# Patient Record
Sex: Female | Born: 1965 | Race: White | Hispanic: No | Smoking: Former smoker
Health system: Southern US, Community
[De-identification: ages and names within clinical notes are randomized; demographics above are authoritative.]

## PROBLEM LIST (undated history)

## (undated) DIAGNOSIS — R569 Unspecified convulsions: Secondary | ICD-10-CM

## (undated) DIAGNOSIS — C801 Malignant (primary) neoplasm, unspecified: Secondary | ICD-10-CM

## (undated) DIAGNOSIS — F419 Anxiety disorder, unspecified: Secondary | ICD-10-CM

## (undated) DIAGNOSIS — E7800 Pure hypercholesterolemia, unspecified: Secondary | ICD-10-CM

## (undated) DIAGNOSIS — R7303 Prediabetes: Secondary | ICD-10-CM

## (undated) DIAGNOSIS — H539 Unspecified visual disturbance: Secondary | ICD-10-CM

## (undated) DIAGNOSIS — R55 Syncope and collapse: Secondary | ICD-10-CM

## (undated) DIAGNOSIS — E663 Overweight: Secondary | ICD-10-CM

## (undated) HISTORY — DX: Unspecified visual disturbance: H53.9

## (undated) HISTORY — DX: Pure hypercholesterolemia, unspecified: E78.00

## (undated) HISTORY — DX: Malignant (primary) neoplasm, unspecified: C80.1

## (undated) HISTORY — DX: Overweight: E66.3

## (undated) HISTORY — DX: Anxiety disorder, unspecified: F41.9

## (undated) HISTORY — DX: Syncope and collapse: R55

## (undated) HISTORY — PX: BACK SURGERY: SHX140

## (undated) HISTORY — PX: FINGER SURGERY: SHX640

---

## 1999-05-07 ENCOUNTER — Other Ambulatory Visit: Admission: RE | Admit: 1999-05-07 | Discharge: 1999-05-07 | Payer: Self-pay | Admitting: Obstetrics and Gynecology

## 1999-06-27 ENCOUNTER — Ambulatory Visit (HOSPITAL_COMMUNITY): Admission: RE | Admit: 1999-06-27 | Discharge: 1999-06-27 | Payer: Self-pay | Admitting: Psychology

## 1999-06-27 ENCOUNTER — Encounter: Payer: Self-pay | Admitting: Obstetrics and Gynecology

## 1999-07-29 ENCOUNTER — Encounter: Payer: Self-pay | Admitting: Obstetrics and Gynecology

## 1999-07-29 ENCOUNTER — Ambulatory Visit (HOSPITAL_COMMUNITY): Admission: RE | Admit: 1999-07-29 | Discharge: 1999-07-29 | Payer: Self-pay | Admitting: Obstetrics and Gynecology

## 1999-11-27 ENCOUNTER — Inpatient Hospital Stay (HOSPITAL_COMMUNITY): Admission: AD | Admit: 1999-11-27 | Discharge: 1999-11-29 | Payer: Self-pay | Admitting: Obstetrics and Gynecology

## 1999-12-02 ENCOUNTER — Encounter: Admission: RE | Admit: 1999-12-02 | Discharge: 2000-01-27 | Payer: Self-pay | Admitting: Obstetrics and Gynecology

## 2001-01-06 ENCOUNTER — Other Ambulatory Visit: Admission: RE | Admit: 2001-01-06 | Discharge: 2001-01-06 | Payer: Self-pay | Admitting: Obstetrics and Gynecology

## 2001-02-23 ENCOUNTER — Ambulatory Visit (HOSPITAL_COMMUNITY): Admission: RE | Admit: 2001-02-23 | Discharge: 2001-02-23 | Payer: Self-pay | Admitting: Obstetrics and Gynecology

## 2001-02-23 ENCOUNTER — Encounter: Payer: Self-pay | Admitting: Obstetrics and Gynecology

## 2001-05-04 ENCOUNTER — Ambulatory Visit (HOSPITAL_COMMUNITY): Admission: RE | Admit: 2001-05-04 | Discharge: 2001-05-04 | Payer: Self-pay | Admitting: Obstetrics and Gynecology

## 2001-05-04 ENCOUNTER — Encounter: Payer: Self-pay | Admitting: Obstetrics and Gynecology

## 2001-06-11 ENCOUNTER — Ambulatory Visit (HOSPITAL_COMMUNITY): Admission: RE | Admit: 2001-06-11 | Discharge: 2001-06-11 | Payer: Self-pay | Admitting: Obstetrics and Gynecology

## 2001-06-11 ENCOUNTER — Encounter: Payer: Self-pay | Admitting: Obstetrics and Gynecology

## 2001-07-21 ENCOUNTER — Inpatient Hospital Stay (HOSPITAL_COMMUNITY): Admission: AD | Admit: 2001-07-21 | Discharge: 2001-07-24 | Payer: Self-pay | Admitting: Obstetrics and Gynecology

## 2002-05-10 ENCOUNTER — Other Ambulatory Visit: Admission: RE | Admit: 2002-05-10 | Discharge: 2002-05-10 | Payer: Self-pay | Admitting: Obstetrics and Gynecology

## 2004-03-08 ENCOUNTER — Other Ambulatory Visit: Admission: RE | Admit: 2004-03-08 | Discharge: 2004-03-08 | Payer: Self-pay | Admitting: Obstetrics and Gynecology

## 2004-12-25 ENCOUNTER — Ambulatory Visit: Payer: Self-pay | Admitting: Pulmonary Disease

## 2005-06-19 ENCOUNTER — Other Ambulatory Visit: Admission: RE | Admit: 2005-06-19 | Discharge: 2005-06-19 | Payer: Self-pay | Admitting: Obstetrics and Gynecology

## 2006-01-01 ENCOUNTER — Ambulatory Visit: Payer: Self-pay | Admitting: Pulmonary Disease

## 2007-01-06 ENCOUNTER — Ambulatory Visit: Payer: Self-pay | Admitting: Pulmonary Disease

## 2008-03-30 DIAGNOSIS — F411 Generalized anxiety disorder: Secondary | ICD-10-CM | POA: Insufficient documentation

## 2008-03-31 ENCOUNTER — Encounter: Payer: Self-pay | Admitting: Adult Health

## 2008-03-31 ENCOUNTER — Ambulatory Visit: Payer: Self-pay | Admitting: Internal Medicine

## 2008-03-31 DIAGNOSIS — E663 Overweight: Secondary | ICD-10-CM | POA: Insufficient documentation

## 2008-04-04 ENCOUNTER — Ambulatory Visit: Payer: Self-pay | Admitting: Adult Health

## 2008-04-05 LAB — CONVERTED CEMR LAB
ALT: 45 units/L — ABNORMAL HIGH (ref 0–35)
AST: 32 units/L (ref 0–37)
Albumin: 4.5 g/dL (ref 3.5–5.2)
Alkaline Phosphatase: 85 units/L (ref 39–117)
BUN: 12 mg/dL (ref 6–23)
Basophils Absolute: 0 10*3/uL (ref 0.0–0.1)
Basophils Relative: 0.3 % (ref 0.0–1.0)
Bilirubin, Direct: 0.1 mg/dL (ref 0.0–0.3)
CO2: 25 meq/L (ref 19–32)
Calcium: 9.5 mg/dL (ref 8.4–10.5)
Chloride: 105 meq/L (ref 96–112)
Creatinine, Ser: 0.6 mg/dL (ref 0.4–1.2)
Eosinophils Absolute: 0.1 10*3/uL (ref 0.0–0.7)
Eosinophils Relative: 1.4 % (ref 0.0–5.0)
GFR calc Af Amer: 142 mL/min
GFR calc non Af Amer: 118 mL/min
Glucose, Bld: 90 mg/dL (ref 70–99)
HCT: 43.6 % (ref 36.0–46.0)
Hemoglobin: 14.7 g/dL (ref 12.0–15.0)
Lymphocytes Relative: 27.1 % (ref 12.0–46.0)
MCHC: 30.3 g/dL (ref 30.0–36.0)
MCV: 90 fL (ref 78.0–100.0)
Monocytes Absolute: 0.4 10*3/uL (ref 0.1–1.0)
Monocytes Relative: 7.6 % (ref 3.0–12.0)
Neutro Abs: 3.7 10*3/uL (ref 1.4–7.7)
Neutrophils Relative %: 63.6 % (ref 43.0–77.0)
Platelets: 237 10*3/uL (ref 150–400)
Potassium: 3.9 meq/L (ref 3.5–5.1)
RBC: 4.85 M/uL (ref 3.87–5.11)
RDW: 12.3 % (ref 11.5–14.6)
Sodium: 135 meq/L (ref 135–145)
TSH: 1.4 microintl units/mL (ref 0.35–5.50)
Total Bilirubin: 0.8 mg/dL (ref 0.3–1.2)
Total Protein: 8 g/dL (ref 6.0–8.3)
Vit D, 1,25-Dihydroxy: 24 — ABNORMAL LOW (ref 30–89)
WBC: 5.7 10*3/uL (ref 4.5–10.5)

## 2008-11-22 ENCOUNTER — Encounter (INDEPENDENT_AMBULATORY_CARE_PROVIDER_SITE_OTHER): Payer: Self-pay | Admitting: *Deleted

## 2009-03-16 ENCOUNTER — Ambulatory Visit: Payer: Self-pay | Admitting: Pulmonary Disease

## 2009-04-13 ENCOUNTER — Ambulatory Visit: Payer: Self-pay | Admitting: Pulmonary Disease

## 2009-04-14 LAB — CONVERTED CEMR LAB
ALT: 22 units/L (ref 0–35)
AST: 20 units/L (ref 0–37)
Albumin: 4.2 g/dL (ref 3.5–5.2)
Alkaline Phosphatase: 52 units/L (ref 39–117)
BUN: 10 mg/dL (ref 6–23)
Basophils Absolute: 0 10*3/uL (ref 0.0–0.1)
Basophils Relative: 0.1 % (ref 0.0–3.0)
Bilirubin Urine: NEGATIVE
Bilirubin, Direct: 0.2 mg/dL (ref 0.0–0.3)
CO2: 28 meq/L (ref 19–32)
Calcium: 9 mg/dL (ref 8.4–10.5)
Chloride: 108 meq/L (ref 96–112)
Cholesterol: 185 mg/dL (ref 0–200)
Creatinine, Ser: 0.6 mg/dL (ref 0.4–1.2)
Eosinophils Absolute: 0.1 10*3/uL (ref 0.0–0.7)
Eosinophils Relative: 1.3 % (ref 0.0–5.0)
GFR calc non Af Amer: 116.76 mL/min (ref >60)
Glucose, Bld: 93 mg/dL (ref 70–99)
HCT: 40.9 % (ref 36.0–46.0)
HDL: 41.9 mg/dL (ref >39.00)
Hemoglobin, Urine: NEGATIVE
Hemoglobin: 14.3 g/dL (ref 12.0–15.0)
Ketones, ur: NEGATIVE mg/dL
LDL Cholesterol: 121 mg/dL — ABNORMAL HIGH (ref 0–99)
Leukocytes, UA: NEGATIVE
Lymphocytes Relative: 21.7 % (ref 12.0–46.0)
Lymphs Abs: 1.3 10*3/uL (ref 0.7–4.0)
MCHC: 34.8 g/dL (ref 30.0–36.0)
MCV: 89.1 fL (ref 78.0–100.0)
Monocytes Absolute: 0.5 10*3/uL (ref 0.1–1.0)
Monocytes Relative: 8.5 % (ref 3.0–12.0)
Neutro Abs: 4.2 10*3/uL (ref 1.4–7.7)
Neutrophils Relative %: 68.4 % (ref 43.0–77.0)
Nitrite: NEGATIVE
Platelets: 184 10*3/uL (ref 150.0–400.0)
Potassium: 3.3 meq/L — ABNORMAL LOW (ref 3.5–5.1)
RBC: 4.59 M/uL (ref 3.87–5.11)
RDW: 11.9 % (ref 11.5–14.6)
Sodium: 141 meq/L (ref 135–145)
Specific Gravity, Urine: <=1.005 (ref 1.000–1.030)
TSH: 1.07 microintl units/mL (ref 0.35–5.50)
Total Bilirubin: 0.9 mg/dL (ref 0.3–1.2)
Total CHOL/HDL Ratio: 4
Total Protein, Urine: NEGATIVE mg/dL
Total Protein: 7.4 g/dL (ref 6.0–8.3)
Triglycerides: 111 mg/dL (ref 0.0–149.0)
Urine Glucose: NEGATIVE mg/dL
Urobilinogen, UA: 0.2 (ref 0.0–1.0)
VLDL: 22.2 mg/dL (ref 0.0–40.0)
WBC: 6.1 10*3/uL (ref 4.5–10.5)
pH: 7 (ref 5.0–8.0)

## 2009-11-05 ENCOUNTER — Telehealth: Payer: Self-pay | Admitting: Pulmonary Disease

## 2010-01-04 ENCOUNTER — Encounter: Admission: RE | Admit: 2010-01-04 | Discharge: 2010-01-04 | Payer: Self-pay | Admitting: Obstetrics and Gynecology

## 2010-07-05 ENCOUNTER — Ambulatory Visit: Payer: Self-pay | Admitting: Pulmonary Disease

## 2010-07-05 DIAGNOSIS — E785 Hyperlipidemia, unspecified: Secondary | ICD-10-CM | POA: Insufficient documentation

## 2010-07-06 DIAGNOSIS — E559 Vitamin D deficiency, unspecified: Secondary | ICD-10-CM | POA: Insufficient documentation
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
## 2011-01-15 ENCOUNTER — Telehealth: Payer: Self-pay | Admitting: Pulmonary Disease

## 2011-01-21 NOTE — Progress Notes (Signed)
Summary: Alprazolam refill request  Phone Note Refill Request Message from:  Fax from Pharmacy on January 15, 2011 10:36 AM  Refills Requested: Medication #1:  ALPRAZOLAM 0.5 MG  TABS Take 1/2-1 tablet two times a day as needed.   Dosage confirmed as above?Dosage Confirmed   Brand Name Necessary? No   Supply Requested: 1 month   Last Refilled: 11/28/2010   Notes: [facility] patient [facility] [place]  ([place]) [phone]   Method Requested: Telephone to Pharmacy Next Appointment Scheduled: 03/07/2011 w/ SN Initial call taken by: Michel Bickers CMA,  January 15, 2011 10:37 AM  Follow-up for Phone Call        Please advise if okay to send refills.Michel Bickers CMA  January 15, 2011 10:38 AM    Prescriptions: ALPRAZOLAM 0.5 MG  TABS (ALPRAZOLAM) Take 1/2-1 tablet two times a day as needed  #100 x 5   Entered by:   Randell Loop CMA   Authorized by:   Michele Mcalpine MD   Signed by:   Randell Loop CMA on 01/15/2011   Method used:   Historical   RxID:   [id number]  rx has been faxed back to the pharmacy with 5 refills Randell Loop [facility]  January 15, 2011 12:50 PM
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
## 2011-03-28 NOTE — Op Note (Signed)
[facility]  Patient:    Misty Howell, Misty Howell Visit Number: [id number] MRN: [id number]          Service Type: OBS Location: 910B 9161 01 Attending Physician:  Oliver Pila Dictated by:   Alvino Chapel, M.D. Proc. Date: 07/21/01 Admit Date:  07/21/2001                             Operative Report  PREOPERATIVE DIAGNOSES:       1. Term pregnancy at 39 weeks.                               2. Failed induction.                               3. Suspected macrosomia.                               4. Transverse lie of fetus with elbow                                  presenting.  POSTOPERATIVE DIAGNOSES:      1. Term pregnancy at 39 weeks.                               2. Failed induction.                               3. Suspected macrosomia.                               4. Transverse lie of fetus with elbow                                  presenting.  PROCEDURE:                    Low transverse cesarean section with                               extension T of the uterus to a classical                               incision.  SURGEON:                      Alvino Chapel, M.D.  ANESTHESIA:                   Epidural.  FINDINGS:                     The infant was in a very compound presentation with the elbow presenting and the head and upper torso flexed upwards to the maternal right. As the torso could not be deflexed to pull the head to incision and the feet and bottom were not reachable to effect a  breech delivery, the uterus was T-ed and the head delivered in the flexed position. It was a female infant, 9 pounds 12 ounces. Apgars were 3 and 8 and he responded well to blow by oxygen with pediatrics present. Normal tubes and ovaries were noted. There were two small fibroids on the posterior surface of the uterus.  ESTIMATED BLOOD LOSS:         800 cc.  INTRAVENOUS FLUIDS:           2000 cc.  URINE OUTPUT:                  Approximately 100 cc clear urine.  DESCRIPTION OF PROCEDURE:     The patient was taken to the operating room after vaginal exam revealed a presenting arm and ultrasound confirmed the baby in a transverse lie with an elbow presenting. At that time, she was 3-4 cm dilated. She was prepped and draped in the normal sterile fashion and epidural found to be adequate by Allis clamp test. A Pfannenstiel skin incision was then made through the skin and carried through the underlying layer of fascia by sharp dissection and Bovie cautery. The fascia was then nicked in the midline and the incision was extended laterally with Mayo scissors. The inferior aspect of the incision was grasped with Kocher clamps, elevated, and dissected off the underlying rectus muscles. In a similar fashion, the superior aspect was elevated and dissected off the underlying rectus muscles. The rectus muscles were separated in the midline and the peritoneum was opened. The peritoneal incision was then extended both superiorly and inferiorly with careful attention to avoid both bowel and bladder. The bladder blade was then inserted. The vesicouterine peritoneum was identified and the bladder flap created with Metzenbaum scissors. The uterus was then incised in a transverse fashion and an arm introduced into the uterus to assess fetal position. As described, the fetus was very difficult to manipulate with the upper torso flexed upward to the maternal right. This could not be deflexed to effect delivery of the head after three minutes of trying; therefore, the uterine incision was extended in the midline vertically and at that point, the head was able to be delivered from its position. The remainder of the infant delivered, cord was clamped and cut, and the infant handed to the awaiting pediatricians. Cord blood was obtained and a cord gas was attempted. The placenta was then delivered without difficulty. The uterus was cleared  of all clots and debris with a moist lap sponge. The uterus was then exteriorized and the incision edges carefully demarcated with ring forceps. The classical vertical portion of the uterine incision was then closed in a multi-layer approach of 0 chromic in a running locked fashion in two separate layers. A third layer of 2-0 Vicryl was placed in the peritoneal surface to bury the chromic suture. The transverse portion of the incision was then closed in the usual fashion in a running locked fashion of 0 chromic. There was a small area of bleeding noted in the right angle and this was controlled with two figure-of-eight sutures. The incision was then found to be hemostatic and the uterus was returned to the abdomen. It was again inspected and irrigation of the abdomen performed with the uterus in its anatomical position. All clots and debris were removed from the abdomen and the uterus was well contracted and the incision was hemostatic; therefore, the rectus muscles were reapproximated with an interrupted stitch of 1-0 chromic and the fascia  was closed with O Vicryl in a running fashion. The subcutaneous tissue was reapproximated with a running suture of 2-0 Vicryl and the skin was closed with staples. Sponge lap and needle counts were correct x 2 and the patient was taken to the recovery room in stable condition. Dictated by:   Alvino Chapel, M.D. Attending Physician:  Oliver Pila DD:  07/21/01 TD:  07/22/01 Job: [id number]

## 2011-03-28 NOTE — Discharge Summary (Signed)
[facility]  PatientKAMA Howell                       MRN: [id number] Adm. Date:  [id number] Disc. Date: [id number] Attending:  Michaele Offer                           Discharge Summary  DISCHARGE DIAGNOSES:          1. Term pregnancy at 39+ weeks, delivered.                               2. Thick meconium in labor.                               3. Group B Streptococcus-positive status.                               4. Status post low forceps vaginal delivery.  DISCHARGE MEDICATIONS:        1. Motrin 600 mg p.o. every six hours.                               2. Colace 100 mg p.o. b.i.d.                               3. Prenatal vitamins 1 p.o. daily.  FOLLOW-UP:                    The patient is to follow up in our office in approximately six weeks for her routine postpartum exam.  HOSPITAL COURSE:              The patient is a 45 year old G2, P0-0-1-0, who is  admitted at 39-6/7 weeks with regular contractions every two to three minutes. She had no leakage of fluid, no vaginal bleeding, and good fetal movement.  Her pregnancy had been uncomplicated except for rubella nonimmune status as well as  positive group B strep culture.  Prenatal laboratories were solid at A positive, antibody negative, VDRL negative, rubella nonimmune.  Hepatitis B surface antigen negative.  HIV negative.  GC negative, chlamydia negative.  Group B strep positive.  PAST OBSTETRICAL HISTORY:     Therapeutic abortion at approximately six weeks previously.  PAST GYNECOLOGICAL HISTORY:   History of cryotherapy in 1994.  PAST SURGICAL HISTORY:        Hand surgery at 45 years old.  PAST MEDICAL HISTORY:         History of broken back at age 33 in a sledding                               accident.  ALLERGIES:                    No known drug allergies.  MEDICATIONS:                  Prenatal vitamins only.  PHYSICAL EXAMINATION:         On admission  she was afebrile with stable vital signs.  Fetal  heart rate was reactive.  Cervical exam on admission was 80% effaced, 1 cm dilated, with a bulging bag.  The patient was very uncomfortable with her contractions, therefore, was admitted admitted for pain control and group B strep prophylaxis.  HOSPITAL COURSE:              The patient had assisted rupture of membranes after admission, with thick meconium noted.  An amniotic infusion was begun through an intrauterine pressure catheter.  She did receive penicillin prophylaxis for her  group B strep status.  She continued to progress well, and reached complete dilation, +1 station.  The patient then pushed for about 2-1/2 hours with progression of station to completely dilated, completely effaced, and +2, with  direct OA presentation.  Fetal heart rate was noted to be tachycardic, but was reassuring overall, with some variables down to the 80s for approximately 10 minutes prior to the decision to proceed with a low forceps vaginal delivery. he patient had a mild maternal temperature of 100.3.  The decision was made to proceed with low forceps vaginal delivery given maternal exhaustion and the tachycardic  fetal heart rate with deepening variables.  The Luikart forceps were easily applied to the OA position, and the infant was delivered in two easy pulls.  There was  nuchal cord x 1 which was reduced easily.  The infant was DeLee suctioned for thick meconium.  Apgars were 6 and 9, weight was 8 pounds 1 ounce, vigorous female infant. There was a third degree perineal laceration which was repaired with 2-0 and 3-0 Vicryl.  Cervix and rectum were intact.  Estimated blood loss was 450 cc. Pediatrics was present at delivery to evaluate the baby, and a cord pH obtained at the time of delivery was 7.26.  The patient was then admitted with routine postpartum care.  She did well, and on postpartum day #2 she was afebrile, with  stable  vital signs.  Her pain was controlled with Motrin.  Therefore, she was discharged to home, with follow-up as previously stated. DD:  12/14/99 TD:  12/15/99 Job: [id number]

## 2011-07-07 ENCOUNTER — Ambulatory Visit (INDEPENDENT_AMBULATORY_CARE_PROVIDER_SITE_OTHER): Payer: Self-pay | Admitting: Pulmonary Disease

## 2011-07-07 ENCOUNTER — Encounter: Payer: Self-pay | Admitting: Pulmonary Disease

## 2011-07-07 DIAGNOSIS — F411 Generalized anxiety disorder: Secondary | ICD-10-CM

## 2011-07-07 DIAGNOSIS — Z0000 Encounter for general adult medical examination without abnormal findings: Secondary | ICD-10-CM

## 2011-07-07 DIAGNOSIS — E663 Overweight: Secondary | ICD-10-CM

## 2011-07-07 DIAGNOSIS — E559 Vitamin D deficiency, unspecified: Secondary | ICD-10-CM

## 2011-07-07 DIAGNOSIS — E785 Hyperlipidemia, unspecified: Secondary | ICD-10-CM

## 2011-07-07 MED ORDER — ALPRAZOLAM 0.5 MG PO TABS: ORAL_TABLET | ORAL | Status: DC
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
## 2011-07-07 NOTE — Patient Instructions (Signed)
Today we updated your med list in EPIC...    We refilled your Alprazolam as requested...  Please return to our lab one morning soon for your follow up fasting blood work...    Then call the [facility] in a few days for your results...    Dial [id number] & when prompted enter your patient number followed by the # symbol...    Your patient number is:   [id number]  Call for any problems...  Let's plan a follow up visit in 1 year, sooner if needed for any reason.Marland KitchenMarland Kitchen

## 2012-02-23 ENCOUNTER — Telehealth: Payer: Self-pay | Admitting: Pulmonary Disease

## 2012-02-23 MED ORDER — ALPRAZOLAM 0.5 MG PO TABS: ORAL_TABLET | ORAL | Status: DC

## 2012-02-23 NOTE — Telephone Encounter (Signed)
Refill sent. Pt is aware. Tinzley Dalia, CMA  

## 2012-04-07 ENCOUNTER — Ambulatory Visit (INDEPENDENT_AMBULATORY_CARE_PROVIDER_SITE_OTHER): Payer: Self-pay | Admitting: Pulmonary Disease

## 2012-04-07 ENCOUNTER — Encounter: Payer: Self-pay | Admitting: Pulmonary Disease

## 2012-04-07 VITALS — BP 116/84 | HR 88 | Temp 97.0°F | Ht 64.0 in | Wt 180.2 lb

## 2012-04-07 DIAGNOSIS — F411 Generalized anxiety disorder: Secondary | ICD-10-CM

## 2012-04-07 DIAGNOSIS — E785 Hyperlipidemia, unspecified: Secondary | ICD-10-CM

## 2012-04-07 DIAGNOSIS — E663 Overweight: Secondary | ICD-10-CM

## 2012-04-07 MED ORDER — ALPRAZOLAM 0.5 MG PO TABS: ORAL_TABLET | ORAL | Status: DC
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
## 2012-04-07 NOTE — Patient Instructions (Signed)
Today we updated your med list in our EPIC system...    Continue your current medications the same...    We refilled your alprazolam as requested...  For your Calcium supplement> try the little choc square VIACTIV daily...  At your convenience> please return to our lab one morning for your FASTING blood work...    We will call you w/ the results...  Let's get on track w/ our diet & exercise program...    Call for any problems.Marland KitchenMarland Kitchen

## 2012-10-21 ENCOUNTER — Other Ambulatory Visit: Payer: Self-pay | Admitting: Pulmonary Disease

## 2012-10-26 ENCOUNTER — Telehealth: Payer: Self-pay | Admitting: Pulmonary Disease

## 2012-10-26 ENCOUNTER — Other Ambulatory Visit: Payer: Self-pay | Admitting: Pulmonary Disease

## 2012-10-26 NOTE — Telephone Encounter (Signed)
Alprazolam 0.5mg  <> take 1/2 to 1 talbet by mouth 2 times a day as needed  #60 x5 Last fill 09-21-2012 No Known Allergies Dr Kriste Basque is this ok to fill  Thank you

## 2012-10-26 NOTE — Telephone Encounter (Signed)
Refill sent in on 10-2012

## 2012-10-26 NOTE — Telephone Encounter (Signed)
Dup.closed out first one by mistake   Alprazolam 0.5 mg <> 1/2 to 1 tablet by mouth 2 times a day as needed  #60 X5 Last fill 09-21-12 No Known Allergies Dr Kriste Basque please advise  Thank you

## 2012-11-30 ENCOUNTER — Ambulatory Visit: Payer: Self-pay | Admitting: Pulmonary Disease

## 2013-01-05 ENCOUNTER — Other Ambulatory Visit: Payer: Self-pay | Admitting: Pulmonary Disease

## 2013-01-07 ENCOUNTER — Telehealth: Payer: Self-pay | Admitting: Pulmonary Disease

## 2013-01-07 MED ORDER — ALPRAZOLAM 0.5 MG PO TABS: ORAL_TABLET | ORAL | Status: DC

## 2013-01-07 NOTE — Telephone Encounter (Signed)
rx has been called in. Nothing further was needed

## 2013-02-03 ENCOUNTER — Ambulatory Visit (INDEPENDENT_AMBULATORY_CARE_PROVIDER_SITE_OTHER): Payer: No Typology Code available for payment source | Admitting: Pulmonary Disease

## 2013-02-03 ENCOUNTER — Encounter: Payer: Self-pay | Admitting: Pulmonary Disease

## 2013-02-03 VITALS — BP 120/70 | HR 77 | Temp 97.0°F | Ht 64.0 in | Wt 198.8 lb

## 2013-02-03 DIAGNOSIS — E785 Hyperlipidemia, unspecified: Secondary | ICD-10-CM

## 2013-02-03 DIAGNOSIS — E663 Overweight: Secondary | ICD-10-CM

## 2013-02-03 DIAGNOSIS — F411 Generalized anxiety disorder: Secondary | ICD-10-CM

## 2013-02-03 DIAGNOSIS — E559 Vitamin D deficiency, unspecified: Secondary | ICD-10-CM

## 2013-02-03 MED ORDER — ALPRAZOLAM 0.5 MG PO TABS: ORAL_TABLET | ORAL | Status: DC
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
## 2013-02-03 NOTE — Patient Instructions (Addendum)
Today we updated your med list in our EPIC system...    Continue your current medications the same...    We refilled your alprazolam per your request...  Please consider scheduling a CPX complete w/ CXR, EKG, & FASTING blood work at [facility]...  Call for any questions...  Let's plan a follow up visit in 45mo, sooner if needed for problems.Marland KitchenMarland Kitchen

## 2013-06-24 ENCOUNTER — Encounter: Payer: Self-pay | Admitting: Pulmonary Disease

## 2013-09-23 ENCOUNTER — Other Ambulatory Visit: Payer: Self-pay | Admitting: Pulmonary Disease

## 2013-09-26 ENCOUNTER — Telehealth: Payer: Self-pay | Admitting: Pulmonary Disease

## 2013-09-26 MED ORDER — ALPRAZOLAM 0.5 MG PO TABS: ORAL_TABLET | ORAL | Status: DC

## 2013-09-26 NOTE — Telephone Encounter (Signed)
I have called RX into the [facility] in [place]. Pt is aware nothing needed

## 2013-11-14 ENCOUNTER — Ambulatory Visit: Payer: Self-pay | Admitting: Pulmonary Disease

## 2013-11-24 ENCOUNTER — Emergency Department (HOSPITAL_COMMUNITY): Payer: No Typology Code available for payment source

## 2013-11-24 ENCOUNTER — Observation Stay (HOSPITAL_COMMUNITY)
Admission: EM | Admit: 2013-11-24 | Discharge: 2013-11-24 | Disposition: A | Discharge status: Home / Self Care | Payer: No Typology Code available for payment source | Attending: Internal Medicine | Admitting: Internal Medicine

## 2013-11-24 ENCOUNTER — Encounter (HOSPITAL_COMMUNITY): Payer: Self-pay | Admitting: Emergency Medicine

## 2013-11-24 DIAGNOSIS — E663 Overweight: Secondary | ICD-10-CM | POA: Insufficient documentation

## 2013-11-24 DIAGNOSIS — F411 Generalized anxiety disorder: Secondary | ICD-10-CM | POA: Insufficient documentation

## 2013-11-24 DIAGNOSIS — R569 Unspecified convulsions: Secondary | ICD-10-CM

## 2013-11-24 DIAGNOSIS — Z87891 Personal history of nicotine dependence: Secondary | ICD-10-CM | POA: Insufficient documentation

## 2013-11-24 DIAGNOSIS — R55 Syncope and collapse: Secondary | ICD-10-CM | POA: Diagnosis present

## 2013-11-24 DIAGNOSIS — E7800 Pure hypercholesterolemia, unspecified: Secondary | ICD-10-CM | POA: Insufficient documentation

## 2013-11-24 DIAGNOSIS — R404 Transient alteration of awareness: Principal | ICD-10-CM | POA: Insufficient documentation

## 2013-11-24 DIAGNOSIS — E559 Vitamin D deficiency, unspecified: Secondary | ICD-10-CM | POA: Insufficient documentation

## 2013-11-24 LAB — URINALYSIS, ROUTINE W REFLEX MICROSCOPIC
Glucose, UA: NEGATIVE mg/dL
Hgb urine dipstick: NEGATIVE
Ketones, ur: 40 mg/dL — AB
Leukocytes, UA: NEGATIVE
NITRITE: NEGATIVE
PH: 5 (ref 5.0–8.0)
Protein, ur: 100 mg/dL — AB
SPECIFIC GRAVITY, URINE: 1.028 (ref 1.005–1.030)
Urobilinogen, UA: 1 mg/dL (ref 0.0–1.0)

## 2013-11-24 LAB — CBC
HEMATOCRIT: 44.5 % (ref 36.0–46.0)
HEMOGLOBIN: 15.7 g/dL — AB (ref 12.0–15.0)
MCH: 31.4 pg (ref 26.0–34.0)
MCHC: 35.3 g/dL (ref 30.0–36.0)
MCV: 89 fL (ref 78.0–100.0)
Platelets: 250 10*3/uL (ref 150–400)
RBC: 5 MIL/uL (ref 3.87–5.11)
RDW: 12.6 % (ref 11.5–15.5)
WBC: 9.2 10*3/uL (ref 4.0–10.5)

## 2013-11-24 LAB — POCT I-STAT, CHEM 8
BUN: 14 mg/dL (ref 6–23)
CHLORIDE: 100 meq/L (ref 96–112)
Calcium, Ion: 1.14 mmol/L (ref 1.12–1.23)
Creatinine, Ser: 0.8 mg/dL (ref 0.50–1.10)
Glucose, Bld: 210 mg/dL — ABNORMAL HIGH (ref 70–99)
HEMATOCRIT: 50 % — AB (ref 36.0–46.0)
Hemoglobin: 17 g/dL — ABNORMAL HIGH (ref 12.0–15.0)
Potassium: 3.9 mEq/L (ref 3.7–5.3)
SODIUM: 136 meq/L — AB (ref 137–147)
TCO2: 19 mmol/L (ref 0–100)

## 2013-11-24 LAB — COMPREHENSIVE METABOLIC PANEL
ALK PHOS: 102 U/L (ref 39–117)
ALT: 18 U/L (ref 0–35)
AST: 16 U/L (ref 0–37)
Albumin: 4.7 g/dL (ref 3.5–5.2)
BILIRUBIN TOTAL: 0.6 mg/dL (ref 0.3–1.2)
BUN: 14 mg/dL (ref 6–23)
CHLORIDE: 91 meq/L — AB (ref 96–112)
CO2: 17 meq/L — AB (ref 19–32)
Calcium: 9.7 mg/dL (ref 8.4–10.5)
Creatinine, Ser: 0.67 mg/dL (ref 0.50–1.10)
GFR calc Af Amer: >90 mL/min (ref >90)
GLUCOSE: 209 mg/dL — AB (ref 70–99)
POTASSIUM: 4 meq/L (ref 3.7–5.3)
Sodium: 135 mEq/L — ABNORMAL LOW (ref 137–147)
Total Protein: 8.9 g/dL — ABNORMAL HIGH (ref 6.0–8.3)

## 2013-11-24 LAB — RAPID URINE DRUG SCREEN, HOSP PERFORMED
Amphetamines: NOT DETECTED
BARBITURATES: NOT DETECTED
Benzodiazepines: POSITIVE — AB
Cocaine: NOT DETECTED
Opiates: NOT DETECTED
TETRAHYDROCANNABINOL: NOT DETECTED

## 2013-11-24 LAB — DIFFERENTIAL
Basophils Absolute: 0 10*3/uL (ref 0.0–0.1)
Basophils Relative: 0 % (ref 0–1)
EOS ABS: 0 10*3/uL (ref 0.0–0.7)
Eosinophils Relative: 0 % (ref 0–5)
Lymphocytes Relative: 23 % (ref 12–46)
Lymphs Abs: 2.1 10*3/uL (ref 0.7–4.0)
MONO ABS: 0.8 10*3/uL (ref 0.1–1.0)
MONOS PCT: 8 % (ref 3–12)
Neutro Abs: 6.3 10*3/uL (ref 1.7–7.7)
Neutrophils Relative %: 69 % (ref 43–77)

## 2013-11-24 LAB — POCT I-STAT TROPONIN I: TROPONIN I, POC: 0 ng/mL (ref 0.00–0.08)

## 2013-11-24 LAB — PROTIME-INR
INR: 1.05 (ref 0.00–1.49)
Prothrombin Time: 13.5 seconds (ref 11.6–15.2)

## 2013-11-24 LAB — URINE MICROSCOPIC-ADD ON

## 2013-11-24 LAB — TROPONIN I: Troponin I: <0.3 ng/mL (ref <0.30)

## 2013-11-24 LAB — ETHANOL

## 2013-11-24 LAB — APTT: aPTT: 24 seconds (ref 24–37)

## 2013-11-24 LAB — GLUCOSE, CAPILLARY: Glucose-Capillary: 173 mg/dL — ABNORMAL HIGH (ref 70–99)

## 2013-11-24 MED ORDER — GADOBENATE DIMEGLUMINE 529 MG/ML IV SOLN
15.0000 mL | Freq: Once | INTRAVENOUS | Status: AC
  Administered 2013-11-24: 14 mL via INTRAVENOUS

## 2013-11-24 MED ORDER — GADOBENATE DIMEGLUMINE 529 MG/ML IV SOLN: 15.0000 mL | Freq: Once | INTRAVENOUS | Status: DC | PRN

## 2013-11-24 NOTE — Procedures (Signed)
ELECTROENCEPHALOGRAM REPORT   Patient: Misty Howell       Room #: C27 EEG No. ID: [id number] Age: 48 y.o.        Sex: female Referring Physician: Adele Barthel Report Date:  11/24/2013        Interpreting Physician: Alexis Goodell D  History: ETHNE JEON is an 48 y.o. female with new onset seizure  Medications:  ALPRAZolam (XANAX) 0.5 MG tablet, Take 0.25-0.5 mg by mouth 2 (two) times daily as needed for anxiety., Disp: , Rfl: ;   calcium carbonate (OS-CAL) 600 MG TABS, Take 600 mg by mouth daily.  , Disp: , Rfl: ;   cholecalciferol (VITAMIN D) 1000 UNITS tablet, Take 1,000 Units by mouth daily.  , Disp: , Rfl: ;   Multiple Vitamins-Minerals (MULTIVITAMIN & MINERAL PO), Take 1 tablet by mouth daily.  , Disp: , Rfl:   Conditions of Recording:  This is a 16 channel EEG carried out with the patient in the awake and anxious state.  Description:  The waking background activity consists of a low voltage, symmetrical, fairly well organized, 10 Hz alpha activity, seen from the parieto-occipital and posterior temporal regions.  Low voltage fast activity, poorly organized, is seen anteriorly and is at times superimposed on more posterior regions.  A mixture of theta and alpha rhythms are seen from the central and temporal regions. The patient does not drowse or sleep. Hyperventilation produced a mild to moderate buildup but failed to elicit any abnormalities.  Intermittent photic stimulation was performed but failed to illicit any change in the tracing.    IMPRESSION: Normal awake electroencephalogram with activation procedures. There are no focal lateralizing or epileptiform features.  Comment:  An EEG with the patient sleep deprived to elicit drowse and light sleep may be desirable to further elicit a possible seizure disorder.     Alexis Goodell, MD [facility] [phone] 11/24/2013, 4:28 PM

## 2013-11-24 NOTE — ED Provider Notes (Signed)
Neurologist, Dr. Doy Mince request for pt to have further work up to r/o seizure.  She felt pt may need brain MRI, EEG along with seizure work up.  Will relay message to Dr. Curly Rim.    Domenic Moras, PA-C 11/24/13 1546

## 2013-11-24 NOTE — Consult Note (Signed)
Requesting physician: Elmer Sow, EDP  Primary Care Physician: No primary provider on file.  Reason for consultation: Syncope, ? seizure   History of Present Illness: 48 y/o woman with past medical history significant only for anxiety disorder for which he has been prescribed Xanax for the past 2-3 years of which she takes 0.5-1 mg every day. She presents to the hospital today as a code stroke. It appears she was at work when one of her coworkers heard her fall to the floor in the cubicle next to him. He found her shaking and went to get another coworker, by the time they returned about 3 minutes later she was found to be still and not responding to stimuli. It took her about an hour to 2 hours to return to her normal. She gives the history is that she has not been taking her Xanax over the past 3 days but had been taking routinely 0.5-1 mg every day for the past 2-3 years. She has been seen in consultation by neurology who recommended an EEG and an MRI that have already been completed. EDP  requested a hospitalist consultation for potential admission.  Allergies:  No Known Allergies    Past Medical History  Diagnosis Date  . Hypercholesterolemia   . Overweight   . Vitamin D deficiency   . Anxiety     Past Surgical History  Procedure Laterality Date  . Back surgery      Scheduled Meds:  Continuous Infusions:  PRN Meds:.gadobenate dimeglumine  Social History:  reports that she quit smoking about 25 years ago. She does not have any smokeless tobacco history on file. She reports that she drinks alcohol. She reports that she does not use illicit drugs.  Family History  Problem Relation Age of Onset  . Hypertension Mother   . Migraines Mother   . Hypertension Father     Review of Systems:  Constitutional: Denies fever, chills, diaphoresis, appetite change and fatigue.  HEENT: Denies photophobia, eye pain, redness, hearing loss, ear pain, congestion, sore throat, rhinorrhea,  sneezing, mouth sores, trouble swallowing, neck pain, neck stiffness and tinnitus.   Respiratory: Denies SOB, DOE, cough, chest tightness,  and wheezing.   Cardiovascular: Denies chest pain, palpitations and leg swelling.  Gastrointestinal: Denies nausea, vomiting, abdominal pain, diarrhea, constipation, blood in stool and abdominal distention.  Genitourinary: Denies dysuria, urgency, frequency, hematuria, flank pain and difficulty urinating.  Endocrine: Denies: hot or cold intolerance, sweats, changes in hair or nails, polyuria, polydipsia. Musculoskeletal: Denies myalgias, back pain, joint swelling, arthralgias and gait problem.  Skin: Denies pallor, rash and wound.  Neurological: Denies dizziness, weakness, light-headedness, numbness and headaches.  Hematological: Denies adenopathy. Easy bruising, personal or family bleeding history  Psychiatric/Behavioral: Denies suicidal ideation, mood changes, confusion, nervousness, sleep disturbance and agitation   Physical Exam: Blood pressure 135/95, pulse 88, temperature 97.5 F (36.4 C), resp. rate 20, last menstrual period 11/07/2013, SpO2 99.00%. General: Alert, awake, oriented x3, in no distress. HEENT: Normocephalic, atraumatic, pupils equal round and reactive to light, extraocular movements intact. Neck: Supple, no JVD, no lymphadenopathy, no bruits, no goiter. Cardiovascular: Regular rate and rhythm, I do not auscultate any murmurs, rubs or gallops. Lungs: Clear to auscultation bilaterally. Abdomen: Soft, nontender, nondistended, positive bowel sounds, no masses or organomegaly noted. Extremities: No clubbing, cyanosis or edema, positive pedal pulses. Neurologic: Currently intact and nonfocal.  Labs on Admission:  Results for orders placed during the hospital encounter of 11/24/13 (from the past 48 hour(s))  ETHANOL  Status: None   Collection Time    11/24/13 10:40 AM      Result Value Range   Alcohol, Ethyl (B) <11  0 - 11 mg/dL    Comment:            LOWEST DETECTABLE LIMIT FOR     SERUM ALCOHOL IS 11 mg/dL     FOR MEDICAL PURPOSES ONLY  PROTIME-INR     Status: None   Collection Time    11/24/13 10:40 AM      Result Value Range   Prothrombin Time 13.5  11.6 - 15.2 seconds   INR 1.05  0.00 - 1.49  APTT     Status: None   Collection Time    11/24/13 10:40 AM      Result Value Range   aPTT 24  24 - 37 seconds  CBC     Status: Abnormal   Collection Time    11/24/13 10:40 AM      Result Value Range   WBC 9.2  4.0 - 10.5 K/uL   RBC 5.00  3.87 - 5.11 MIL/uL   Hemoglobin 15.7 (*) 12.0 - 15.0 g/dL   HCT 44.5  36.0 - 46.0 %   MCV 89.0  78.0 - 100.0 fL   MCH 31.4  26.0 - 34.0 pg   MCHC 35.3  30.0 - 36.0 g/dL   RDW 12.6  11.5 - 15.5 %   Platelets 250  150 - 400 K/uL  DIFFERENTIAL     Status: None   Collection Time    11/24/13 10:40 AM      Result Value Range   Neutrophils Relative % 69  43 - 77 %   Neutro Abs 6.3  1.7 - 7.7 K/uL   Lymphocytes Relative 23  12 - 46 %   Lymphs Abs 2.1  0.7 - 4.0 K/uL   Monocytes Relative 8  3 - 12 %   Monocytes Absolute 0.8  0.1 - 1.0 K/uL   Eosinophils Relative 0  0 - 5 %   Eosinophils Absolute 0.0  0.0 - 0.7 K/uL   Basophils Relative 0  0 - 1 %   Basophils Absolute 0.0  0.0 - 0.1 K/uL  COMPREHENSIVE METABOLIC PANEL     Status: Abnormal   Collection Time    11/24/13 10:40 AM      Result Value Range   Sodium 135 (*) 137 - 147 mEq/L   Potassium 4.0  3.7 - 5.3 mEq/L   Chloride 91 (*) 96 - 112 mEq/L   CO2 17 (*) 19 - 32 mEq/L   Glucose, Bld 209 (*) 70 - 99 mg/dL   BUN 14  6 - 23 mg/dL   Creatinine, Ser 0.67  0.50 - 1.10 mg/dL   Calcium 9.7  8.4 - 10.5 mg/dL   Total Protein 8.9 (*) 6.0 - 8.3 g/dL   Albumin 4.7  3.5 - 5.2 g/dL   AST 16  0 - 37 U/L   ALT 18  0 - 35 U/L   Alkaline Phosphatase 102  39 - 117 U/L   Total Bilirubin 0.6  0.3 - 1.2 mg/dL   GFR calc non Af Amer >90  >90 mL/min   GFR calc Af Amer >90  >90 mL/min   Comment: (NOTE)     The eGFR has been  calculated using the CKD EPI equation.     This calculation has not been validated in all clinical situations.     eGFR's persistently <90  mL/min signify possible Chronic Kidney     Disease.  TROPONIN I     Status: None   Collection Time    11/24/13 10:40 AM      Result Value Range   Troponin I <0.30  <0.30 ng/mL   Comment:            Due to the release kinetics of cTnI,     a negative result within the first hours     of the onset of symptoms does not rule out     myocardial infarction with certainty.     If myocardial infarction is still suspected,     repeat the test at appropriate intervals.  POCT I-STAT TROPONIN I     Status: None   Collection Time    11/24/13 10:44 AM      Result Value Range   Troponin i, poc 0.00  0.00 - 0.08 ng/mL   Comment 3            Comment: Due to the release kinetics of cTnI,     a negative result within the first hours     of the onset of symptoms does not rule out     myocardial infarction with certainty.     If myocardial infarction is still suspected,     repeat the test at appropriate intervals.  POCT I-STAT, CHEM 8     Status: Abnormal   Collection Time    11/24/13 10:46 AM      Result Value Range   Sodium 136 (*) 137 - 147 mEq/L   Potassium 3.9  3.7 - 5.3 mEq/L   Chloride 100  96 - 112 mEq/L   BUN 14  6 - 23 mg/dL   Creatinine, Ser 0.80  0.50 - 1.10 mg/dL   Glucose, Bld 210 (*) 70 - 99 mg/dL   Calcium, Ion 1.14  1.12 - 1.23 mmol/L   TCO2 19  0 - 100 mmol/L   Hemoglobin 17.0 (*) 12.0 - 15.0 g/dL   HCT 50.0 (*) 36.0 - 46.0 %  URINE RAPID DRUG SCREEN (HOSP PERFORMED)     Status: Abnormal   Collection Time    11/24/13 11:02 AM      Result Value Range   Opiates NONE DETECTED  NONE DETECTED   Cocaine NONE DETECTED  NONE DETECTED   Benzodiazepines POSITIVE (*) NONE DETECTED   Amphetamines NONE DETECTED  NONE DETECTED   Tetrahydrocannabinol NONE DETECTED  NONE DETECTED   Barbiturates NONE DETECTED  NONE DETECTED   Comment:             DRUG SCREEN FOR MEDICAL PURPOSES     ONLY.  IF CONFIRMATION IS NEEDED     FOR ANY PURPOSE, NOTIFY LAB     WITHIN 5 DAYS.                LOWEST DETECTABLE LIMITS     FOR URINE DRUG SCREEN     Drug Class       Cutoff (ng/mL)     Amphetamine      1000     Barbiturate      200     Benzodiazepine   235     Tricyclics       361     Opiates          300     Cocaine          300     THC  50  GLUCOSE, CAPILLARY     Status: Abnormal   Collection Time    11/24/13 11:17 AM      Result Value Range   Glucose-Capillary 173 (*) 70 - 99 mg/dL  URINALYSIS, ROUTINE W REFLEX MICROSCOPIC     Status: Abnormal   Collection Time    11/24/13 11:45 AM      Result Value Range   Color, Urine AMBER (*) YELLOW   Comment: BIOCHEMICALS MAY BE AFFECTED BY COLOR   APPearance CLOUDY (*) CLEAR   Specific Gravity, Urine 1.028  1.005 - 1.030   pH 5.0  5.0 - 8.0   Glucose, UA NEGATIVE  NEGATIVE mg/dL   Hgb urine dipstick NEGATIVE  NEGATIVE   Bilirubin Urine SMALL (*) NEGATIVE   Ketones, ur 40 (*) NEGATIVE mg/dL   Protein, ur 100 (*) NEGATIVE mg/dL   Urobilinogen, UA 1.0  0.0 - 1.0 mg/dL   Nitrite NEGATIVE  NEGATIVE   Leukocytes, UA NEGATIVE  NEGATIVE  URINE MICROSCOPIC-ADD ON     Status: Abnormal   Collection Time    11/24/13 11:45 AM      Result Value Range   Squamous Epithelial / LPF RARE  RARE   WBC, UA 0-2  <3 WBC/hpf   RBC / HPF 0-2  <3 RBC/hpf   Bacteria, UA FEW (*) RARE   Casts GRANULAR CAST (*) NEGATIVE   Urine-Other AMORPHOUS URATES/PHOSPHATES     Comment: MUCOUS PRESENT    Radiological Exams on Admission: Ct Head Wo Contrast  11/24/2013   CLINICAL DATA:  Code stroke.  Word Salad.  EXAM: CT HEAD WITHOUT CONTRAST  TECHNIQUE: Contiguous axial images were obtained from the base of the skull through the vertex without intravenous contrast.  COMPARISON:  None.  FINDINGS: No acute cortical infarct, hemorrhage, or mass lesion is present. The ventricles are of normal size. No significant  extra-axial fluid collection is evident. The paranasal sinuses and mastoid air cells are clear. The osseous skull is intact.  IMPRESSION: Negative CT of the head.  These results were called by telephone at the time of interpretation on 11/24/2013 at 10:38 AM to Dr. Doy Mince, who verbally acknowledged these results.   Electronically Signed   By: Lawrence Santiago M.D.   On: 11/24/2013 10:39   Mr Angiogram Neck W Wo Contrast  11/24/2013   CLINICAL DATA:  59-YEAR-OLD female with seizure and fall at work. Altered mental status. Headache and photophobia.  EXAM: MRI HEAD WITHOUT AND WITH CONTRAST  MRA NECK WITHOUT AND WITH CONTRAST  TECHNIQUE: Multiplanar, multiecho pulse sequences of the brain and surrounding structures were obtained without and with intravenous contrast. Angiographic images of the neck were obtained using MRA technique without and with contrast.  CONTRAST:  38m MULTIHANCE GADOBENATE DIMEGLUMINE 529 MG/ML IV SOLN  COMPARISON:  Head CT without contrast 11/24/2013.  FINDINGS: MRI HEAD FINDINGS  Cerebral volume is normal. No restricted diffusion to suggest acute infarction. No midline shift, mass effect, evidence of mass lesion, ventriculomegaly, extra-axial collection or acute intracranial hemorrhage. Cervicomedullary junction and pituitary are within normal limits. Negative visualized cervical spine. Major intracranial vascular flow voids are preserved.  GPearline Cablesand white matter signal is within normal limits throughout the brain. Motion artifact on thin slice coronal images, but mesial temporal lobe structures appear symmetric and within normal limits. No abnormal enhancement identified.  Visualized orbit soft tissues are within normal limits. Visualized paranasal sinuses are clear. Trace left mastoid fluid. Negative visualized nasopharynx. Visualized scalp soft tissues are within normal  limits.  MRA NECK FINDINGS  Left thyroid T2 hyperintense nodule measuring up to 13 mm. No suspicious features identified.  In patients older than 35 years this does not require further evaluation. This follows ACR consensus guidelines: Managing Incidental Thyroid Nodules Detected on Imaging: White Paper of the ACR Incidental Thyroid Findings Committee. J AM Coll Radiol, Published On-line: September 10, 2013.  Precontrast time-of-flight images demonstrate antegrade flow in both cervical carotid and vertebral arteries.  Post contrast time-of-flight images. Three vessel arch configuration with normal great vessel origins.  Normal right CCA. Normal right carotid bifurcation. Normal cervical right ICA. Visible right anterior intracranial circulation appears grossly normal; fetal type right PCA origin suspected.  Normal left CCA except for mild proximal tortuosity. Normal left carotid bifurcation. Normal cervical left ICA. Visible left anterior intracranial circulation appears grossly normal.  Both vertebral artery origins are within normal limits. The cervical vertebral arteries are codominant and within normal limits. Visible posterior intracranial circulation remarkable for fenestration of the right V4 segment (normal anatomic variation) and otherwise within normal limits.  IMPRESSION: 1.  Normal MRI appearance of the brain. 2. Negative neck MRA.   Electronically Signed   By: Lars Pinks M.D.   On: 11/24/2013 16:28   Mr Jeri Cos QI Contrast  11/24/2013   CLINICAL DATA:  54-YEAR-OLD female with seizure and fall at work. Altered mental status. Headache and photophobia.  EXAM: MRI HEAD WITHOUT AND WITH CONTRAST  MRA NECK WITHOUT AND WITH CONTRAST  TECHNIQUE: Multiplanar, multiecho pulse sequences of the brain and surrounding structures were obtained without and with intravenous contrast. Angiographic images of the neck were obtained using MRA technique without and with contrast.  CONTRAST:  60mL MULTIHANCE GADOBENATE DIMEGLUMINE 529 MG/ML IV SOLN  COMPARISON:  Head CT without contrast 11/24/2013.  FINDINGS: MRI HEAD FINDINGS  Cerebral volume  is normal. No restricted diffusion to suggest acute infarction. No midline shift, mass effect, evidence of mass lesion, ventriculomegaly, extra-axial collection or acute intracranial hemorrhage. Cervicomedullary junction and pituitary are within normal limits. Negative visualized cervical spine. Major intracranial vascular flow voids are preserved.  Pearline Cables and white matter signal is within normal limits throughout the brain. Motion artifact on thin slice coronal images, but mesial temporal lobe structures appear symmetric and within normal limits. No abnormal enhancement identified.  Visualized orbit soft tissues are within normal limits. Visualized paranasal sinuses are clear. Trace left mastoid fluid. Negative visualized nasopharynx. Visualized scalp soft tissues are within normal limits.  MRA NECK FINDINGS  Left thyroid T2 hyperintense nodule measuring up to 13 mm. No suspicious features identified. In patients older than 35 years this does not require further evaluation. This follows ACR consensus guidelines: Managing Incidental Thyroid Nodules Detected on Imaging: White Paper of the ACR Incidental Thyroid Findings Committee. J AM Coll Radiol, Published On-line: September 10, 2013.  Precontrast time-of-flight images demonstrate antegrade flow in both cervical carotid and vertebral arteries.  Post contrast time-of-flight images. Three vessel arch configuration with normal great vessel origins.  Normal right CCA. Normal right carotid bifurcation. Normal cervical right ICA. Visible right anterior intracranial circulation appears grossly normal; fetal type right PCA origin suspected.  Normal left CCA except for mild proximal tortuosity. Normal left carotid bifurcation. Normal cervical left ICA. Visible left anterior intracranial circulation appears grossly normal.  Both vertebral artery origins are within normal limits. The cervical vertebral arteries are codominant and within normal limits. Visible posterior  intracranial circulation remarkable for fenestration of the right V4 segment (normal anatomic variation) and otherwise  within normal limits.  IMPRESSION: 1.  Normal MRI appearance of the brain. 2. Negative neck MRA.   Electronically Signed   By: Lars Pinks M.D.   On: 11/24/2013 16:28    Assessment/Plan Active Problems:   Seizure   Collapse   Syncopal event -Per coworker report, this seems to be a generalized tonic-clonic seizure with an appropriate postictal period. -She has already been assessed by Dr. Doy Mince with neurology, who recommended an MRI and EEG. -MRI and EEG have both been done and interpreted as within normal limits. -Discussed with Dr. Doy Mince who agrees that patient does not need to be admitted at this time as her workup is complete. -Antiseizure medications are certainly not indicated at this point either. 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 Patient has been instructed to not abruptly cease use of her Xanax. -She will need to be seen next week by her primary care physician to clear her for driving.  Anxiety disorder -Continue Xanax as prescribed by primary care physician. -Continue outpatient followup as scheduled.   Time Spent on Consultation: 75 minutes  [facility]  [phone] 11/24/2013, 4:44 PM

## 2013-11-24 NOTE — ED Notes (Signed)
Per EMS - pt found unresponsive on the floor by ems, LSN 0830. Pupils dilated. Very confused, word salad. Possible seizure hx when she was a child. No facial droop, no weakness. Pt c/o HA. BP 140/100, 138/98, HR 120 CBG 292. During transport pt would have moments of clarity but then go back to having word salad or talking about a cash registry.

## 2013-11-24 NOTE — Code Documentation (Signed)
48yo female arriving to [facility] via [place].  EMS reports that she was seen at her baseline at 0830 by her coworkers and then later found on the floor confused.  Patient taken to CT on arrival.  NIHSS 0 on arrival.  See code stroke log for times and documentation. Coworker reports that she was in her cubicle and heard a noise, went to the patient and found her jerking which lasted 2-3 minutes.  Coworker produced the patient's Xanax prescription bottle which was empty.  No acute stroke treatment at this time per Dr. Doy Mince.  Code stroke canceled at 1046.  Bedside handoff with ED RN  Lanelle Bal.

## 2013-11-24 NOTE — ED Provider Notes (Signed)
Agree with documentation.    Elmer Sow, MD 11/24/13 [phone]

## 2013-11-24 NOTE — ED Notes (Signed)
Main lab called, urine split out, need to recollect.

## 2013-11-24 NOTE — ED Notes (Signed)
Pt denies frequent headaches.

## 2013-11-24 NOTE — ED Notes (Signed)
Pt transported to EEG 

## 2013-11-24 NOTE — ED Provider Notes (Addendum)
CSN: [id number]     Arrival date & time 11/24/13  1022 History   First MD Initiated Contact with Patient 11/24/13 1028     Chief Complaint  Patient presents with  . Code Stroke   (Consider location/radiation/quality/duration/timing/severity/associated sxs/prior Treatment) HPI Comments: 48 yo wf with pmh of increased chol, anxiety, Vit D deficiency presents with collapse.  Pt was found down at work by [employer].  Pt works in [employer].    Pt does not recall any of the incident.  EMS was called and the pt was t/s to [facility] ED as a code stroke.  She was noted to have confusion and word salad en route.    10:57 AM Pt denies symptoms currently.    Pt was evaluated by Neuro on arrival.  Please see Dr. Doy Mince documentation.    Patient is a 48 y.o. female presenting with Acute Neurological Problem. The history is provided by the patient and the EMS personnel.  Cerebrovascular Accident This is a new problem. The current episode started 1 to 2 hours ago. The problem occurs constantly. The problem has been gradually improving. Associated symptoms include headaches. Pertinent negatives include no chest pain, no abdominal pain and no shortness of breath. Associated symptoms comments: Mild HA. Nothing aggravates the symptoms. Nothing relieves the symptoms. She has tried nothing (BG 292) for the symptoms.    Past Medical History  Diagnosis Date  . Hypercholesterolemia   . Overweight   . Vitamin D deficiency   . Anxiety    Past Surgical History  Procedure Laterality Date  . Back surgery     Family History  Problem Relation Age of Onset  . Hypertension Mother   . Migraines Mother   . Hypertension Father    History  Substance Use Topics  . Smoking status: Former Smoker -- 3 years    Quit date: 11/10/1988  . Smokeless tobacco: Not on file  . Alcohol Use: Yes     Comment: social   OB History   Grav Para Term Preterm Abortions TAB SAB Ect Mult Living                 Review of Systems   Constitutional: Negative.   Eyes: Negative.   Respiratory: Negative.  Negative for shortness of breath.   Cardiovascular: Negative for chest pain.  Gastrointestinal: Negative for abdominal pain.  Endocrine: Negative.   Genitourinary: Negative.   Neurological: Positive for headaches. Negative for tremors, seizures, syncope, speech difficulty, light-headedness and numbness.       Confusion.    Allergies  Review of patient's allergies indicates no known allergies.  Home Medications   Current Outpatient Rx  Name  Route  Sig  Dispense  Refill  . ALPRAZolam (XANAX) 0.5 MG tablet   Oral   Take 0.25-0.5 mg by mouth 2 (two) times daily as needed for anxiety.         . calcium carbonate (OS-CAL) 600 MG TABS   Oral   Take 600 mg by mouth daily.           . cholecalciferol (VITAMIN D) 1000 UNITS tablet   Oral   Take 1,000 Units by mouth daily.           . Multiple Vitamins-Minerals (MULTIVITAMIN & MINERAL PO)   Oral   Take 1 tablet by mouth daily.            BP 134/92  Pulse 96  Resp 22  SpO2 100%  LMP 11/07/2013 Physical Exam  Vitals reviewed. Constitutional: She is oriented to person, place, and time. She appears well-developed and well-nourished.  HENT:  Head: Normocephalic and atraumatic.  Eyes: Conjunctivae are normal. Right eye exhibits no discharge. Left eye exhibits no discharge.  Neck: Neck supple.  Cardiovascular: Normal rate and regular rhythm.   Pulmonary/Chest: Effort normal and breath sounds normal. No respiratory distress. She has no wheezes. She has no rales.  Abdominal: Soft. Bowel sounds are normal. She exhibits no distension and no mass. There is no tenderness. There is no rebound and no guarding.  Musculoskeletal: Normal range of motion. She exhibits no edema and no tenderness.  MAE, nttp  Neurological: She is alert and oriented to person, place, and time. She has normal reflexes. She displays normal reflexes. She exhibits normal muscle tone.   See neurology note for full neuro exam  Skin: Skin is warm and dry.    ED Course  Procedures (including critical care time) Labs Review Labs Reviewed  CBC - Abnormal; Notable for the following:    Hemoglobin 15.7 (*)    All other components within normal limits  COMPREHENSIVE METABOLIC PANEL - Abnormal; Notable for the following:    Sodium 135 (*)    Chloride 91 (*)    CO2 17 (*)    Glucose, Bld 209 (*)    Total Protein 8.9 (*)    All other components within normal limits  URINE RAPID DRUG SCREEN (HOSP PERFORMED) - Abnormal; Notable for the following:    Benzodiazepines POSITIVE (*)    All other components within normal limits  GLUCOSE, CAPILLARY - Abnormal; Notable for the following:    Glucose-Capillary 173 (*)    All other components within normal limits  URINALYSIS, ROUTINE W REFLEX MICROSCOPIC - Abnormal; Notable for the following:    Color, Urine AMBER (*)    APPearance CLOUDY (*)    Bilirubin Urine SMALL (*)    Ketones, ur 40 (*)    Protein, ur 100 (*)    All other components within normal limits  URINE MICROSCOPIC-ADD ON - Abnormal; Notable for the following:    Bacteria, UA FEW (*)    Casts GRANULAR CAST (*)    All other components within normal limits  POCT I-STAT, CHEM 8 - Abnormal; Notable for the following:    Sodium 136 (*)    Glucose, Bld 210 (*)    Hemoglobin 17.0 (*)    HCT 50.0 (*)    All other components within normal limits  ETHANOL  PROTIME-INR  APTT  DIFFERENTIAL  TROPONIN I  POCT I-STAT TROPONIN I   UHCG - not ordered; pt denies pregnancy  Imaging Review Ct Head Wo Contrast  11/24/2013   CLINICAL DATA:  Code stroke.  Word Salad.  EXAM: CT HEAD WITHOUT CONTRAST  TECHNIQUE: Contiguous axial images were obtained from the base of the skull through the vertex without intravenous contrast.  COMPARISON:  None.  FINDINGS: No acute cortical infarct, hemorrhage, or mass lesion is present. The ventricles are of normal size. No significant extra-axial  fluid collection is evident. The paranasal sinuses and mastoid air cells are clear. The osseous skull is intact.  IMPRESSION: Negative CT of the head.  These results were called by telephone at the time of interpretation on 11/24/2013 at 10:38 AM to Dr. Doy Mince, who verbally acknowledged these results.   Electronically Signed   By: Lawrence Santiago M.D.   On: 11/24/2013 10:39    EKG Interpretation    Date/Time:  Thursday November 24 2013  11:07:12 EST Ventricular Rate:  98 PR Interval:  150 QRS Duration: 88 QT Interval:  338 QTC Calculation: 431 R Axis:   96 Text Interpretation:  Sinus rhythm Borderline right axis deviation Low voltage, precordial leads Borderline T abnormalities, diffuse leads Confirmed by [facility]  MD, Icey Tello ([id number]) on 11/24/2013 12:30:58 PM Also confirmed by [facility]  MD, Joscelyn Hardrick [phone])  on 11/24/2013 1:01:38 PM           Results for orders placed during the hospital encounter of 11/24/13  ETHANOL      Result Value Range   Alcohol, Ethyl (B) <11  0 - 11 mg/dL  PROTIME-INR      Result Value Range   Prothrombin Time 13.5  11.6 - 15.2 seconds   INR 1.05  0.00 - 1.49  APTT      Result Value Range   aPTT 24  24 - 37 seconds  CBC      Result Value Range   WBC 9.2  4.0 - 10.5 K/uL   RBC 5.00  3.87 - 5.11 MIL/uL   Hemoglobin 15.7 (*) 12.0 - 15.0 g/dL   HCT 44.5  36.0 - 46.0 %   MCV 89.0  78.0 - 100.0 fL   MCH 31.4  26.0 - 34.0 pg   MCHC 35.3  30.0 - 36.0 g/dL   RDW 12.6  11.5 - 15.5 %   Platelets 250  150 - 400 K/uL  DIFFERENTIAL      Result Value Range   Neutrophils Relative % 69  43 - 77 %   Neutro Abs 6.3  1.7 - 7.7 K/uL   Lymphocytes Relative 23  12 - 46 %   Lymphs Abs 2.1  0.7 - 4.0 K/uL   Monocytes Relative 8  3 - 12 %   Monocytes Absolute 0.8  0.1 - 1.0 K/uL   Eosinophils Relative 0  0 - 5 %   Eosinophils Absolute 0.0  0.0 - 0.7 K/uL   Basophils Relative 0  0 - 1 %   Basophils Absolute 0.0  0.0 - 0.1 K/uL  COMPREHENSIVE METABOLIC PANEL      Result Value  Range   Sodium 135 (*) 137 - 147 mEq/L   Potassium 4.0  3.7 - 5.3 mEq/L   Chloride 91 (*) 96 - 112 mEq/L   CO2 17 (*) 19 - 32 mEq/L   Glucose, Bld 209 (*) 70 - 99 mg/dL   BUN 14  6 - 23 mg/dL   Creatinine, Ser 0.67  0.50 - 1.10 mg/dL   Calcium 9.7  8.4 - 10.5 mg/dL   Total Protein 8.9 (*) 6.0 - 8.3 g/dL   Albumin 4.7  3.5 - 5.2 g/dL   AST 16  0 - 37 U/L   ALT 18  0 - 35 U/L   Alkaline Phosphatase 102  39 - 117 U/L   Total Bilirubin 0.6  0.3 - 1.2 mg/dL   GFR calc non Af Amer >90  >90 mL/min   GFR calc Af Amer >90  >90 mL/min  TROPONIN I      Result Value Range   Troponin I <0.30  <0.30 ng/mL  URINE RAPID DRUG SCREEN (HOSP PERFORMED)      Result Value Range   Opiates NONE DETECTED  NONE DETECTED   Cocaine NONE DETECTED  NONE DETECTED   Benzodiazepines POSITIVE (*) NONE DETECTED   Amphetamines NONE DETECTED  NONE DETECTED   Tetrahydrocannabinol NONE DETECTED  NONE DETECTED   Barbiturates NONE DETECTED  NONE DETECTED  GLUCOSE, CAPILLARY      Result Value Range   Glucose-Capillary 173 (*) 70 - 99 mg/dL  URINALYSIS, ROUTINE W REFLEX MICROSCOPIC      Result Value Range   Color, Urine AMBER (*) YELLOW   APPearance CLOUDY (*) CLEAR   Specific Gravity, Urine 1.028  1.005 - 1.030   pH 5.0  5.0 - 8.0   Glucose, UA NEGATIVE  NEGATIVE mg/dL   Hgb urine dipstick NEGATIVE  NEGATIVE   Bilirubin Urine SMALL (*) NEGATIVE   Ketones, ur 40 (*) NEGATIVE mg/dL   Protein, ur 100 (*) NEGATIVE mg/dL   Urobilinogen, UA 1.0  0.0 - 1.0 mg/dL   Nitrite NEGATIVE  NEGATIVE   Leukocytes, UA NEGATIVE  NEGATIVE  URINE MICROSCOPIC-ADD ON      Result Value Range   Squamous Epithelial / LPF RARE  RARE   WBC, UA 0-2  <3 WBC/hpf   RBC / HPF 0-2  <3 RBC/hpf   Bacteria, UA FEW (*) RARE   Casts GRANULAR CAST (*) NEGATIVE   Urine-Other AMORPHOUS URATES/PHOSPHATES    POCT I-STAT, CHEM 8      Result Value Range   Sodium 136 (*) 137 - 147 mEq/L   Potassium 3.9  3.7 - 5.3 mEq/L   Chloride 100  96 - 112  mEq/L   BUN 14  6 - 23 mg/dL   Creatinine, Ser 0.80  0.50 - 1.10 mg/dL   Glucose, Bld 210 (*) 70 - 99 mg/dL   Calcium, Ion 1.14  1.12 - 1.23 mmol/L   TCO2 19  0 - 100 mmol/L   Hemoglobin 17.0 (*) 12.0 - 15.0 g/dL   HCT 50.0 (*) 36.0 - 46.0 %  POCT I-STAT TROPONIN I      Result Value Range   Troponin i, poc 0.00  0.00 - 0.08 ng/mL   Comment 3              MDM   1. Seizure   2.  LOC 3.  Collapse   48 year old white female presents emergency department after collapsing at work. Initially code stroke was called however neurology evaluated and no acute neurologic deficit was noted, but a stroke workup was initiated. Patient will need further evaluation for potential syncope/seizures/stroke.  On my exam patient was symptom free other than a mild headache. Her workup thus far has been unremarkable with the exception of mildly elevated blood glucose.  CT head was negative.  Case discussed with Dr. Doy Mince neurology. Plan for admission to hospitalist. Spoke with Dr. Jerilee Hoh who will admit patient.    4:48 PM Patient had a prolonged stay in the emergency department awaiting EEG, MRI, and continued observation.  MRI, MRA, and EEG unremarkable. Dr. Jerilee Hoh evaluated the patient and discussed the case at length with Dr. Doy Mince neurology. Joint decision with neurology, internal medicine, and myself to discharge patient home.  No evidence of emergent etiology at this time. Prolonged ER stay revealed no episodes. Patient is comfortable being discharge found. Driving restrictions were given and she should not drive until she is cleared by her primary care provider Dr. Lenna Gilford.  Strict ER return precautions were given and the patient agrees with the plan.  Pt is aware that [facility] was not ordered.  She adamantly denies any possibility of pregnancy.  LMP 3 wks ago.    At time of discharge patient has completely cleared normal sensorium. She is appropriate. She has no neurologic deficit. Normal speech  normal stance normal  gait.  Doubt stroke, TIA, grand mal seizure, ACS, cardiac syncope, intoxication, or other emergent issue.    Elmer Sow, MD 11/24/13 1657  Elmer Sow, MD 11/24/13 [phone]

## 2013-11-24 NOTE — Discharge Instructions (Signed)
Syncope Syncope means a person passes out (faints). The person usually wakes up in less than 5 minutes. It is important to seek medical care for syncope. HOME CARE  Have someone stay with you until you feel normal.  Do not drive, use machines, or play sports until your doctor says it is okay.  Keep all doctor visits as told.  Lie down when you feel like you might pass out. Take deep breaths. Wait until you feel normal before standing up.  Drink enough fluids to keep your pee (urine) clear or pale yellow.  If you take blood pressure or heart medicine, get up slowly. Take several minutes to sit and then stand. GET HELP RIGHT AWAY IF:   You have a severe headache.  You have pain in the chest, belly (abdomen), or back.  You are bleeding from the mouth or butt (rectum).  You have black or tarry poop (stool).  You have an irregular or very fast heartbeat.  You have pain with breathing.  You keep passing out, or you have shaking (seizures) when you pass out.  You pass out when sitting or lying down.  You feel confused.  You have trouble walking.  You have severe weakness.  You have vision problems. If you fainted, call your local emergency services (911 in [place] Do not drive yourself to the hospital. MAKE SURE YOU:   Understand these instructions.  Will watch your condition.  Will get help right away if you are not doing well or get worse. Document Released: 04/14/2008 Document Revised: 04/27/2012 Document Reviewed: 12/26/2011 [facility] Patient Information 2014 [place], [place].  Seizure, Adult A seizure means there is unusual activity in the brain. A seizure can cause changes in attention or behavior. Seizures often cause shaking (convulsions). Seizures often last from 30 seconds to 2 minutes. HOME CARE   If you are given medicines, take them exactly as told by your doctor.  Keep all doctor visits as told.  Do not swim or drive until your doctor says it is  okay.  Teach others what to do if you have a seizure. They should:  Lay you on the ground.  Put a cushion under your head.  Loosen any tight clothing around your neck.  Turn you on your side.  Stay with you until you get better. GET HELP RIGHT AWAY IF:   The seizure lasts longer than 2 to 5 minutes.  The seizure is very bad.  The person does not wake up after the seizure.  The person's attention or behavior changes. Drive the person to the emergency room or call your local emergency services (911 in [place] MAKE SURE YOU:   Understand these instructions.  Will watch your condition.  Will get help right away if you are not doing well or get worse. Document Released: 04/14/2008 Document Revised: 01/19/2012 Document Reviewed: 10/15/2011 [facility] Patient Information 2014 [place].  DO NOT DRIVE, SWIM, OR WORK FROM HEIGHTS UNTIL CLEARED BY YOUR PRIMARY CARE PROVIDER.

## 2013-11-24 NOTE — Consult Note (Addendum)
Referring Physician: Masneri    Chief Complaint: AMS/Code stroke  HPI:                                                                                                                                         Misty Howell is an 48 y.o. female who was at work when a coworker heard her fall to the floor in the cubical next to him.  He found her shaking and went to get his boss.  By the time her boss arrived (2-3 minutes) she was found still, with snoring snoring, not responding to stimuli. About 1 minute later she quickly came to and started to speak incomprehensible speech. EMS was called.  In route patient was noted to be perseverating on "a registration" and then have periods of clearity. On arrival patient was able to state her name, age and location but when asked complex questions had a hard time giving full detailed answer. Her main complaint at this time is a HA which is throbbing and rated at a 6/10 and photophobia.  When asked further questions about if she recalls what happened this AM or last night she has difficulty recalling events and giving a good description of events.  Of note:  Patietn takes Xanax and has a Rx for .5-1 mg to be taken 1-2 times a day.  Last refill was 11/06/13 for 60 tabs.  There was a empty bottle in her purse. When directly asked about if she takes Xanax daily or how much she recently took she is not specific and gives no clear answer.  She states she separates her medications in two separate bottles.   Date last known well: Date: 11/24/2012 Time last known well: Time: 08:30 tPA Given: No: NIHSS 0  Past Medical History  Diagnosis Date  . Hypercholesterolemia   . Overweight   . Vitamin D deficiency   . Anxiety     Past Surgical History  Procedure Laterality Date  . Back surgery      Family History  Problem Relation Age of Onset  . Hypertension Mother   . Migraines Mother   . Hypertension Father    Social History:  reports that she quit smoking about  25 years ago. She does not have any smokeless tobacco history on file. She reports that she drinks alcohol. She reports that she does not use illicit drugs.  Allergies: No Known Allergies  Medications:  No current facility-administered medications for this encounter.   Current Outpatient Prescriptions  Medication Sig Dispense Refill  . ALPRAZolam (XANAX) 0.5 MG tablet TAKE 1/2-1 TABLET BY MOUTH 2 TIMES A DAY AS NEEDED  90 tablet  1  . ALPRAZolam (XANAX) 0.5 MG tablet TAKE 1/2-1 TALBET BY MOUTH 2 TIMES A DAY AS NEEDED  90 tablet  5  . calcium carbonate (OS-CAL) 600 MG TABS Take 600 mg by mouth daily.        . cholecalciferol (VITAMIN D) 1000 UNITS tablet Take 1,000 Units by mouth daily.        . Multiple Vitamins-Minerals (MULTIVITAMIN & MINERAL PO) Take 1 tablet by mouth daily.           ROS:                                                                                                                                       History obtained from the patient  General ROS: negative for - chills, fatigue, fever, night sweats, weight gain or weight loss Psychological ROS: negative for - behavioral disorder, hallucinations, memory difficulties, mood swings or suicidal ideation Ophthalmic ROS: negative for - blurry vision, double vision, eye pain or loss of vision ENT ROS: negative for - epistaxis, nasal discharge, oral lesions, sore throat, tinnitus or vertigo Allergy and Immunology ROS: negative for - hives or itchy/watery eyes Hematological and Lymphatic ROS: negative for - bleeding problems, bruising or swollen lymph nodes Endocrine ROS: negative for - galactorrhea, hair pattern changes, polydipsia/polyuria or temperature intolerance Respiratory ROS: negative for - cough, hemoptysis, shortness of breath or wheezing Cardiovascular ROS: negative for - chest pain, dyspnea  on exertion, edema or irregular heartbeat Gastrointestinal ROS: negative for - abdominal pain, diarrhea, hematemesis, nausea/vomiting or stool incontinence Genito-Urinary ROS: negative for - dysuria, hematuria, incontinence or urinary frequency/urgency Musculoskeletal ROS: negative for - joint swelling or muscular weakness Neurological ROS: as noted in HPI Dermatological ROS: negative for rash and skin lesion changes  Neurologic Examination:                                                                                                      Blood pressure 136/83, pulse 104, last menstrual period 11/07/2013, SpO2 99.00%.  Mental Status: Alert, oriented to place, age and year but when asked further questions about if she recalls what happened this AM or last night she has difficulty recalling events and giving a good description of events. She will follow commands without  any significant difficulty but at times has to be redirected and told twice.   Speech fluent without evidence of aphasia.   Cranial Nerves: II: Discs flat bilaterally; Visual fields grossly normal, pupils equal, round, reactive to light and accommodation III,IV, VI: ptosis not present, extra-ocular motions intact bilaterally V,VII: smile symmetric, facial light touch sensation normal bilaterally VIII: hearing normal bilaterally IX,X: gag reflex present XI: bilateral shoulder shrug XII: midline tongue extension without atrophy or fasciculations  Motor: Right : Upper extremity   5/5    Left:     Upper extremity   5/5  Lower extremity   5/5     Lower extremity   5/5 Tone and bulk:normal tone throughout; no atrophy noted Sensory: Pinprick and light touch intact throughout, bilaterally Deep Tendon Reflexes:  Right: Upper Extremity   Left: Upper extremity   biceps (C-5 to C-6) 2/4   biceps (C-5 to C-6) 2/4 tricep (C7) 2/4    triceps (C7) 2/4 Brachioradialis (C6) 2/4  Brachioradialis (C6) 2/4  Lower Extremity Lower Extremity   quadriceps (L-2 to L-4) 2/4   quadriceps (L-2 to L-4) 2/4 Achilles (S1) 2/4   Achilles (S1) 2/4  Plantars: Right: downgoing   Left: downgoing Cerebellar: normal finger-to-nose,  normal heel-to-shin test Gait: not tested CV: pulses palpable throughout    Lab Results: Basic Metabolic Panel:  Recent Labs Lab 11/24/13 1046  NA 136*  K 3.9  CL 100  GLUCOSE 210*  BUN 14  CREATININE 0.80    Liver Function Tests: No results found for this basename: AST, ALT, ALKPHOS, BILITOT, PROT, ALBUMIN,  in the last 168 hours No results found for this basename: LIPASE, AMYLASE,  in the last 168 hours No results found for this basename: AMMONIA,  in the last 168 hours  CBC:  Recent Labs Lab 11/24/13 1040 11/24/13 1046  WBC 9.2  --   NEUTROABS 6.3  --   HGB 15.7* 17.0*  HCT 44.5 50.0*  MCV 89.0  --   PLT 250  --     Cardiac Enzymes: No results found for this basename: CKTOTAL, CKMB, CKMBINDEX, TROPONINI,  in the last 168 hours  Lipid Panel: No results found for this basename: CHOL, TRIG, HDL, CHOLHDL, VLDL, LDLCALC,  in the last 168 hours  CBG:  Recent Labs Lab 11/24/13 [place]*    Microbiology: No results found for this or any previous visit.  Coagulation Studies:  Recent Labs  11/24/13 1040  LABPROT 13.5  INR 1.05    Imaging: Ct Head Wo Contrast  11/24/2013   CLINICAL DATA:  Code stroke.  Word Salad.  EXAM: CT HEAD WITHOUT CONTRAST  TECHNIQUE: Contiguous axial images were obtained from the base of the skull through the vertex without intravenous contrast.  COMPARISON:  None.  FINDINGS: No acute cortical infarct, hemorrhage, or mass lesion is present. The ventricles are of normal size. No significant extra-axial fluid collection is evident. The paranasal sinuses and mastoid air cells are clear. The osseous skull is intact.  IMPRESSION: Negative CT of the head.  These results were called by telephone at the time of interpretation on 11/24/2013 at 10:38 AM to  Dr. Doy Mince, who verbally acknowledged these results.   Electronically Signed   By: Lawrence Santiago M.D.   On: 11/24/2013 10:39    Etta Quill PA-C [facility] [phone]  11/24/2013, 11:23 AM   Patient seen and examined.  Clinical course and management discussed.  Necessary edits performed.  I agree with the  above.  Assessment and plan of care developed and discussed below.   Assessment: 48 y.o. female presenting after being found on the floor shaking.  Patient slowly returning to baseline.  Per description of co-workers actually seems to have likely had a seizure and not a stroke.  The patient takes Xanax and her current bottle that was filled at the end of the month is empty.  She is vague about how she takes her Xanax.  Although this may be a withdrawal seizure would like to rule out the possibility of other etiologies.    Stroke Risk Factors - hyperlipidemia   Plan: 1. Would not initiate stroke work up at this time.  Patient seems to have had a seizure. 2. EEG 3. MRI of the brain with and without contrast 4. Initiation of antiepileptic therapy not indicated at this time.  This is the first event and seizure may have been provoked.   5. Seizure precautions 6. Patient unable to drive, operate heavy machinery, perform activities at heights and participate in water activities until release by outpatient physician.  Case discussed with Dr. Lady Gary, MD [facility] [phone]  11/24/2013  12:46 PM

## 2013-11-24 NOTE — ED Notes (Signed)
Dr. Doy Mince canceled code stroke.

## 2013-11-24 NOTE — ED Notes (Signed)
Pt reports she doesn't remember anything from this morning, thinks she remembers going to work this morning but sts that she thinks that was yesterday. sts she does remember going to work yesterday.

## 2013-11-24 NOTE — Progress Notes (Signed)
EEG Completed; Results Pending  

## 2013-11-24 NOTE — ED Notes (Signed)
[id number]. Notified RN

## 2013-11-24 NOTE — ED Notes (Signed)
Co-workers here, report that they found an empty bottle of Xanax, filled on 11/02/13, 60 day supply. Pt reports she takes them everyday, then says well "I haven't taken them in 3-4 days" later on she reports "i may have taken one yesterday but can't remember. "

## 2013-12-28 ENCOUNTER — Ambulatory Visit: Payer: Self-pay | Admitting: Pulmonary Disease

## 2014-02-17 ENCOUNTER — Telehealth: Payer: Self-pay | Admitting: Pulmonary Disease

## 2014-02-17 NOTE — Telephone Encounter (Signed)
Spoke with pt. Aware sn has retired from [facility] [employer] 02/08/14. She has been giving # to [place] location

## 2014-04-07 ENCOUNTER — Ambulatory Visit (INDEPENDENT_AMBULATORY_CARE_PROVIDER_SITE_OTHER): Payer: No Typology Code available for payment source | Admitting: Family

## 2014-04-07 ENCOUNTER — Encounter: Payer: Self-pay | Admitting: Family

## 2014-04-07 VITALS — BP 102/80 | HR 82 | Temp 98.5°F | Wt 194.0 lb

## 2014-04-07 DIAGNOSIS — F411 Generalized anxiety disorder: Secondary | ICD-10-CM

## 2014-04-07 DIAGNOSIS — R635 Abnormal weight gain: Secondary | ICD-10-CM

## 2014-04-07 MED ORDER — VENLAFAXINE HCL ER 37.5 MG PO CP24: 37.5000 mg | ORAL_CAPSULE | Freq: Every day | ORAL | Status: DC

## 2014-04-07 MED ORDER — ALPRAZOLAM 0.5 MG PO TABS: 0.2500 mg | ORAL_TABLET | Freq: Two times a day (BID) | ORAL | Status: DC | PRN

## 2014-04-07 NOTE — Progress Notes (Signed)
Subjective:    Patient ID: Misty Howell, female    DOB: 11/03/1966, 48 y.o.   MRN: [id number]  HPI 48 year old caucasian female presents today to establish care. She denies any acute issues or pain at this time.  Her medication list includes xanax, which she reports taking to relieve work-associated anxiety. She reports struggling with weight gains of up to 18 pounds per year, however she is watching her diet and actively trying to lose weight.    Review of Systems  Constitutional: Negative.   HENT: Negative.   Eyes: Negative.   Respiratory: Negative.   Cardiovascular: Negative.   Gastrointestinal: Negative.   Endocrine: Negative.   Genitourinary: Negative.   Musculoskeletal: Negative.   Skin: Negative.   Allergic/Immunologic: Negative.   Neurological: Negative.   Hematological: Negative.   Psychiatric/Behavioral:       Work associated stressors.    Past Medical History  Diagnosis Date  . Hypercholesterolemia   . Overweight   . Vitamin D deficiency   . Anxiety     History   Social History  . Marital Status: Married    Spouse Name: N/A    Number of Children: N/A  . Years of Education: N/A   Occupational History  . Not on file.   Social History Main Topics  . Smoking status: Former Smoker -- 3 years    Quit date: 11/10/1988  . Smokeless tobacco: Not on file  . Alcohol Use: Yes     Comment: social  . Drug Use: No  . Sexual Activity: Not on file   Other Topics Concern  . Not on file   Social History Narrative  . No narrative on file    Past Surgical History  Procedure Laterality Date  . Back surgery      Family History  Problem Relation Age of Onset  . Hypertension Mother   . Migraines Mother   . Hypertension Father     No Known Allergies  Current Outpatient Prescriptions on File Prior to Visit  Medication Sig Dispense Refill  . ALPRAZolam (XANAX) 0.5 MG tablet Take 0.25-0.5 mg by mouth 2 (two) times daily as needed for anxiety.      .  calcium carbonate (OS-CAL) 600 MG TABS Take 600 mg by mouth daily.        . cholecalciferol (VITAMIN D) 1000 UNITS tablet Take 1,000 Units by mouth daily.        . Multiple Vitamins-Minerals (MULTIVITAMIN & MINERAL PO) Take 1 tablet by mouth daily.         No current facility-administered medications on file prior to visit.    BP 102/80  Pulse 82  Temp(Src) 98.5 F (36.9 C) (Oral)  Wt 194 lb (87.998 kg)  SpO2 98%chart      Objective:   Physical Exam  Constitutional: She is oriented to person, place, and time. She appears well-developed and well-nourished. No distress.  HENT:  Head: Normocephalic and atraumatic.  Neck: Normal range of motion. Neck supple. No thyromegaly present.  Cardiovascular: Normal rate, regular rhythm and normal heart sounds.   Pulmonary/Chest: Effort normal and breath sounds normal.  Abdominal: Soft. Bowel sounds are normal.  Neurological: She is alert and oriented to person, place, and time.  Skin: Skin is warm and dry.  Psychiatric: She has a normal mood and affect. Her behavior is normal. Judgment and thought content normal.       Assessment & Plan:  Misty Howell was seen today for establish care.  Diagnoses and associated orders for this visit:  Generalized anxiety disorder  Weight gain  Other Orders - venlafaxine XR (EFFEXOR XR) 37.5 MG 24 hr capsule; Take 1 capsule (37.5 mg total) by mouth daily with breakfast. - ALPRAZolam (XANAX) 0.5 MG tablet; Take 0.5-1 tablets (0.25-0.5 mg total) by mouth 2 (two) times daily as needed for anxiety.

## 2014-04-07 NOTE — Progress Notes (Signed)
Pre visit review using our clinic review tool, if applicable. No additional management support is needed unless otherwise documented below in the visit note. 

## 2014-04-07 NOTE — Patient Instructions (Signed)
Stress Management Stress is a state of physical or mental tension that often results from changes in your life or normal routine. Some common causes of stress are:  Death of a loved one.  Injuries or severe illnesses.  Getting fired or changing jobs.  Moving into a new home. Other causes may be:  Sexual problems.  Business or financial losses.  Taking on a large debt.  Regular conflict with someone at home or at work.  Constant tiredness from lack of sleep. It is not just bad things that are stressful. It may be stressful to:  Win the lottery.  Get married.  Buy a new car. The amount of stress that can be easily tolerated varies from person to person. Changes generally cause stress, regardless of the types of change. Too much stress can affect your health. It may lead to physical or emotional problems. Too little stress (boredom) may also become stressful. SUGGESTIONS TO REDUCE STRESS:  Talk things over with your family and friends. It often is helpful to share your concerns and worries. If you feel your problem is serious, you may want to get help from a professional counselor.  Consider your problems one at a time instead of lumping them all together. Trying to take care of everything at once may seem impossible. List all the things you need to do and then start with the most important one. Set a goal to accomplish 2 or 3 things each day. If you expect to do too many in a single day you will naturally fail, causing you to feel even more stressed.  Do not use alcohol or drugs to relieve stress. Although you may feel better for a short time, they do not remove the problems that caused the stress. They can also be habit forming.  Exercise regularly - at least 3 times per week. Physical exercise can help to relieve that "uptight" feeling and will relax you.  The shortest distance between despair and hope is often a good night's sleep.  Go to bed and get up on time allowing  yourself time for appointments without being rushed.  Take a short "time-out" period from any stressful situation that occurs during the day. Close your eyes and take some deep breaths. Starting with the muscles in your face, tense them, hold it for a few seconds, then relax. Repeat this with the muscles in your neck, shoulders, hand, stomach, back and legs.  Take good care of yourself. Eat a balanced diet and get plenty of rest.  Schedule time for having fun. Take a break from your daily routine to relax. HOME CARE INSTRUCTIONS   Call if you feel overwhelmed by your problems and feel you can no longer manage them on your own.  Return immediately if you feel like hurting yourself or someone else. Document Released: 04/22/2001 Document Revised: 01/19/2012 Document Reviewed: 06/21/2013 Exit[employer] Patient Information 2014 [employer].  

## 2014-05-04 ENCOUNTER — Encounter: Payer: No Typology Code available for payment source | Admitting: Family

## 2014-05-09 ENCOUNTER — Other Ambulatory Visit: Payer: Self-pay | Admitting: Family

## 2014-05-10 ENCOUNTER — Telehealth: Payer: Self-pay | Admitting: Pulmonary Disease

## 2014-05-10 NOTE — Telephone Encounter (Signed)
Spoke with pt and she stated that the was seen by Dr. Megan Salon at [facility].  She stated that she did not feel comfortable with Dr. Megan Salon and wanted to be changed to another doctor.  I advised her that she may need to call the [facility]  Office to see what they require the pts to do to change MD's.  i also gave her the number to [facility] to see if they can offer her a new primary care doctor.  Nothing further is needed.

## 2014-05-17 ENCOUNTER — Telehealth: Payer: Self-pay | Admitting: Family

## 2014-05-17 MED ORDER — CLONAZEPAM 0.5 MG PO TABS: 0.5000 mg | ORAL_TABLET | Freq: Two times a day (BID) | ORAL | Status: DC | PRN

## 2014-05-17 MED ORDER — PAROXETINE HCL 10 MG PO TABS: 10.0000 mg | ORAL_TABLET | Freq: Every day | ORAL | Status: DC

## 2014-05-17 NOTE — Telephone Encounter (Signed)
I spoke t opt about her call to Dr. Jeannine Kitten office about not wanting to see [facility] anymore. Pt states that it's not that she didn't like [facility] but she felt like see was viewed as being addicted to xanax and she doesn't feel like she is. I advised pt that [facility] does not Rx xanax regularly to any pt due to it's dependence factor. She went on to say that she is used to Dr. Lenna Gilford because he Rx'd it for her with no problem and that she was just calling to let us know that she had stopped taking the Effexor because of how it made her feel. I advised pt of [facility]'s note and she said that "she Abby Potash) can do whatever she wants" at which time I explained that it is up to her on whether or not she wants to try the new prescptions. Pt does want to give them a try.  Per Abby Potash, send Klonopin 0.5mg  #30 1 tab bid prn anxiety and Paxil 10mg  qd. Rxs sent and pt aware

## 2014-05-17 NOTE — Addendum Note (Signed)
Addended by: Santiago Bumpers on: 05/17/2014 04:55 PM   Modules accepted: Orders

## 2014-05-17 NOTE — Telephone Encounter (Signed)
Patient Information:  Caller Name: Hendrix  Phone: [phone]  Patient: Misty Howell  Gender: Female  DOB: 12-Sep-1966  Age: 48 Years  PCP: [facility])  Pregnant: No  Office Follow Up:  Does the office need to follow up with this patient?: Yes  Instructions For The Office: Please f/u with pt concerning medication refill.  Thank you.  RN Note:  Scheduled appt for pt 4196 Monday 05/29/14.  Symptoms  Reason For Call & Symptoms: Pt states she was given Rx Effexor for anxiety.  Pt reports medication caused vomiting, headache and she stopped medication 05/15/14.    Pt also reports she needs medication for anxiety not depression.  Pt requesting refill on Alprazolam unitl she can get appt in the office.  Pt also reports she just started new job this week and can not miss work to come in this week.  Reviewed Health History In EMR: Yes  Reviewed Medications In EMR: Yes  Reviewed Allergies In EMR: Yes  Reviewed Surgeries / Procedures: Yes  Date of Onset of Symptoms: 05/15/2014 OB / GYN:  LMP: Unknown  Guideline(s) Used:  No Protocol Available - Information Only  Disposition Per Guideline:   Discuss with PCP and Callback by Nurse Today  Reason For Disposition Reached:   Nursing judgment  Advice Given:  Call Back If:  New symptoms develop  You become worse.  Patient Refused Recommendation:  Patient Requests Prescription  Refill on alprazolam

## 2014-05-17 NOTE — Telephone Encounter (Signed)
I do not typically prescribe Xanax because of potential for abuse/dependence. She was given #30 with 1 refill. We can call in another medication for anxiety (Paxil 10 mg) and a temporary supply klonopin short-term. Please let me know if she agrees

## 2014-05-17 NOTE — Telephone Encounter (Signed)
Please advise 

## 2014-05-17 NOTE — Telephone Encounter (Signed)
Pt states that she feels like [facility] misdiagnosed her with depression because she read online about Effexor being for manic depression.

## 2014-05-29 ENCOUNTER — Ambulatory Visit: Payer: Self-pay | Admitting: Family

## 2014-06-01 ENCOUNTER — Other Ambulatory Visit: Payer: Self-pay | Admitting: Family

## 2014-06-16 ENCOUNTER — Other Ambulatory Visit: Payer: Self-pay | Admitting: Obstetrics and Gynecology

## 2014-06-16 DIAGNOSIS — R928 Other abnormal and inconclusive findings on diagnostic imaging of breast: Secondary | ICD-10-CM

## 2014-06-22 ENCOUNTER — Ambulatory Visit
Admission: RE | Admit: 2014-06-22 | Discharge: 2014-06-22 | Disposition: A | Discharge status: Home / Self Care | Payer: No Typology Code available for payment source | Source: Ambulatory Visit | Attending: Obstetrics and Gynecology | Admitting: Obstetrics and Gynecology

## 2014-06-22 ENCOUNTER — Encounter (INDEPENDENT_AMBULATORY_CARE_PROVIDER_SITE_OTHER): Payer: Self-pay

## 2014-06-22 DIAGNOSIS — R928 Other abnormal and inconclusive findings on diagnostic imaging of breast: Secondary | ICD-10-CM

## 2014-10-24 ENCOUNTER — Telehealth: Payer: Self-pay | Admitting: Pulmonary Disease

## 2014-10-24 DIAGNOSIS — E785 Hyperlipidemia, unspecified: Secondary | ICD-10-CM

## 2014-10-24 NOTE — Telephone Encounter (Signed)
Spoke with the pt  She is asking for referral to Dr Oswald Hillock- he will be her new PCP  Order sent to [facility]

## 2016-05-02 ENCOUNTER — Other Ambulatory Visit: Payer: Self-pay

## 2016-05-02 ENCOUNTER — Emergency Department (HOSPITAL_COMMUNITY)
Admission: EM | Admit: 2016-05-02 | Discharge: 2016-05-02 | Disposition: A | Discharge status: Home / Self Care | Payer: 59 | Attending: Emergency Medicine | Admitting: Emergency Medicine

## 2016-05-02 ENCOUNTER — Encounter (HOSPITAL_COMMUNITY): Payer: Self-pay

## 2016-05-02 DIAGNOSIS — Z79899 Other long term (current) drug therapy: Secondary | ICD-10-CM | POA: Insufficient documentation

## 2016-05-02 DIAGNOSIS — W19XXXA Unspecified fall, initial encounter: Secondary | ICD-10-CM | POA: Insufficient documentation

## 2016-05-02 DIAGNOSIS — Y9301 Activity, walking, marching and hiking: Secondary | ICD-10-CM | POA: Diagnosis not present

## 2016-05-02 DIAGNOSIS — Z87891 Personal history of nicotine dependence: Secondary | ICD-10-CM | POA: Insufficient documentation

## 2016-05-02 DIAGNOSIS — Y9289 Other specified places as the place of occurrence of the external cause: Secondary | ICD-10-CM | POA: Diagnosis not present

## 2016-05-02 DIAGNOSIS — R569 Unspecified convulsions: Secondary | ICD-10-CM

## 2016-05-02 DIAGNOSIS — S0992XA Unspecified injury of nose, initial encounter: Secondary | ICD-10-CM | POA: Diagnosis not present

## 2016-05-02 DIAGNOSIS — Y990 Civilian activity done for income or pay: Secondary | ICD-10-CM | POA: Diagnosis not present

## 2016-05-02 LAB — CBC WITH DIFFERENTIAL/PLATELET
BASOS ABS: 0 10*3/uL (ref 0.0–0.1)
Basophils Relative: 0 %
EOS PCT: 0 %
Eosinophils Absolute: 0 10*3/uL (ref 0.0–0.7)
HEMATOCRIT: 47 % — AB (ref 36.0–46.0)
Hemoglobin: 15.7 g/dL — ABNORMAL HIGH (ref 12.0–15.0)
LYMPHS ABS: 1.3 10*3/uL (ref 0.7–4.0)
LYMPHS PCT: 13 %
MCH: 29.9 pg (ref 26.0–34.0)
MCHC: 33.4 g/dL (ref 30.0–36.0)
MCV: 89.5 fL (ref 78.0–100.0)
MONO ABS: 0.6 10*3/uL (ref 0.1–1.0)
MONOS PCT: 5 %
NEUTROS ABS: 8.4 10*3/uL — AB (ref 1.7–7.7)
Neutrophils Relative %: 82 %
PLATELETS: 241 10*3/uL (ref 150–400)
RBC: 5.25 MIL/uL — ABNORMAL HIGH (ref 3.87–5.11)
RDW: 12.9 % (ref 11.5–15.5)
WBC: 10.3 10*3/uL (ref 4.0–10.5)

## 2016-05-02 LAB — URINALYSIS, ROUTINE W REFLEX MICROSCOPIC
GLUCOSE, UA: 500 mg/dL — AB
HGB URINE DIPSTICK: NEGATIVE
KETONES UR: 15 mg/dL — AB
LEUKOCYTES UA: NEGATIVE
Nitrite: NEGATIVE
PH: 5 (ref 5.0–8.0)
PROTEIN: 100 mg/dL — AB
Specific Gravity, Urine: 1.035 — ABNORMAL HIGH (ref 1.005–1.030)

## 2016-05-02 LAB — COMPREHENSIVE METABOLIC PANEL
ALBUMIN: 4.8 g/dL (ref 3.5–5.0)
ALK PHOS: 91 U/L (ref 38–126)
ALT: 42 U/L (ref 14–54)
AST: 34 U/L (ref 15–41)
Anion gap: 12 (ref 5–15)
BUN: 13 mg/dL (ref 6–20)
CALCIUM: 9.9 mg/dL (ref 8.9–10.3)
CO2: 24 mmol/L (ref 22–32)
CREATININE: 0.85 mg/dL (ref 0.44–1.00)
Chloride: 101 mmol/L (ref 101–111)
GFR calc Af Amer: >60 mL/min (ref >60)
GFR calc non Af Amer: >60 mL/min (ref >60)
GLUCOSE: 196 mg/dL — AB (ref 65–99)
Potassium: 3.7 mmol/L (ref 3.5–5.1)
Sodium: 137 mmol/L (ref 135–145)
Total Bilirubin: 0.6 mg/dL (ref 0.3–1.2)
Total Protein: 8.8 g/dL — ABNORMAL HIGH (ref 6.5–8.1)

## 2016-05-02 LAB — URINE MICROSCOPIC-ADD ON: RBC / HPF: NONE SEEN RBC/hpf (ref 0–5)

## 2016-05-02 LAB — I-STAT BETA HCG BLOOD, ED (MC, WL, AP ONLY): I-stat hCG, quantitative: <5 m[IU]/mL (ref <5)

## 2016-05-02 LAB — LIPASE, BLOOD: LIPASE: 22 U/L (ref 11–51)

## 2016-05-02 MED ORDER — LEVETIRACETAM 500 MG PO TABS: 500.0000 mg | ORAL_TABLET | Freq: Two times a day (BID) | ORAL | Status: DC

## 2016-05-02 MED ORDER — ALPRAZOLAM 0.5 MG PO TABS: 0.2500 mg | ORAL_TABLET | Freq: Two times a day (BID) | ORAL | Status: DC | PRN

## 2016-05-02 MED ORDER — SODIUM CHLORIDE 0.9 % IV BOLUS (SEPSIS)
1000.0000 mL | Freq: Once | INTRAVENOUS | Status: AC
  Administered 2016-05-02: 1000 mL via INTRAVENOUS

## 2016-05-02 MED ORDER — LEVETIRACETAM 500 MG PO TABS
500.0000 mg | ORAL_TABLET | Freq: Once | ORAL | Status: AC
  Administered 2016-05-02: 500 mg via ORAL
  Filled 2016-05-02: qty 1

## 2016-05-02 MED ORDER — LORAZEPAM 2 MG/ML IJ SOLN
1.0000 mg | Freq: Once | INTRAMUSCULAR | Status: AC
  Administered 2016-05-02: 1 mg via INTRAVENOUS
  Filled 2016-05-02: qty 1

## 2016-05-02 MED ORDER — ALPRAZOLAM 0.5 MG PO TABS
0.5000 mg | ORAL_TABLET | Freq: Once | ORAL | Status: AC
  Administered 2016-05-02: 0.5 mg via ORAL
  Filled 2016-05-02: qty 1

## 2016-05-02 NOTE — ED Provider Notes (Signed)
CSN: [id number]     Arrival date & time 05/02/16  1814 History   First MD Initiated Contact with Patient 05/02/16 1816     Chief Complaint  Patient presents with  . Seizures     (Consider location/radiation/quality/duration/timing/severity/associated sxs/prior Treatment) HPI Patient presents after an episode of loss of consciousness, unclear circumstances. The patient recalls being at work, being in her usual state of health, walking into an office and feeling lightheaded,then awakening in the office   per report the patient had shaking, urinary incontinence, vomiting  The patient denies history of seizures, syncope, states that she has a history of anxiety, takes Xanax, last dose earlier today. She does have mild pain about her nose, where she fell, but otherwise denies pain, lightheadedness, other current complaints. She has a notable recent health changes actively dieting.   Past Medical History  Diagnosis Date  . Hypercholesterolemia   . Overweight(278.02)   . Vitamin D deficiency   . Anxiety    Past Surgical History  Procedure Laterality Date  . Back surgery     Family History  Problem Relation Age of Onset  . Hypertension Mother   . Migraines Mother   . Hypertension Father    Social History  Substance Use Topics  . Smoking status: Former Smoker -- 3 years    Quit date: 11/10/1988  . Smokeless tobacco: None  . Alcohol Use: Yes     Comment: social   OB History    No data available     Review of Systems  Constitutional:       Per HPI, otherwise negative  HENT:       Per HPI, otherwise negative  Respiratory:       Per HPI, otherwise negative  Cardiovascular:       Per HPI, otherwise negative  Gastrointestinal: Negative for vomiting.  Endocrine:       Negative aside from HPI  Genitourinary:       Neg aside from HPI   Musculoskeletal:       Per HPI, otherwise negative  Skin: Negative.   Neurological: Positive for seizures. Negative for syncope.       Allergies  Review of patient's allergies indicates no known allergies.  Home Medications   Prior to Admission medications   Medication Sig Start Date End Date Taking? Authorizing Provider  ALPRAZolam Duanne Moron) 0.5 MG tablet Take 0.5-1 tablets (0.25-0.5 mg total) by mouth 2 (two) times daily as needed for anxiety. 04/07/14   Kennyth Arnold, FNP  calcium carbonate (OS-CAL) 600 MG TABS Take 600 mg by mouth daily.      Historical Provider, MD  cholecalciferol (VITAMIN D) 1000 UNITS tablet Take 1,000 Units by mouth daily.      Historical Provider, MD  clonazePAM (KLONOPIN) 0.5 MG tablet Take 1 tablet (0.5 mg total) by mouth 2 (two) times daily as needed for anxiety. 05/17/14   Kennyth Arnold, FNP  Multiple Vitamins-Minerals (MULTIVITAMIN & MINERAL PO) Take 1 tablet by mouth daily.      Historical Provider, MD  PARoxetine (PAXIL) 10 MG tablet Take 1 tablet (10 mg total) by mouth daily. 05/17/14   Kennyth Arnold, FNP  venlafaxine XR (EFFEXOR-XR) 37.5 MG 24 hr capsule TAKE 1 CAPSULE (37.5 MG TOTAL) BY MOUTH DAILY WITH BREAKFAST. 06/01/14   Kennyth Arnold, FNP   There were no vitals taken for this visit. Physical Exam  Constitutional: She is oriented to person, place, and time. She appears well-developed and well-nourished. No  distress.  HENT:  Head: Normocephalic and atraumatic.    Eyes: Conjunctivae and EOM are normal.  Cardiovascular: Normal rate and regular rhythm.   Pulmonary/Chest: Effort normal and breath sounds normal. No stridor. No respiratory distress.  Abdominal: She exhibits no distension.  Musculoskeletal: She exhibits no edema.  Neurological: She is alert and oriented to person, place, and time. She displays no atrophy and no tremor. No cranial nerve deficit. She exhibits normal muscle tone. She displays no seizure activity. Coordination normal.  Skin: Skin is warm and dry.  Psychiatric: She has a normal mood and affect.  Nursing note and vitals reviewed.   ED Course   Procedures (including critical care time) Labs Review Labs Reviewed  COMPREHENSIVE METABOLIC PANEL - Abnormal; Notable for the following:    Glucose, Bld 196 (*)    Total Protein 8.8 (*)    All other components within normal limits  CBC WITH DIFFERENTIAL/PLATELET - Abnormal; Notable for the following:    RBC 5.25 (*)    Hemoglobin 15.7 (*)    HCT 47.0 (*)    Neutro Abs 8.4 (*)    All other components within normal limits  URINALYSIS, ROUTINE W REFLEX MICROSCOPIC (NOT AT [facility]) - Abnormal; Notable for the following:    Color, Urine AMBER (*)    APPearance TURBID (*)    Specific Gravity, Urine 1.035 (*)    Glucose, UA 500 (*)    Bilirubin Urine SMALL (*)    Ketones, ur 15 (*)    Protein, ur 100 (*)    All other components within normal limits  URINE MICROSCOPIC-ADD ON - Abnormal; Notable for the following:    Squamous Epithelial / LPF 6-30 (*)    Bacteria, UA FEW (*)    Casts HYALINE CASTS (*)    All other components within normal limits  LIPASE, BLOOD  I-STAT BETA HCG BLOOD, ED ([facility], [facility], [facility] ONLY)    I have personally reviewed and evaluated these lab results as part of my medical decision-making.   EKG Interpretation   Date/Time:  Friday May 02 2016 18:22:30 EDT Ventricular Rate:  111 PR Interval:    QRS Duration: 100 QT Interval:  338 QTC Calculation: 460 R Axis:   95 Text Interpretation:  Sinus tachycardia Borderline right axis deviation  Borderline T abnormalities, diffuse leads Abnormal ekg Confirmed by  Carmin Muskrat  MD [phone]) on 05/02/2016 7:00:42 PM      Chart review notable for emergency department evaluation 2 years ago after similar circumstances. During that visit she was evaluated by neurology for possible seizure activity. EEG did not demonstrate obvious seizure activity.  9:49 PM Patient awake and alert, no new complaints. We discussed all findings at length. We also discussed the patient's prior episode of similar seizure-like  activity. Discussed return precautions, following up with neurology, ENT for her nose trauma i advised the patient that she cannot drive unless he began antiseizure medication. Patient is amenable to this.    MDM  Patient presents after an episode of changing consciousness consistent with seizure activity.some consideration of syncope, though this seems less likely given the EKG,absence of prior arrhythmia. Given the patient's prior similar Episode, there is some  For paroxysmal seizures, and the patient will be started on antiepileptic, pending outpatient neurology follow-up. Patient encouraged to drink fluids, take all medication as directed, follow up with neurology and ENT.   Carmin Muskrat, MD 05/02/16 2153

## 2016-05-02 NOTE — Discharge Instructions (Signed)
As discussed, with concern for ongoing seizures please take all medication as directed and be sure to follow-up with our neurologist.  Please stay well hydrated, monitor your condition carefully, and it do not hesitate to return here for concerning changes in your condition.

## 2016-05-02 NOTE — ED Notes (Signed)
Pt was at work and fell face first on the floor, she had a grand mal seizure lasting 2 minutes, the patient had vomiting and lost control of her urine, the patient denies a history of seizures, patients nose has an abrasion from the fall, ice pack has been applied to her head, blood sugar 158, patient is alert and oriented on arrival to ed

## 2016-05-14 ENCOUNTER — Ambulatory Visit (INDEPENDENT_AMBULATORY_CARE_PROVIDER_SITE_OTHER): Payer: 59 | Admitting: Neurology

## 2016-05-14 ENCOUNTER — Encounter: Payer: Self-pay | Admitting: Neurology

## 2016-05-14 VITALS — BP 130/90 | HR 84 | Resp 16 | Ht 64.0 in | Wt 197.0 lb

## 2016-05-14 DIAGNOSIS — E559 Vitamin D deficiency, unspecified: Secondary | ICD-10-CM | POA: Diagnosis not present

## 2016-05-14 DIAGNOSIS — R569 Unspecified convulsions: Secondary | ICD-10-CM

## 2016-05-14 MED ORDER — LAMOTRIGINE 100 MG PO TABS: 100.0000 mg | ORAL_TABLET | Freq: Two times a day (BID) | ORAL | Status: DC

## 2016-05-14 MED ORDER — LAMOTRIGINE 25 MG PO TABS: ORAL_TABLET | ORAL | Status: DC

## 2016-05-14 NOTE — Progress Notes (Signed)
PATIENT: Misty Howell DOB: 02-17-1966  Chief Complaint  Patient presents with  . Seizure Like Activity    Sts. 2 weeks ago at work she had an episode of altered awareness, nonsensical speech, n/v, jerking. Witnessed by coworkers. Sts. prior to episode, she had been dieting, had eaten very little, believes she was dehydrated.  She was seen and treated at [facility] ER, started on Keppra.  Sts. she was told by the ER physician that as long as she takes Keppra she can continue driving./fim     HISTORICAL  Misty Howell a 50 years old right-handed female, seen in refer by  Nurse practitioner Kennyth Arnold for evaluation of passing out episode on May 15 2016.  She had a history of anxiety,single working mother,reported recurrent episode of passing out The first episode was at age 56,she was ice skating, she suddenly saw her surrounding turns blue,she passed out, there was seizure-like activity described, The second episode was at age 69, she was on a boat trip, have not been eating and sleeping well for a few days, she also suffered anorexia at that time, she had dizziness, passed out, she had witnessed seizure like activity. The third episode was in January 2015, she was at work, during that period of time she had excessive weight loss 40 pounds over 2 months, she was not well hydrated, while she was at the sitting position, she was found to be confused, fell off the chair, had weakness seizure activity, was taken by ambulance to the emergency room,  The first episode was on June 20 third 2017, after a day of work, around 5:10 PM, she was ready to leave, while she was talking with her coworker, she suddenly made the comment," everything looks so pretty", Then she passed out, landed on the floor face down, noted body jerking movement, vomiting, she was put on the side of her body, her eyes stroke back, continued with body jerking movement, paramedic was called, she was noted to have post  event confusion, followed by severe headaches, about history is confirmed by calling her coworker Dorothy Puffer at 3 3 6 3 4  1, 2 7 4 2   She was taken to the emergency room, I personally reviewed laboratory evaluation on May 02 2016, urine showed amber color, increased gravity, positive ketone, no significant abnormality on CBC, BMP, I personally reviewed MRI of the brain with and without contrast in January 2015, MRA of the brain then that was normal.   Patient reported that she has been trying to lose weight, has not been eating well for a few days, have not Herself well-hydrated either.  She was started on Keppra 500 mg twice a day, she complains of mood disorder, with switch her over to lamotrigine titrating to 100 mg twice a day  REVIEW OF SYSTEMS: Full 14 system review of systems performed and notable only for as above  ALLERGIES: No Known Allergies  HOME MEDICATIONS: Current Outpatient Prescriptions  Medication Sig Dispense Refill  . ALPRAZolam (XANAX) 0.5 MG tablet Take 0.5-1 tablets (0.25-0.5 mg total) by mouth 2 (two) times daily as needed for anxiety. 15 tablet 0  . Artificial Tear Solution (SYSTANE CONTACTS OP) Place 1-2 drops into both eyes 2 (two) times daily as needed (as needed for dryness).    Marland Kitchen levETIRAcetam (KEPPRA) 500 MG tablet Take 1 tablet (500 mg total) by mouth 2 (two) times daily. 60 tablet 0  . clonazePAM (KLONOPIN) 0.5 MG tablet Take 1  tablet (0.5 mg total) by mouth 2 (two) times daily as needed for anxiety. (Patient not taking: Reported on 05/02/2016) 30 tablet 0  . PARoxetine (PAXIL) 10 MG tablet Take 1 tablet (10 mg total) by mouth daily. (Patient not taking: Reported on 05/02/2016) 30 tablet 1  . venlafaxine XR (EFFEXOR-XR) 37.5 MG 24 hr capsule TAKE 1 CAPSULE (37.5 MG TOTAL) BY MOUTH DAILY WITH BREAKFAST. (Patient not taking: Reported on 05/02/2016) 30 capsule 1   No current facility-administered medications for this visit.    PAST MEDICAL HISTORY: Past  Medical History  Diagnosis Date  . Hypercholesterolemia   . Overweight(278.02)   . Vitamin D deficiency   . Anxiety   . Vision abnormalities   . Cancer ([place])   . Syncope and collapse     PAST SURGICAL HISTORY: Past Surgical History  Procedure Laterality Date  . Back surgery      FAMILY HISTORY: Family History  Problem Relation Age of Onset  . Hypertension Mother   . Migraines Mother   . Hypertension Father     SOCIAL HISTORY:  Social History   Social History  . Marital Status: Divorced.    Spouse Name: N/A  . Number of Children: 2  . Years of Education: N/A   Occupational History  . Accounting assistant   Social History Main Topics  . Smoking status: Former Smoker -- 3 years    Quit date: 11/10/1988  . Smokeless tobacco: Not on file  . Alcohol Use: Yes     Comment: social  . Drug Use: No  . Sexual Activity: Not on file   Other Topics Concern  . Not on file   Social History Narrative     PHYSICAL EXAM   Filed Vitals:   05/14/16 0743  BP: 130/90  Pulse: 84  Resp: 16  Height: 5\' 4"  (1.626 m)  Weight: 197 lb (89.359 kg)    Not recorded      Body mass index is 33.8 kg/(m^2).  PHYSICAL EXAMNIATION:  Gen: NAD, conversant, well nourised, obese, well groomed                     Cardiovascular: Regular rate rhythm, no peripheral edema, warm, nontender. Eyes: Conjunctivae clear without exudates or hemorrhage Neck: Supple, no carotid bruise. Pulmonary: Clear to auscultation bilaterally   NEUROLOGICAL EXAM:  MENTAL STATUS: Speech:    Speech is normal; fluent and spontaneous with normal comprehension.  Cognition:     Orientation to time, place and person     Normal recent and remote memory     Normal Attention span and concentration     Normal Language, naming, repeating,spontaneous speech     Fund of knowledge   CRANIAL NERVES: CN II: Visual fields are full to confrontation. Fundoscopic exam is normal with sharp discs and no vascular  changes. Pupils are round equal and briskly reactive to light. CN III, IV, VI: extraocular movement are normal. No ptosis. CN V: Facial sensation is intact to pinprick in all 3 divisions bilaterally. Corneal responses are intact.  CN VII: Face is symmetric with normal eye closure and smile. CN VIII: Hearing is normal to rubbing fingers CN IX, X: Palate elevates symmetrically. Phonation is normal. CN XI: Head turning and shoulder shrug are intact CN XII: Tongue is midline with normal movements and no atrophy.  MOTOR: There is no pronator drift of out-stretched arms. Muscle bulk and tone are normal. Muscle strength is normal.  REFLEXES: Reflexes are 2+ and  symmetric at the biceps, triceps, knees, and ankles. Plantar responses are flexor.  SENSORY: Intact to light touch, pinprick, positional sensation and vibratory sensation are intact in fingers and toes.  COORDINATION: Rapid alternating movements and fine finger movements are intact. There is no dysmetria on finger-to-nose and heel-knee-shin.    GAIT/STANCE: Posture is normal. Gait is steady with normal steps, base, arm swing, and turning. Heel and toe walking are normal. Tandem gait is normal.  Romberg is absent.   DIAGNOSTIC DATA (LABS, IMAGING, TESTING) - I reviewed patient records, labs, notes, testing and imaging myself where available.   ASSESSMENT AND PLAN  Misty Howell is a 50 y.o. female   Recurrent passing out episode with seizure-like activity  Epilepsy versus syncope  She was started on Keppra 500 mg twice a day, she complains of mood disorder, with switch her over to lamotrigine titrating to 100 mg twice a day  EEG  No driving until episode free for 6 months   Marcial Pacas, M.D. Ph.D.  [facility] [address], [place], [place] [address] Ph: [phone] Fax: [phone]  CC: Kennyth Arnold, FNP

## 2016-05-14 NOTE — Patient Instructions (Signed)
Am  Pm Keppra 500mg   1  1 1st week  1  1 2nd week  0  1 3rd week  0  0  Lamotrigine  1 tablet twice a day for the first week 2 tablets twice a day for the second week 3 tablets twice a day for the third week 4 tablets twice a day for the fourth week  After finish titration with small dose of lamotrigine 25 mg, change to lamotrigine 100 mg twice a day at second prescribtion

## 2016-06-09 ENCOUNTER — Other Ambulatory Visit: Payer: Self-pay | Admitting: Neurology

## 2016-06-10 ENCOUNTER — Ambulatory Visit (INDEPENDENT_AMBULATORY_CARE_PROVIDER_SITE_OTHER): Payer: Self-pay

## 2016-06-10 DIAGNOSIS — R569 Unspecified convulsions: Secondary | ICD-10-CM

## 2016-06-10 DIAGNOSIS — Z0289 Encounter for other administrative examinations: Secondary | ICD-10-CM

## 2016-06-10 DIAGNOSIS — E559 Vitamin D deficiency, unspecified: Secondary | ICD-10-CM

## 2016-06-18 ENCOUNTER — Ambulatory Visit (INDEPENDENT_AMBULATORY_CARE_PROVIDER_SITE_OTHER): Payer: 59

## 2016-06-18 DIAGNOSIS — R569 Unspecified convulsions: Secondary | ICD-10-CM | POA: Diagnosis not present

## 2016-06-18 NOTE — Procedures (Signed)
     History: Misty Howell is a 50 year old patient with a history of passing out episodes beginning around age 16. The patient has had seizure-type activity associated with these events. The patient has had a recent episode on 04/29/2016. The patient passed out at work, had jerking of the body, vomiting. The patient had confusion following the event. The patient is being evaluated for possible seizures.  This is a routine EEG. No skull defects are noted. Medications include alprazolam, Keppra, clonazepam, Paxil, and Effexor.  EEG classification: Dysrhythmia grade 2 left frontotemporal  Description of the recording: The background rhythms of this recording consists of a fairly well modulated medium amplitude alpha rhythm of 10 Hz that is reactive to eye opening and closure. As the record progresses, the patient initially is in the waking state, and photic stimulation is performed which results in a minimal but bilateral photic driving response. Hyperventilation is then performed resulting in a slight buildup of the background rhythm activities without significant slowing seen. Towards the end of the recording, the patient appears to drift into the drowsy state with generalized background slowing seen. At this time, occasional events of sharp activity is seen emanating from the left greater than right frontotemporal areas. At no time during the recording does there appear to be evidence of spike or spike-wave discharges. EKG monitor shows no evidence of cardiac rhythm abnormalities with a heart rate of 78.  Impression: This is an abnormal EEG recording secondary to occasional sharp wave activity emanating from the left frontotemporal region occurring during periods of drowsiness. This study suggests a left brain irritable focus with a lowered seizure threshold. No electrographic seizures were recorded. Clinical correlation is required.

## 2016-06-19 ENCOUNTER — Telehealth: Payer: Self-pay | Admitting: Neurology

## 2016-06-19 NOTE — Telephone Encounter (Signed)
EEG was abnormal. There was an area of electrical irritability seen  emanating from the left frontotemporal region occurring during periods of drowsiness. This could indicate a seizure focus. However no electrographic seizures were seen. She should continue her anti-epileptics and follow up with Dr. Krista Blue. thanks

## 2016-06-19 NOTE — Telephone Encounter (Signed)
Patient is calling to get the results of her EEG.

## 2016-06-19 NOTE — Telephone Encounter (Signed)
Dr Jaynee Eagles- This is a Dr Krista Blue pt calling about EEG results. Looks like she had it done yesterday. Please advise   Called pt back. Advised Dr Krista Blue out of office this week. Dr Jaynee Eagles covering provider this pm. I will send message to Dr Jaynee Eagles to see if results ready. Pt verbalized understanding and states she is very anxious. Advised we will call once we know results. She verbalized understanding.

## 2016-06-20 NOTE — Telephone Encounter (Signed)
Spoke to pt and relayed EEG results below, no sz, but irritablility that could indicate sz focus.  Keep on anti sz meds.  She does have refills available at pharmacy.  (she stated that she did excessive dieting when she was younger, may not have eaten much on EEG, as well as taken less then her normal dose of xanax.   She will keep f/u 08-20-16 with Dr. Krista Blue.

## 2016-08-07 ENCOUNTER — Encounter: Payer: Self-pay | Admitting: *Deleted

## 2016-08-07 ENCOUNTER — Other Ambulatory Visit: Payer: Self-pay | Admitting: *Deleted

## 2016-08-07 MED ORDER — LAMOTRIGINE 100 MG PO TABS: 100.0000 mg | ORAL_TABLET | Freq: Two times a day (BID) | ORAL | 3 refills | Status: DC

## 2016-08-20 ENCOUNTER — Ambulatory Visit: Payer: 59 | Admitting: Neurology

## 2016-09-23 ENCOUNTER — Ambulatory Visit: Payer: 59 | Admitting: Neurology

## 2016-09-29 ENCOUNTER — Emergency Department (HOSPITAL_COMMUNITY)
Admission: EM | Admit: 2016-09-29 | Discharge: 2016-09-29 | Disposition: A | Discharge status: Home / Self Care | Payer: 59 | Attending: Emergency Medicine | Admitting: Emergency Medicine

## 2016-09-29 ENCOUNTER — Encounter (HOSPITAL_COMMUNITY): Payer: Self-pay | Admitting: Emergency Medicine

## 2016-09-29 DIAGNOSIS — F17210 Nicotine dependence, cigarettes, uncomplicated: Secondary | ICD-10-CM | POA: Insufficient documentation

## 2016-09-29 DIAGNOSIS — Y929 Unspecified place or not applicable: Secondary | ICD-10-CM | POA: Insufficient documentation

## 2016-09-29 DIAGNOSIS — Y939 Activity, unspecified: Secondary | ICD-10-CM | POA: Insufficient documentation

## 2016-09-29 DIAGNOSIS — S00502A Unspecified superficial injury of oral cavity, initial encounter: Secondary | ICD-10-CM | POA: Diagnosis present

## 2016-09-29 DIAGNOSIS — S00512A Abrasion of oral cavity, initial encounter: Secondary | ICD-10-CM | POA: Diagnosis not present

## 2016-09-29 DIAGNOSIS — Z859 Personal history of malignant neoplasm, unspecified: Secondary | ICD-10-CM | POA: Insufficient documentation

## 2016-09-29 DIAGNOSIS — Z79899 Other long term (current) drug therapy: Secondary | ICD-10-CM | POA: Insufficient documentation

## 2016-09-29 DIAGNOSIS — R569 Unspecified convulsions: Secondary | ICD-10-CM | POA: Diagnosis not present

## 2016-09-29 DIAGNOSIS — X58XXXA Exposure to other specified factors, initial encounter: Secondary | ICD-10-CM | POA: Insufficient documentation

## 2016-09-29 DIAGNOSIS — Y999 Unspecified external cause status: Secondary | ICD-10-CM | POA: Insufficient documentation

## 2016-09-29 LAB — BASIC METABOLIC PANEL
ANION GAP: 13 (ref 5–15)
BUN: 13 mg/dL (ref 6–20)
CHLORIDE: 99 mmol/L — AB (ref 101–111)
CO2: 20 mmol/L — ABNORMAL LOW (ref 22–32)
Calcium: 9.5 mg/dL (ref 8.9–10.3)
Creatinine, Ser: 0.83 mg/dL (ref 0.44–1.00)
Glucose, Bld: 184 mg/dL — ABNORMAL HIGH (ref 65–99)
POTASSIUM: 4.3 mmol/L (ref 3.5–5.1)
SODIUM: 132 mmol/L — AB (ref 135–145)

## 2016-09-29 LAB — RAPID URINE DRUG SCREEN, HOSP PERFORMED
AMPHETAMINES: NOT DETECTED
BENZODIAZEPINES: POSITIVE — AB
Barbiturates: NOT DETECTED
Cocaine: NOT DETECTED
OPIATES: NOT DETECTED
TETRAHYDROCANNABINOL: NOT DETECTED

## 2016-09-29 LAB — CBC WITH DIFFERENTIAL/PLATELET
BASOS ABS: 0 10*3/uL (ref 0.0–0.1)
Basophils Relative: 0 %
Eosinophils Absolute: 0 10*3/uL (ref 0.0–0.7)
Eosinophils Relative: 0 %
HCT: 45 % (ref 36.0–46.0)
HEMOGLOBIN: 15.5 g/dL — AB (ref 12.0–15.0)
LYMPHS ABS: 1.4 10*3/uL (ref 0.7–4.0)
LYMPHS PCT: 10 %
MCH: 30 pg (ref 26.0–34.0)
MCHC: 34.4 g/dL (ref 30.0–36.0)
MCV: 87 fL (ref 78.0–100.0)
Monocytes Absolute: 0.9 10*3/uL (ref 0.1–1.0)
Monocytes Relative: 6 %
NEUTROS PCT: 84 %
Neutro Abs: 12.5 10*3/uL — ABNORMAL HIGH (ref 1.7–7.7)
PLATELETS: 263 10*3/uL (ref 150–400)
RBC: 5.17 MIL/uL — AB (ref 3.87–5.11)
RDW: 12.8 % (ref 11.5–15.5)
WBC: 14.8 10*3/uL — AB (ref 4.0–10.5)

## 2016-09-29 LAB — POC URINE PREG, ED: PREG TEST UR: NEGATIVE

## 2016-09-29 LAB — ETHANOL: Alcohol, Ethyl (B): <5 mg/dL (ref <5)

## 2016-09-29 MED ORDER — ALPRAZOLAM 0.25 MG PO TABS
0.5000 mg | ORAL_TABLET | Freq: Once | ORAL | Status: AC
  Administered 2016-09-29: 0.5 mg via ORAL
  Filled 2016-09-29: qty 2

## 2016-09-29 MED ORDER — SODIUM CHLORIDE 0.9 % IV BOLUS (SEPSIS)
1000.0000 mL | Freq: Once | INTRAVENOUS | Status: AC
  Administered 2016-09-29: 1000 mL via INTRAVENOUS

## 2016-09-29 NOTE — Discharge Instructions (Signed)
Please call your neurologist.  DO NOT DRIVE UNTIL YOU ARE CLEARED BY YOUR NEUROLOGIST.  Continue taking your prescribed medications.
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
## 2016-09-30 LAB — LAMOTRIGINE LEVEL: LAMOTRIGINE LVL: 1.4 ug/mL — AB (ref 2.0–20.0)

## 2016-10-01 ENCOUNTER — Telehealth: Payer: Self-pay | Admitting: Neurology

## 2016-10-01 NOTE — Telephone Encounter (Signed)
Spoke to patient - she has been worked into Dr. Rhea Belton schedule on 10/06/16.

## 2016-10-01 NOTE — Telephone Encounter (Signed)
Pt called said she was seen at [facility] for seizure on Monday 09/29/16. She was advised to be seen sooner, she said she is taking lamoTRIgine (LAMICTAL) 100 MG tablet correctly. She is wanting to be worked in to see Dr Krista Blue. Thank you

## 2016-10-06 ENCOUNTER — Telehealth: Payer: Self-pay | Admitting: Neurology

## 2016-10-06 ENCOUNTER — Ambulatory Visit: Payer: Self-pay | Admitting: Neurology

## 2016-10-06 ENCOUNTER — Telehealth: Payer: Self-pay | Admitting: *Deleted

## 2016-10-06 NOTE — Telephone Encounter (Signed)
Returned call to patient - she has been rescheduled.

## 2016-10-06 NOTE — Telephone Encounter (Signed)
Called one hour prior to appt time - unable to leave work.

## 2016-10-06 NOTE — Telephone Encounter (Signed)
Pt called said she could not miss work and could not keep appt today. She was advised of the no show policy, she became agitated and said she could not call over the weekend bc the office was closed and she would not be paying any fee. I advised her it could be waived 1 time and she could call the answering service over the weekends. She was appreciative.  Pt would like to be schedule for sometime next around her lunch hour.

## 2016-10-27 ENCOUNTER — Telehealth: Payer: Self-pay | Admitting: Neurology

## 2016-10-27 NOTE — Telephone Encounter (Signed)
Pt called to schedule appt with Dr Krista Blue, she booked into March. An appt is schduled with [facility] 1/18.  Pt also said she is not taking keppra per Dr Krista Blue. She wanted it documented.

## 2016-10-27 NOTE — Telephone Encounter (Signed)
Patient schedule with Megan on 11/27/2016. Pt is not taking Keppra per telephone note.

## 2016-10-29 ENCOUNTER — Ambulatory Visit: Payer: Self-pay | Admitting: Neurology

## 2016-11-25 ENCOUNTER — Ambulatory Visit: Payer: 59 | Admitting: Neurology

## 2016-11-27 ENCOUNTER — Ambulatory Visit: Payer: 59 | Admitting: Adult Health

## 2016-12-02 ENCOUNTER — Telehealth: Payer: Self-pay | Admitting: Adult Health

## 2016-12-02 NOTE — Telephone Encounter (Signed)
Called Patient to reschedule her apt . Patient relayed she will have to check her work schedule and call back.

## 2017-02-03 ENCOUNTER — Encounter: Payer: Self-pay | Admitting: Adult Health

## 2017-02-03 ENCOUNTER — Ambulatory Visit (INDEPENDENT_AMBULATORY_CARE_PROVIDER_SITE_OTHER): Payer: 59 | Admitting: Adult Health

## 2017-02-03 VITALS — BP 119/80 | HR 87 | Ht 64.0 in | Wt 220.0 lb

## 2017-02-03 DIAGNOSIS — R569 Unspecified convulsions: Secondary | ICD-10-CM

## 2017-02-03 DIAGNOSIS — Z5181 Encounter for therapeutic drug level monitoring: Secondary | ICD-10-CM

## 2017-02-03 NOTE — Progress Notes (Signed)
PATIENT: Misty Howell DOB: [birth year]  REASON FOR VISIT: follow up- seizure HISTORY FROM: patient  HISTORY OF PRESENT ILLNESS: HISTORY 05/14/16 per Dr. Rhea Belton notes: Misty Howell a 51 years old right-handed female, seen in refer by  Nurse practitioner Kennyth Arnold for evaluation of passing out episode on May 15 2016.  She had a history of anxiety,single working mother,reported recurrent episode of passing out The first episode was at age 48,she was ice skating, she suddenly saw her surrounding turns blue,she passed out, there was seizure-like activity described, The second episode was at age 56, she was on a boat trip, have not been eating and sleeping well for a few days, she also suffered anorexia at that time, she had dizziness, passed out, she had witnessed seizure like activity. The third episode was in January 2015, she was at work, during that period of time she had excessive weight loss 40 pounds over 2 months, she was not well hydrated, while she was at the sitting position, she was found to be confused, fell off the chair, had weakness seizure activity, was taken by ambulance to the emergency room,  The first episode was on June 20 third 2017, after a day of work, around 5:10 PM, she was ready to leave, while she was talking with her coworker, she suddenly made the comment," everything looks so pretty", Then she passed out, landed on the floor face down, noted body jerking movement, vomiting, she was put on the side of her body, her eyes stroke back, continued with body jerking movement, paramedic was called, she was noted to have post event confusion, followed by severe headaches, about history is confirmed by calling her coworker Dorothy Puffer at 3 3 6 3 4  1, 2 7 4 2   She was taken to the emergency room, I personally reviewed laboratory evaluation on May 02 2016, urine showed amber color, increased gravity, positive ketone, no significant abnormality on CBC, BMP, I  personally reviewed MRI of the brain with and without contrast in January 2015, MRA of the brain then that was normal.   Patient reported that she has been trying to lose weight, has not been eating well for a few days, have not Herself well-hydrated either.  She was started on Keppra 500 mg twice a day, she complains of mood disorder, with switch her over to lamotrigine titrating to 100 mg twice a day  Update 02/03/17:  Misty Howell is a 51 year old female with a history of seizures. She returns today for follow-up. The patient states that she had a seizure in November. She has canceled 2 appointments with our office since that seizure. She states that she did skip a meal that day and she did not take Xanax that morning. Unsure if this is the cause for seizure. She states that she did not Misty any doses of Lamictal. She also reports that she did not sleep well the night before. She states in June when she had her first seizure she was dehydrated and had skipped a meal then as well. She states that she has not been operating a motor vehicle. She is able to complete all ADLs independently. Denies any changes with her mood or behavior. Denies any thoughts of harming herself or others. Denies any changes with her gait or balance. She states that she recently broke up with her boyfriend and has been stress eating. Denies any new neurological symptoms. Returns today for an evaluation.    REVIEW OF SYSTEMS:  Out of a complete 14 system review of symptoms, the patient complains only of the following symptoms, and all other reviewed systems are negative.  See history of present illness  ALLERGIES: No Known Allergies  HOME MEDICATIONS: Outpatient Medications Prior to Visit  Medication Sig Dispense Refill  . ALPRAZolam (XANAX) 0.5 MG tablet Take 0.5-1 tablets (0.25-0.5 mg total) by mouth 2 (two) times daily as needed for anxiety. 15 tablet 0  . Artificial Tear Solution (SYSTANE CONTACTS OP) Place 1-2  drops into both eyes 2 (two) times daily as needed (as needed for dryness).    Marland Kitchen lamoTRIgine (LAMICTAL) 100 MG tablet Take 1 tablet (100 mg total) by mouth 2 (two) times daily. 180 tablet 3  . levETIRAcetam (KEPPRA) 500 MG tablet Take 1 tablet (500 mg total) by mouth 2 (two) times daily. (Patient not taking: Reported on 02/03/2017) 60 tablet 0   No facility-administered medications prior to visit.     PAST MEDICAL HISTORY: Past Medical History:  Diagnosis Date  . Anxiety   . Cancer ([place])   . Hypercholesterolemia   . Overweight(278.02)   . Syncope and collapse   . Vision abnormalities   . Vitamin D deficiency     PAST SURGICAL HISTORY: Past Surgical History:  Procedure Laterality Date  . BACK SURGERY    . CESAREAN SECTION      FAMILY HISTORY: Family History  Problem Relation Age of Onset  . Hypertension Mother   . Migraines Mother   . Hypertension Father     SOCIAL HISTORY: Social History   Social History  . Marital status: Married    Spouse name: N/A  . Number of children: N/A  . Years of education: N/A   Occupational History  . Not on file.   Social History Main Topics  . Smoking status: Current Some Day Smoker    Packs/day: 0.20    Years: 3.00    Types: Cigarettes    Last attempt to quit: 11/10/1988  . Smokeless tobacco: Never Used  . Alcohol use Yes     Comment: social  . Drug use: No  . Sexual activity: Not on file   Other Topics Concern  . Not on file   Social History Narrative  . No narrative on file      PHYSICAL EXAM  Vitals:   02/03/17 1351  BP: 119/80  Pulse: 87  Weight: 220 lb (99.8 kg)  Height: 5\' 4"  (1.626 m)   Body mass index is 37.76 kg/m.  Generalized: Well developed, in no acute distress, obese  Neurological examination  Mentation: Alert oriented to time, place, history taking. Follows all commands speech and language fluent Cranial nerve II-XII: Pupils were equal round reactive to light. Extraocular movements were full,  visual field were full on confrontational test. Facial sensation and strength were normal. Uvula tongue midline. Head turning and shoulder shrug  were normal and symmetric. Motor: The motor testing reveals 5 over 5 strength of all 4 extremities. Good symmetric motor tone is noted throughout.  Sensory: Sensory testing is intact to soft touch on all 4 extremities. No evidence of extinction is noted.  Coordination: Cerebellar testing reveals good finger-nose-finger and heel-to-shin bilaterally.  Gait and station: Gait is normal. Tandem gait is normal. Romberg is negative. No drift is seen.  Reflexes: Deep tendon reflexes are symmetric and normal bilaterally.   DIAGNOSTIC DATA (LABS, IMAGING, TESTING) - I reviewed patient records, labs, notes, testing and imaging myself where available.  Lab Results  Component Value  Date   WBC 14.8 (H) 09/29/2016   HGB 15.5 (H) 09/29/2016   HCT 45.0 09/29/2016   MCV 87.0 09/29/2016   PLT 263 09/29/2016      Component Value Date/Time   NA 132 (L) 09/29/2016 1817   K 4.3 09/29/2016 1817   CL 99 (L) 09/29/2016 1817   CO2 20 (L) 09/29/2016 1817   GLUCOSE 184 (H) 09/29/2016 1817   BUN 13 09/29/2016 1817   CREATININE 0.83 09/29/2016 1817   CALCIUM 9.5 09/29/2016 1817   PROT 8.8 (H) 05/02/2016 1849   ALBUMIN 4.8 05/02/2016 1849   AST 34 05/02/2016 1849   ALT 42 05/02/2016 1849   ALKPHOS 91 05/02/2016 1849   BILITOT 0.6 05/02/2016 1849   GFRNONAA >60 09/29/2016 1817   GFRAA >60 09/29/2016 1817   Lab Results  Component Value Date   CHOL 185 04/13/2009   HDL 41.90 04/13/2009   LDLCALC 121 (H) 04/13/2009   TRIG 111.0 04/13/2009   CHOLHDL 4 04/13/2009       ASSESSMENT AND PLAN 51 y.o. year old female  has a past medical history of Anxiety; Cancer ([place]); Hypercholesterolemia; Overweight(278.02); Syncope and collapse; Vision abnormalities; and Vitamin D deficiency. here with:  1. Seizures  The patient has not had any additional seizures since  November. She will remain on Lamictal 100 mg twice a day. We will check drug levels today. Advised that she should not operate a motor vehicle until she is seizure-free for 6 months. She will follow-up in 6 months or sooner if needed.  I spent 15 minutes with the patient 50% of this time was spent counseling the patient on seizure triggers.    Ward Givens, MSN, NP-C 02/03/2017, 1:51 PM [facility] [address] [place], [place] [address] [phone]

## 2017-02-03 NOTE — Patient Instructions (Signed)
Continue Lamictal 100 mg twice a day Blood work today If you have any seizure events please let us know.

## 2017-02-04 NOTE — Progress Notes (Signed)
I have reviewed and agreed above plan. 

## 2017-02-05 ENCOUNTER — Telehealth: Payer: Self-pay | Admitting: *Deleted

## 2017-02-05 LAB — CBC WITH DIFFERENTIAL/PLATELET
BASOS ABS: 0 10*3/uL (ref 0.0–0.2)
Basos: 0 %
EOS (ABSOLUTE): 0.1 10*3/uL (ref 0.0–0.4)
Eos: 2 %
Hematocrit: 40.4 % (ref 34.0–46.6)
Hemoglobin: 13.4 g/dL (ref 11.1–15.9)
IMMATURE GRANS (ABS): 0 10*3/uL (ref 0.0–0.1)
IMMATURE GRANULOCYTES: 0 %
LYMPHS: 33 %
Lymphocytes Absolute: 2.3 10*3/uL (ref 0.7–3.1)
MCH: 29.3 pg (ref 26.6–33.0)
MCHC: 33.2 g/dL (ref 31.5–35.7)
MCV: 88 fL (ref 79–97)
MONOS ABS: 0.4 10*3/uL (ref 0.1–0.9)
Monocytes: 6 %
NEUTROS PCT: 59 %
Neutrophils Absolute: 4.1 10*3/uL (ref 1.4–7.0)
PLATELETS: 223 10*3/uL (ref 150–379)
RBC: 4.58 x10E6/uL (ref 3.77–5.28)
RDW: 13.5 % (ref 12.3–15.4)
WBC: 6.9 10*3/uL (ref 3.4–10.8)

## 2017-02-05 LAB — COMPREHENSIVE METABOLIC PANEL
A/G RATIO: 1.6 (ref 1.2–2.2)
ALT: 29 IU/L (ref 0–32)
AST: 20 IU/L (ref 0–40)
Albumin: 4.5 g/dL (ref 3.5–5.5)
Alkaline Phosphatase: 110 IU/L (ref 39–117)
BUN/Creatinine Ratio: 21 (ref 9–23)
BUN: 13 mg/dL (ref 6–24)
Bilirubin Total: 0.2 mg/dL (ref 0.0–1.2)
CALCIUM: 9.4 mg/dL (ref 8.7–10.2)
CHLORIDE: 97 mmol/L (ref 96–106)
CO2: 25 mmol/L (ref 18–29)
Creatinine, Ser: 0.62 mg/dL (ref 0.57–1.00)
GFR, EST AFRICAN AMERICAN: 122 mL/min/{1.73_m2} (ref >59)
GFR, EST NON AFRICAN AMERICAN: 106 mL/min/{1.73_m2} (ref >59)
GLUCOSE: 93 mg/dL (ref 65–99)
Globulin, Total: 2.8 g/dL (ref 1.5–4.5)
POTASSIUM: 4.8 mmol/L (ref 3.5–5.2)
Sodium: 138 mmol/L (ref 134–144)
TOTAL PROTEIN: 7.3 g/dL (ref 6.0–8.5)

## 2017-02-05 LAB — LAMOTRIGINE LEVEL: LAMOTRIGINE LVL: 2.4 ug/mL (ref 2.0–20.0)

## 2017-02-05 NOTE — Telephone Encounter (Signed)
Per Edman Circle, NP, spoke with patient and informed her that her labs are unremarkalbe. She verbalized understanding, had no questions.

## 2017-02-25 ENCOUNTER — Ambulatory Visit: Payer: 59 | Admitting: Adult Health

## 2017-08-06 ENCOUNTER — Ambulatory Visit: Payer: 59 | Admitting: Neurology

## 2017-08-07 ENCOUNTER — Encounter (HOSPITAL_COMMUNITY): Payer: Self-pay | Admitting: Internal Medicine

## 2017-08-07 ENCOUNTER — Observation Stay (HOSPITAL_COMMUNITY)
Admission: EM | Admit: 2017-08-07 | Discharge: 2017-08-08 | Disposition: A | Discharge status: Home / Self Care | Payer: 59 | Attending: Internal Medicine | Admitting: Internal Medicine

## 2017-08-07 DIAGNOSIS — R55 Syncope and collapse: Secondary | ICD-10-CM | POA: Diagnosis not present

## 2017-08-07 DIAGNOSIS — E876 Hypokalemia: Secondary | ICD-10-CM | POA: Diagnosis present

## 2017-08-07 DIAGNOSIS — E7800 Pure hypercholesterolemia, unspecified: Secondary | ICD-10-CM | POA: Insufficient documentation

## 2017-08-07 DIAGNOSIS — E785 Hyperlipidemia, unspecified: Secondary | ICD-10-CM | POA: Diagnosis not present

## 2017-08-07 DIAGNOSIS — F411 Generalized anxiety disorder: Secondary | ICD-10-CM

## 2017-08-07 DIAGNOSIS — E559 Vitamin D deficiency, unspecified: Secondary | ICD-10-CM | POA: Diagnosis not present

## 2017-08-07 DIAGNOSIS — R739 Hyperglycemia, unspecified: Secondary | ICD-10-CM

## 2017-08-07 DIAGNOSIS — F419 Anxiety disorder, unspecified: Secondary | ICD-10-CM | POA: Insufficient documentation

## 2017-08-07 DIAGNOSIS — R569 Unspecified convulsions: Secondary | ICD-10-CM | POA: Diagnosis not present

## 2017-08-07 DIAGNOSIS — Z79899 Other long term (current) drug therapy: Secondary | ICD-10-CM | POA: Diagnosis not present

## 2017-08-07 DIAGNOSIS — Z87891 Personal history of nicotine dependence: Secondary | ICD-10-CM | POA: Diagnosis not present

## 2017-08-07 HISTORY — DX: Unspecified convulsions: R56.9

## 2017-08-07 LAB — URINALYSIS, ROUTINE W REFLEX MICROSCOPIC
BILIRUBIN URINE: NEGATIVE
Glucose, UA: >=500 mg/dL — AB
KETONES UR: 20 mg/dL — AB
LEUKOCYTES UA: NEGATIVE
NITRITE: NEGATIVE
PROTEIN: 100 mg/dL — AB
SPECIFIC GRAVITY, URINE: 1.023 (ref 1.005–1.030)
pH: 5 (ref 5.0–8.0)

## 2017-08-07 LAB — CBC WITH DIFFERENTIAL/PLATELET
BASOS PCT: 0 %
Basophils Absolute: 0 10*3/uL (ref 0.0–0.1)
Eosinophils Absolute: 0 10*3/uL (ref 0.0–0.7)
Eosinophils Relative: 0 %
HEMATOCRIT: 41.6 % (ref 36.0–46.0)
Hemoglobin: 14.1 g/dL (ref 12.0–15.0)
LYMPHS PCT: 23 %
Lymphs Abs: 2.2 10*3/uL (ref 0.7–4.0)
MCH: 30.1 pg (ref 26.0–34.0)
MCHC: 33.9 g/dL (ref 30.0–36.0)
MCV: 88.7 fL (ref 78.0–100.0)
Monocytes Absolute: 0.5 10*3/uL (ref 0.1–1.0)
Monocytes Relative: 5 %
NEUTROS ABS: 6.8 10*3/uL (ref 1.7–7.7)
Neutrophils Relative %: 72 %
Platelets: 246 10*3/uL (ref 150–400)
RBC: 4.69 MIL/uL (ref 3.87–5.11)
RDW: 13.4 % (ref 11.5–15.5)
WBC: 9.6 10*3/uL (ref 4.0–10.5)

## 2017-08-07 LAB — COMPREHENSIVE METABOLIC PANEL
ALBUMIN: 4.8 g/dL (ref 3.5–5.0)
ALT: 20 U/L (ref 14–54)
ANION GAP: 19 — AB (ref 5–15)
AST: 31 U/L (ref 15–41)
Alkaline Phosphatase: 113 U/L (ref 38–126)
BUN: 13 mg/dL (ref 6–20)
CALCIUM: 9.2 mg/dL (ref 8.9–10.3)
CHLORIDE: 98 mmol/L — AB (ref 101–111)
CO2: 18 mmol/L — AB (ref 22–32)
Creatinine, Ser: 0.85 mg/dL (ref 0.44–1.00)
GFR calc Af Amer: >60 mL/min (ref >60)
GFR calc non Af Amer: >60 mL/min (ref >60)
GLUCOSE: 261 mg/dL — AB (ref 65–99)
Potassium: 2.9 mmol/L — ABNORMAL LOW (ref 3.5–5.1)
SODIUM: 135 mmol/L (ref 135–145)
Total Bilirubin: 0.7 mg/dL (ref 0.3–1.2)
Total Protein: 8.6 g/dL — ABNORMAL HIGH (ref 6.5–8.1)

## 2017-08-07 LAB — I-STAT CG4 LACTIC ACID, ED: LACTIC ACID, VENOUS: 1.48 mmol/L (ref 0.5–1.9)

## 2017-08-07 LAB — RAPID URINE DRUG SCREEN, HOSP PERFORMED
Amphetamines: NOT DETECTED
Barbiturates: NOT DETECTED
Benzodiazepines: POSITIVE — AB
COCAINE: NOT DETECTED
Opiates: NOT DETECTED
Tetrahydrocannabinol: NOT DETECTED

## 2017-08-07 LAB — BLOOD GAS, VENOUS
Acid-base deficit: 0.1 mmol/L (ref 0.0–2.0)
BICARBONATE: 23.9 mmol/L (ref 20.0–28.0)
O2 Saturation: 85.7 %
PATIENT TEMPERATURE: 98.6
PCO2 VEN: 39 mmHg — AB (ref 44.0–60.0)
PH VEN: 7.405 (ref 7.250–7.430)
PO2 VEN: 53.7 mmHg — AB (ref 32.0–45.0)

## 2017-08-07 LAB — I-STAT BETA HCG BLOOD, ED (MC, WL, AP ONLY): I-stat hCG, quantitative: <5 m[IU]/mL (ref <5)

## 2017-08-07 LAB — MAGNESIUM: MAGNESIUM: 2.3 mg/dL (ref 1.7–2.4)

## 2017-08-07 LAB — PREGNANCY, URINE: Preg Test, Ur: NEGATIVE

## 2017-08-07 MED ORDER — POTASSIUM CHLORIDE IN NACL 20-0.45 MEQ/L-% IV SOLN
INTRAVENOUS | Status: DC
  Administered 2017-08-07 – 2017-08-08 (×3): via INTRAVENOUS
  Filled 2017-08-07 (×3): qty 1000

## 2017-08-07 MED ORDER — ALPRAZOLAM 0.5 MG PO TABS
0.5000 mg | ORAL_TABLET | Freq: Once | ORAL | Status: AC
  Administered 2017-08-07: 0.5 mg via ORAL
  Filled 2017-08-07: qty 1

## 2017-08-07 MED ORDER — LORAZEPAM 2 MG/ML IJ SOLN: 1.0000 mg | Freq: Once | INTRAMUSCULAR | Status: DC | PRN

## 2017-08-07 MED ORDER — POTASSIUM CHLORIDE CRYS ER 20 MEQ PO TBCR
40.0000 meq | EXTENDED_RELEASE_TABLET | Freq: Once | ORAL | Status: AC
  Administered 2017-08-07: 40 meq via ORAL
  Filled 2017-08-07: qty 2

## 2017-08-07 MED ORDER — LORAZEPAM 2 MG/ML IJ SOLN
1.0000 mg | Freq: Once | INTRAMUSCULAR | Status: AC
  Administered 2017-08-07: 1 mg via INTRAVENOUS
  Filled 2017-08-07: qty 1

## 2017-08-07 MED ORDER — SODIUM CHLORIDE 0.9 % IV BOLUS (SEPSIS)
1000.0000 mL | Freq: Once | INTRAVENOUS | Status: AC
  Administered 2017-08-07: 1000 mL via INTRAVENOUS
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
## 2017-08-07 NOTE — ED Provider Notes (Signed)
Medical screening examination/treatment/procedure(s) were conducted as a shared visit with non-physician practitioner(s) and myself.  I personally evaluated the patient during the encounter. Briefly, the patient is a 51 y.o. female with a history of seizures on Lamictal, anxiety on Xanax who presents to the emergency department after seizure. Concern for possible benzo withdrawal as she last had 3 days ago. However labs also concerning for new onset diabetes with DKA. Admitted to hospitalist for further workup and management..    EKG Interpretation None           Dalary Hollar, Grayce Sessions, MD 08/07/17 2339
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
## 2017-08-08 ENCOUNTER — Encounter (HOSPITAL_COMMUNITY): Payer: Self-pay

## 2017-08-08 DIAGNOSIS — R739 Hyperglycemia, unspecified: Secondary | ICD-10-CM | POA: Diagnosis not present

## 2017-08-08 DIAGNOSIS — E784 Other hyperlipidemia: Secondary | ICD-10-CM

## 2017-08-08 DIAGNOSIS — E876 Hypokalemia: Secondary | ICD-10-CM | POA: Diagnosis not present

## 2017-08-08 DIAGNOSIS — R569 Unspecified convulsions: Secondary | ICD-10-CM | POA: Diagnosis not present

## 2017-08-08 LAB — BASIC METABOLIC PANEL
Anion gap: 10 (ref 5–15)
BUN: 11 mg/dL (ref 6–20)
CALCIUM: 8.5 mg/dL — AB (ref 8.9–10.3)
CO2: 24 mmol/L (ref 22–32)
Chloride: 105 mmol/L (ref 101–111)
Creatinine, Ser: 0.76 mg/dL (ref 0.44–1.00)
GFR calc Af Amer: >60 mL/min (ref >60)
GLUCOSE: 158 mg/dL — AB (ref 65–99)
Potassium: 3 mmol/L — ABNORMAL LOW (ref 3.5–5.1)
Sodium: 139 mmol/L (ref 135–145)

## 2017-08-08 LAB — HEMOGLOBIN A1C
Hgb A1c MFr Bld: 5.4 % (ref 4.8–5.6)
Mean Plasma Glucose: 108.28 mg/dL

## 2017-08-08 LAB — HIV ANTIBODY (ROUTINE TESTING W REFLEX): HIV SCREEN 4TH GENERATION: NONREACTIVE

## 2017-08-08 LAB — GLUCOSE, CAPILLARY: Glucose-Capillary: 99 mg/dL (ref 65–99)

## 2017-08-08 MED ORDER — LORAZEPAM 2 MG/ML IJ SOLN: 1.0000 mg | INTRAMUSCULAR | Status: DC | PRN

## 2017-08-08 MED ORDER — HEPARIN SODIUM (PORCINE) 5000 UNIT/ML IJ SOLN
5000.0000 [IU] | Freq: Three times a day (TID) | INTRAMUSCULAR | Status: DC
  Administered 2017-08-08 (×2): 5000 [IU] via SUBCUTANEOUS
  Filled 2017-08-08 (×2): qty 1

## 2017-08-08 MED ORDER — ALPRAZOLAM 0.25 MG PO TABS: 0.2500 mg | ORAL_TABLET | Freq: Two times a day (BID) | ORAL | 0 refills | Status: DC | PRN

## 2017-08-08 MED ORDER — POTASSIUM CHLORIDE CRYS ER 20 MEQ PO TBCR
40.0000 meq | EXTENDED_RELEASE_TABLET | Freq: Once | ORAL | Status: AC
  Administered 2017-08-08: 40 meq via ORAL
  Filled 2017-08-08: qty 2

## 2017-08-08 MED ORDER — ALPRAZOLAM 0.25 MG PO TABS
0.2500 mg | ORAL_TABLET | Freq: Two times a day (BID) | ORAL | Status: DC | PRN
  Administered 2017-08-08 (×2): 0.5 mg via ORAL
  Filled 2017-08-08 (×2): qty 2

## 2017-08-08 MED ORDER — LAMOTRIGINE 100 MG PO TABS
100.0000 mg | ORAL_TABLET | Freq: Two times a day (BID) | ORAL | Status: DC
  Administered 2017-08-08: 100 mg via ORAL
  Filled 2017-08-08: qty 1

## 2017-08-08 NOTE — Discharge Instructions (Signed)
Do not drive until cleared by your Neurologist

## 2017-08-08 NOTE — ED Notes (Signed)
WILL TRANSPORT PT TO 4W 1409-1. AAOX4. PT IN NO APPARENT DISTRESS OR PAIN. IVF INFUSING W/O PAIN OR SWELLING. THE OPPORTUNITY TO ASK QUESTIONS WAS PROVIDED.
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
## 2017-08-08 NOTE — Progress Notes (Signed)
D/C instructions reviewed w/ pt. Pt verbalizes understanding and all questions answered. Pt in possession of d/c instructions, scripts, and all personal belongings. Pt d/c in stable condition in w/c by NT to friend's car.

## 2017-08-10 LAB — LAMOTRIGINE LEVEL: LAMOTRIGINE LVL: 2.3 ug/mL (ref 2.0–20.0)

## 2017-08-19 ENCOUNTER — Ambulatory Visit: Payer: Self-pay | Admitting: Neurology

## 2017-09-04 ENCOUNTER — Other Ambulatory Visit: Payer: Self-pay | Admitting: Neurology

## 2017-09-08 ENCOUNTER — Telehealth: Payer: Self-pay | Admitting: Neurology
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
## 2017-09-08 NOTE — Telephone Encounter (Signed)
Spoke to patient - states she is going to discuss her transportation issues with her HR department to see if they will work with her on the days she is unable to obtain a ride.  She has a pending appt on 10/15/17 and would like to be seen sooner.  I offered her several appt times but she will need to check with her parents and call back to reschedule.  She can be scheduled with Dr. Krista Blue or Jinny Blossom.

## 2017-10-15 ENCOUNTER — Ambulatory Visit: Payer: Self-pay | Admitting: Neurology

## 2018-01-12 ENCOUNTER — Ambulatory Visit: Payer: Self-pay | Admitting: Neurology

## 2019-07-03 ENCOUNTER — Emergency Department (HOSPITAL_COMMUNITY)
Admission: EM | Admit: 2019-07-03 | Discharge: 2019-07-09 | Disposition: A | Discharge status: Other Acute Inpatient Hospital | Payer: Self-pay | Attending: Emergency Medicine | Admitting: Emergency Medicine

## 2019-07-03 ENCOUNTER — Emergency Department (HOSPITAL_COMMUNITY): Payer: Self-pay

## 2019-07-03 ENCOUNTER — Other Ambulatory Visit: Payer: Self-pay

## 2019-07-03 ENCOUNTER — Encounter (HOSPITAL_COMMUNITY): Payer: Self-pay | Admitting: Emergency Medicine

## 2019-07-03 DIAGNOSIS — Z86018 Personal history of other benign neoplasm: Secondary | ICD-10-CM | POA: Insufficient documentation

## 2019-07-03 DIAGNOSIS — Z046 Encounter for general psychiatric examination, requested by authority: Secondary | ICD-10-CM | POA: Insufficient documentation

## 2019-07-03 DIAGNOSIS — R4182 Altered mental status, unspecified: Secondary | ICD-10-CM | POA: Insufficient documentation

## 2019-07-03 DIAGNOSIS — F23 Brief psychotic disorder: Secondary | ICD-10-CM | POA: Insufficient documentation

## 2019-07-03 DIAGNOSIS — Z20828 Contact with and (suspected) exposure to other viral communicable diseases: Secondary | ICD-10-CM | POA: Insufficient documentation

## 2019-07-03 DIAGNOSIS — Z79899 Other long term (current) drug therapy: Secondary | ICD-10-CM | POA: Insufficient documentation

## 2019-07-03 DIAGNOSIS — Z87891 Personal history of nicotine dependence: Secondary | ICD-10-CM | POA: Insufficient documentation

## 2019-07-03 LAB — COMPREHENSIVE METABOLIC PANEL
ALT: 20 U/L (ref 0–44)
AST: 19 U/L (ref 15–41)
Albumin: 4.6 g/dL (ref 3.5–5.0)
Alkaline Phosphatase: 78 U/L (ref 38–126)
Anion gap: 10 (ref 5–15)
BUN: 6 mg/dL (ref 6–20)
CO2: 26 mmol/L (ref 22–32)
Calcium: 9.6 mg/dL (ref 8.9–10.3)
Chloride: 102 mmol/L (ref 98–111)
Creatinine, Ser: 0.72 mg/dL (ref 0.44–1.00)
GFR calc Af Amer: >60 mL/min (ref >60)
GFR calc non Af Amer: >60 mL/min (ref >60)
Glucose, Bld: 148 mg/dL — ABNORMAL HIGH (ref 70–99)
Potassium: 3.7 mmol/L (ref 3.5–5.1)
Sodium: 138 mmol/L (ref 135–145)
Total Bilirubin: 0.6 mg/dL (ref 0.3–1.2)
Total Protein: 8.3 g/dL — ABNORMAL HIGH (ref 6.5–8.1)

## 2019-07-03 LAB — CBC WITH DIFFERENTIAL/PLATELET
Abs Immature Granulocytes: 0.03 10*3/uL (ref 0.00–0.07)
Basophils Absolute: 0 10*3/uL (ref 0.0–0.1)
Basophils Relative: 0 %
Eosinophils Absolute: 0 10*3/uL (ref 0.0–0.5)
Eosinophils Relative: 0 %
HCT: 42.8 % (ref 36.0–46.0)
Hemoglobin: 14.6 g/dL (ref 12.0–15.0)
Immature Granulocytes: 0 %
Lymphocytes Relative: 16 %
Lymphs Abs: 1.7 10*3/uL (ref 0.7–4.0)
MCH: 30.2 pg (ref 26.0–34.0)
MCHC: 34.1 g/dL (ref 30.0–36.0)
MCV: 88.4 fL (ref 80.0–100.0)
Monocytes Absolute: 0.6 10*3/uL (ref 0.1–1.0)
Monocytes Relative: 6 %
Neutro Abs: 8.3 10*3/uL — ABNORMAL HIGH (ref 1.7–7.7)
Neutrophils Relative %: 78 %
Platelets: 293 10*3/uL (ref 150–400)
RBC: 4.84 MIL/uL (ref 3.87–5.11)
RDW: 12.5 % (ref 11.5–15.5)
WBC: 10.6 10*3/uL — ABNORMAL HIGH (ref 4.0–10.5)
nRBC: 0 % (ref 0.0–0.2)

## 2019-07-03 LAB — ETHANOL: Alcohol, Ethyl (B): <10 mg/dL (ref <10)

## 2019-07-03 LAB — URINALYSIS, ROUTINE W REFLEX MICROSCOPIC
Bilirubin Urine: NEGATIVE
Glucose, UA: >=500 mg/dL — AB
Hgb urine dipstick: NEGATIVE
Ketones, ur: NEGATIVE mg/dL
Leukocytes,Ua: NEGATIVE
Nitrite: NEGATIVE
Protein, ur: NEGATIVE mg/dL
Specific Gravity, Urine: 1.01 (ref 1.005–1.030)
pH: 5 (ref 5.0–8.0)

## 2019-07-03 LAB — ACETAMINOPHEN LEVEL: Acetaminophen (Tylenol), Serum: <10 ug/mL — ABNORMAL LOW (ref 10–30)

## 2019-07-03 LAB — SALICYLATE LEVEL: Salicylate Lvl: <7 mg/dL (ref 2.8–30.0)

## 2019-07-03 LAB — RAPID URINE DRUG SCREEN, HOSP PERFORMED
Amphetamines: NOT DETECTED
Barbiturates: NOT DETECTED
Benzodiazepines: NOT DETECTED
Cocaine: NOT DETECTED
Opiates: NOT DETECTED
Tetrahydrocannabinol: NOT DETECTED

## 2019-07-03 MED ORDER — LORAZEPAM 1 MG PO TABS
1.0000 mg | ORAL_TABLET | Freq: Once | ORAL | Status: AC
  Administered 2019-07-03: 1 mg via ORAL
  Filled 2019-07-03: qty 1

## 2019-07-03 MED ORDER — ZIPRASIDONE MESYLATE 20 MG IM SOLR
20.0000 mg | Freq: Once | INTRAMUSCULAR | Status: AC
  Administered 2019-07-03: 20 mg via INTRAMUSCULAR

## 2019-07-03 MED ORDER — LORAZEPAM 1 MG PO TABS
1.0000 mg | ORAL_TABLET | Freq: Once | ORAL | Status: AC
  Administered 2019-07-04: 01:00:00 1 mg via ORAL
  Filled 2019-07-03: qty 1

## 2019-07-03 MED ORDER — STERILE WATER FOR INJECTION IJ SOLN
INTRAMUSCULAR | Status: AC
  Administered 2019-07-03: 07:00:00 10 mL via INTRAMUSCULAR
  Filled 2019-07-03: qty 10

## 2019-07-03 MED ORDER — ZIPRASIDONE MESYLATE 20 MG IM SOLR
INTRAMUSCULAR | Status: AC
  Administered 2019-07-03: 20 mg via INTRAMUSCULAR
  Filled 2019-07-03: qty 20

## 2019-07-03 MED ORDER — LORAZEPAM 2 MG/ML IJ SOLN
2.0000 mg | Freq: Once | INTRAMUSCULAR | Status: AC
  Administered 2019-07-03: 2 mg via INTRAMUSCULAR
  Filled 2019-07-03: qty 1

## 2019-07-03 MED ORDER — STERILE WATER FOR INJECTION IJ SOLN
10.0000 mL | Freq: Once | INTRAMUSCULAR | Status: AC
  Administered 2019-07-03: 07:00:00 10 mL via INTRAMUSCULAR

## 2019-07-03 MED ORDER — LAMOTRIGINE 25 MG PO TABS
100.0000 mg | ORAL_TABLET | Freq: Two times a day (BID) | ORAL | Status: DC
  Administered 2019-07-03 – 2019-07-08 (×12): 100 mg via ORAL
  Filled 2019-07-03 (×13): qty 4
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
## 2019-07-03 NOTE — ED Provider Notes (Signed)
Patient was given Ativan and Geodon to calm her down.  She was able to get her blood work and her CT scan done.  She also had evaluation by psychiatry.  I examined the patient and she was moderately lethargic but knew who she was where she was and was able to move all extremities without problems.  She did not state whether she was suicidal or homicidal.  Patient is medically cleared and we are awaiting psychiatric recommendations   Milton Ferguson, MD 07/03/19 1133

## 2019-07-03 NOTE — BH Assessment (Signed)
Tele Assessment Note   Patient Name: Misty Howell MRN: [id number] Referring Physician: Vevelyn Francois, MD Location of Patient: [facility] Location of Provider: [place] Department  Misty Howell is a 53 y.o. female who presented to [facility] as a voluntary walk-in (transported by mother) with altered mental status.  She is now under IVC (petitioner is EDP).  Pt has not been assessed by TTS before.  She lives in [place] with her mother and two sons.  Pt is not followed by an outpatient psych provider.  Pt works in Engineer, mining.  Per hospital report, "Patient dropped off by her mother.  Patient is agitated and difficult to redirect.  She is rambling and paranoid.  She has rapid and pressured speech.  She is oriented to person and place.  States she is here because she needs help with anxiety and to speak with a therapist.  She states she is here for anxiety reasons and "personal problems" which she refuses to talk about.  She states she needs an ambulance to take her to [facility] in [place].  She is states she needs to talk to her lawyer and she states that she wants to go to a real hospital in [place].  States she has "evidence" in her laundry basket of property that she brought with her.  States she took the items from her safe deposit box so her family would not get them. She denies any alcohol or drug use.  She states she has a history of seizures. States she is not suicidal or homicidal and states she is not hearing voices."  Chief Strategy Officer spoke with Pt.  Pt was calmer and appeared sedated.  She felt asleep several times during assessment.  When asked why she was at the hospital, Pt seemed guarded.  She reported that ''people'' are ''going through my stuff, taking away my books and my evidence.''  Pt denied suicidal ideation, homicidal ideation, hallucination, and self-injurious behavior.  Pt stated to Pryor Curia that she is not employed and that her children live apart from her.  She said she wanted  to speak with her children and leave the hospital.  Author also spoke with Pt's mother.  Pt's mother stated that Pt was agitated last night, was locking her door, and complaining that ''people'' were hacking her phone and her accounts.  Per mother, Pt works for [employer] and lives with her along with Pt's two teenage sons.  Pt's mother stated that Pt has never acted like this before.     UDS and BAC were not available at time of assessment.   During assessment, Pt presented as drowsy.  She was oriented x4.  Pt's mood and affect were ambivalent.  Pt's speech was soft and had moments of word salad.  Pt's thought processes suggested flight of ideas.  Memory could not be assessed.  Concentration was poor.  Insight and judgment were poor.  Impulse control was fair.  Consulted with Myrle Sheng, FNP, who determined that Pt meets inpatient criteria.  Diagnosis: Brief Psychotic Disorder  Past Medical History:  Past Medical History:  Diagnosis Date  . Anxiety   . Cancer ([place])    Giant cell tumor left fifth finger.  . Hypercholesterolemia   . Overweight(278.02)   . Seizures ([place])   . Syncope and collapse   . Vision abnormalities   . Vitamin D deficiency     Past Surgical History:  Procedure Laterality Date  . BACK SURGERY    . CESAREAN SECTION  Family History:  Family History  Problem Relation Age of Onset  . Hypertension Mother   . Migraines Mother   . Hypertension Father     Social History:  reports that she quit smoking about 30 years ago. Her smoking use included cigarettes. She smoked 0.00 packs per day for 3.00 years. She has never used smokeless tobacco. She reports current alcohol use. She reports that she does not use drugs.  Additional Social History:  Alcohol / Drug Use Pain Medications: See MAR Prescriptions: See MAR Over the Counter: See MAR History of alcohol / drug use?: No history of alcohol / drug abuse  CIWA:   COWS:    Allergies: No Known  Allergies  Home Medications: (Not in a hospital admission)   OB/GYN Status:  No LMP recorded.  General Assessment Data [facility] of Assessment: [facility] TTS Assessment: In system Is this a Tele or Face-to-Face Assessment?: Tele Assessment Is this an Initial Assessment or a Re-assessment for this encounter?: Initial Assessment Patient Accompanied by:: N/A Language Other than English: No Living Arrangements: Other (Comment) What gender do you identify as?: Female Marital status: Separated Pregnancy Status: No Living Arrangements: Parent, Children Can pt return to current living arrangement?: Yes Admission Status: Involuntary Petitioner: ED Attending Is patient capable of signing voluntary admission?: Yes Referral Source: Self/Family/Friend Insurance type: None     Crisis Care Plan Living Arrangements: Parent, Children Name of Psychiatrist: None currently Name of Therapist: None currently  Education Status Is patient currently in school?: No Is the patient employed, unemployed or receiving disability?: Employed  Risk to self with the past 6 months Suicidal Ideation: No Has patient been a risk to self within the past 6 months prior to admission? : No Suicidal Intent: No Has patient had any suicidal intent within the past 6 months prior to admission? : No Is patient at risk for suicide?: No Suicidal Plan?: No Has patient had any suicidal plan within the past 6 months prior to admission? : No Access to Means: No What has been your use of drugs/alcohol within the last 12 months?: Denied drug use; social use of alcohol Previous Attempts/Gestures: No Intentional Self Injurious Behavior: None Family Suicide History: Unknown Recent stressful life event(s): Other (Comment)(Apparent paranoia) Persecutory voices/beliefs?: No Depression: No Depression Symptoms: Feeling angry/irritable, Insomnia Substance abuse history and/or treatment for substance abuse?: No Suicide prevention  information given to non-admitted patients: Not applicable  Risk to Others within the past 6 months Homicidal Ideation: No Does patient have any lifetime risk of violence toward others beyond the six months prior to admission? : No Thoughts of Harm to Others: No Current Homicidal Intent: No Current Homicidal Plan: No Access to Homicidal Means: No History of harm to others?: No Assessment of Violence: None Noted Does patient have access to weapons?: No Is patient on probation?: Unknown  Psychosis Hallucinations: None noted(Pt denied -- see notes) Delusions: Persecutory  Mental Status Report Appearance/Hygiene: Unremarkable, In scrubs Eye Contact: Poor Motor Activity: Freedom of movement, Unremarkable Speech: Word salad, Soft Level of Consciousness: Drowsy Mood: Ambivalent Affect: Appropriate to circumstance Anxiety Level: Severe Thought Processes: Flight of Ideas Judgement: Impaired Orientation: Person, Time, Place, Situation Obsessive Compulsive Thoughts/Behaviors: None  Cognitive Functioning Concentration: Poor Memory: Unable to Assess Is patient IDD: No Insight: Poor Impulse Control: Fair Sleep: Decreased Total Hours of Sleep: 4 Vegetative Symptoms: Staying in bed  ADLScreening ([place]) Patient's cognitive ability adequate to safely complete daily activities?: Yes Patient able to express need for assistance  with ADLs?: Yes Independently performs ADLs?: Yes (appropriate for developmental age)  Prior Inpatient Therapy Prior Inpatient Therapy: No  Prior Outpatient Therapy Prior Outpatient Therapy: Yes Prior Therapy Dates: Could not recall Prior Therapy Facilty/Provider(s): Could not recall Reason for Treatment: Anxiety ''someone to talk to'' Does patient have an ACCT team?: No Does patient have Intensive In-House Services?  : No Does patient have [facility] services? : No Does patient have P4CC services?: No  ADL Screening (condition at time of  admission) Patient's cognitive ability adequate to safely complete daily activities?: Yes Is the patient deaf or have difficulty hearing?: No Does the patient have difficulty seeing, even when wearing glasses/contacts?: No Does the patient have difficulty concentrating, remembering, or making decisions?: No Patient able to express need for assistance with ADLs?: Yes Does the patient have difficulty dressing or bathing?: No Independently performs ADLs?: Yes (appropriate for developmental age) Does the patient have difficulty walking or climbing stairs?: No Weakness of Legs: None Weakness of Arms/Hands: None  Home Assistive Devices/Equipment Home Assistive Devices/Equipment: None  Therapy Consults (therapy consults require a physician order) PT Evaluation Needed: No OT Evalulation Needed: No SLP Evaluation Needed: No Abuse/Neglect Assessment (Assessment to be complete while patient is alone) Abuse/Neglect Assessment Can Be Completed: Yes Physical Abuse: Denies Verbal Abuse: Denies Sexual Abuse: Denies Exploitation of patient/patient's resources: Denies Self-Neglect: Denies Values / Beliefs Cultural Requests During Hospitalization: None Spiritual Requests During Hospitalization: None Consults Spiritual Care Consult Needed: No Social Work Consult Needed: No            Disposition:  Disposition Initial Assessment Completed for this Encounter: Yes Disposition of Patient: Admit Type of inpatient treatment program: Adult(Per T. Bobby Rumpf, FNP, Pt meets inpt criteria)  This service was provided via telemedicine using a 2-way, interactive audio and video technology.  Names of all persons participating in this telemedicine service and their role in this encounter. Name: Tyanna, Weeda Role: Pt  Name: Chapman Fitch Role: Pt's mother          Marlowe Aschoff 07/03/2019 11:25 AM

## 2019-07-03 NOTE — ED Notes (Addendum)
Pt sitting up in bed. Pt has removed her armband and states you are not going to get this and sits on armband. Unable to redirect
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
## 2019-07-03 NOTE — Progress Notes (Signed)
Patient meets criteria for inpatient treatment. CSW faxed referrals to the following facilities for review:  [facility]  [facility]  [facility]  [facility]  [facility]  [facility]   TSS will continue to seek bed placement.    Ardelle Anton, MSW, LCSW Clinical Social Worker II/Disposition [facility]  Phone: [phone] Fax: [phone]

## 2019-07-03 NOTE — ED Notes (Signed)
Patient given phone to call her family.

## 2019-07-03 NOTE — ED Notes (Signed)
Pt continues to ramble and talking about [employer] and how we are not taking her "evidence".

## 2019-07-03 NOTE — ED Notes (Signed)
Pt rambling in room . Brushing teeth multiple times. Trying to place washcloths in toilet. Restless in and out of bed

## 2019-07-03 NOTE — ED Notes (Signed)
security and rpd in room. Pt agitated and unable to redirect .

## 2019-07-03 NOTE — ED Notes (Signed)
Patient is resting comfortably. 

## 2019-07-03 NOTE — ED Notes (Signed)
Pt. Refused EKG at this time.

## 2019-07-03 NOTE — ED Notes (Addendum)
Signed off for officer to leave. Pt is calm and cooperative

## 2019-07-03 NOTE — ED Notes (Signed)
Pt served IVC papers
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
## 2019-07-04 LAB — POC URINE PREG, ED: Preg Test, Ur: NEGATIVE

## 2019-07-04 MED ORDER — LORAZEPAM 1 MG PO TABS
1.0000 mg | ORAL_TABLET | Freq: Once | ORAL | Status: AC
  Administered 2019-07-04: 1 mg via ORAL
  Filled 2019-07-04: qty 1

## 2019-07-04 NOTE — Progress Notes (Signed)
Patient continues to meet criteria for inpatient treatment. CSW faxed referrals to the following facilities for review:  [facility]  [place]  [facility]  [facility]  [facility]  [facility]  [facility]  [facility] - no beds today, at [place] -no beds today, at [place]   No appropriate beds at [place] today.  Stephanie Acre, LCSW-A Clinical Social Worker
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
## 2019-07-05 MED ORDER — OLANZAPINE 5 MG PO TABS
2.5000 mg | ORAL_TABLET | Freq: Once | ORAL | Status: AC
  Administered 2019-07-05: 17:00:00 2.5 mg via ORAL
  Filled 2019-07-05: qty 1

## 2019-07-05 MED ORDER — ONDANSETRON 4 MG PO TBDP
4.0000 mg | ORAL_TABLET | Freq: Once | ORAL | Status: AC
  Administered 2019-07-05: 04:00:00 4 mg via ORAL
  Filled 2019-07-05: qty 1
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
## 2019-07-05 NOTE — ED Notes (Signed)
Pt asking this nurse if she is going to get her seizure medication. Notified patient she has gotten her dose for the day. Pt continues to ramble and state that she does not have insurance and has been unable to get a breast exam or see her neurologist. Pt stating that she also needs to see a dentist because my mouth feels "bad" and I do not have insurance to pay for one.

## 2019-07-05 NOTE — ED Notes (Signed)
Pt given new tooth brush and ice chips per pt request.

## 2019-07-06 MED ORDER — ZOLPIDEM TARTRATE 5 MG PO TABS
10.0000 mg | ORAL_TABLET | Freq: Once | ORAL | Status: AC
  Administered 2019-07-06: 22:00:00 10 mg via ORAL
  Filled 2019-07-06: qty 2

## 2019-07-06 MED ORDER — ZOLPIDEM TARTRATE 5 MG PO TABS
5.0000 mg | ORAL_TABLET | Freq: Once | ORAL | Status: AC
  Administered 2019-07-06: 5 mg via ORAL
  Filled 2019-07-06: qty 1

## 2019-07-06 NOTE — ED Notes (Signed)
Pt given water per request

## 2019-07-06 NOTE — ED Notes (Signed)
Pt given dinner tray.

## 2019-07-06 NOTE — ED Notes (Signed)
Pt's mother here visiting

## 2019-07-06 NOTE — ED Notes (Signed)
Pt given toothbrush and toothpaste per request

## 2019-07-06 NOTE — ED Notes (Signed)
Pt allowed to use telephone

## 2019-07-06 NOTE — ED Notes (Signed)
Pt ambulated to restroom. 

## 2019-07-06 NOTE — ED Notes (Signed)
Pt using 3rd phone call for today.

## 2019-07-06 NOTE — ED Notes (Signed)
Pt put head in sink and wet hair, pt offered a shower after lunch due to bathroom being worked on.

## 2019-07-06 NOTE — ED Notes (Signed)
Pt taken to shower. Scrubs and bed linen changed.

## 2019-07-06 NOTE — ED Notes (Signed)
Pt talking to mother on phone

## 2019-07-06 NOTE — ED Notes (Signed)
Pt given apple juice per request.

## 2019-07-06 NOTE — ED Notes (Signed)
Pt requesting something for sleep, provider notified and orders received

## 2019-07-06 NOTE — ED Notes (Signed)
Pt requesting to call husband, so that he can have her bank account frozen due to many people having access to the account

## 2019-07-06 NOTE — ED Notes (Signed)
Pt's employer calls requesting update on pt. States they were told by pt's mother that is was okay to call and ask. Spoke with pt with sitter present to see if she wished for me to speak with them. Pt states to tell them "I am feeling better" , this relayed to pt's job in those words

## 2019-07-06 NOTE — ED Notes (Signed)
Pt given breakfast tray

## 2019-07-07 MED ORDER — STERILE WATER FOR INJECTION IJ SOLN
INTRAMUSCULAR | Status: AC
  Filled 2019-07-07: qty 10

## 2019-07-07 MED ORDER — LORAZEPAM 1 MG PO TABS
1.0000 mg | ORAL_TABLET | Freq: Four times a day (QID) | ORAL | Status: DC | PRN
  Administered 2019-07-07 – 2019-07-08 (×4): 1 mg via ORAL
  Filled 2019-07-07 (×4): qty 1

## 2019-07-07 MED ORDER — ZIPRASIDONE MESYLATE 20 MG IM SOLR
20.0000 mg | Freq: Once | INTRAMUSCULAR | Status: AC
  Administered 2019-07-07: 22:00:00 20 mg via INTRAMUSCULAR
  Filled 2019-07-07: qty 20

## 2019-07-07 NOTE — ED Notes (Addendum)
Pt is wanting to walk around the unit she is refusing to stay in her room pt is getting worked up she thinks her father is outside of her room. We can not get her reoriented. Security at bedside at this time.

## 2019-07-07 NOTE — ED Notes (Signed)
Pt to restroom to brush teeth

## 2019-07-07 NOTE — ED Notes (Signed)
Pt in room crumbling crackers and writing notes with the crumbs. Advised that is not the appropriate behavior

## 2019-07-07 NOTE — BHH Counselor (Signed)
TTS reassessment: Patient is alert and oriented to self and place, but does not appear to be otherwise oriented. Patient's thoughts are disorganized. When asked what brought her to the hospital she states "A friend was worried about me and I had a cold. I'm a single mom. I worry about bills." Patient denies SI/HI/AVH. Patient makes disconnected statements such as "I want to lose weight. I'm embarrassed to go to the bathroom. I miss Raymond. I think about Lady Gary a lot." Patient continues to meet in patient criteria.

## 2019-07-08 LAB — SARS CORONAVIRUS 2 BY RT PCR (HOSPITAL ORDER, PERFORMED IN ~~LOC~~ HOSPITAL LAB): SARS Coronavirus 2: NEGATIVE

## 2019-07-08 MED ORDER — LORAZEPAM 2 MG/ML IJ SOLN
2.0000 mg | Freq: Once | INTRAMUSCULAR | Status: AC
  Administered 2019-07-08: 2 mg via INTRAVENOUS
  Filled 2019-07-08: qty 1

## 2019-07-08 MED ORDER — OLANZAPINE 5 MG PO TBDP
10.0000 mg | ORAL_TABLET | Freq: Every day | ORAL | Status: DC
  Administered 2019-07-08: 09:00:00 10 mg via ORAL
  Filled 2019-07-08 (×2): qty 2

## 2019-07-08 MED ORDER — OLANZAPINE 5 MG PO TBDP
10.0000 mg | ORAL_TABLET | ORAL | Status: AC
  Filled 2019-07-08: qty 2

## 2019-07-08 MED ORDER — HALOPERIDOL LACTATE 5 MG/ML IJ SOLN
5.0000 mg | Freq: Once | INTRAMUSCULAR | Status: AC
  Administered 2019-07-08: 5 mg via INTRAMUSCULAR
  Filled 2019-07-08: qty 1

## 2019-07-08 NOTE — ED Notes (Signed)
Pt keeps walking out of room, redirecting to go back inside of room. Unable to redirect pt. Pt having flight of ideas and states "please help me, please call dr.[email or web address] for me, tell him I need help. Please call mark boler for me and tell him to get all of the kids and move them into a safe place. Pt states whats your favorite [place]? Do you have a favorite chef? Can you please help me? im scared, there are cameras everywhere watching me." this nurse obtained order for zyprexa. Pt refused to take medication. Pt crushed pill in her fingers and put on bedside table. Pt then crushed cup with ice water. This nurse got more zyprexa and put in gingerale but pt then refused to drink. MD notified. New order placed for medication.

## 2019-07-08 NOTE — ED Notes (Signed)
Pt was given breakfast tray 

## 2019-07-08 NOTE — ED Notes (Signed)
Pt remains in her bed at this time.

## 2019-07-08 NOTE — ED Notes (Signed)
Patient conversation is all over the place rambling. Patient talking about family and her health. Patient concerned if she has Covid. Reassured patient that she does not. Patient sitting in chair at door in room.

## 2019-07-08 NOTE — ED Provider Notes (Signed)
Getting more and more agitated.  She has been up all night, wandering in her room.  She has been trying to wander in the department as well.  Despite being here for 117 hours, behavioral health has not added any medications to help with this.  Will initiate Zyprexa.   Orpah Greek, MD 07/08/19 [phone]

## 2019-07-08 NOTE — ED Notes (Signed)
Pt sitter states pt threw stretcher mattress into floor and started banging on the cabinets inside the room. Medication ordered from MD.  Going to give medication to pt, pt states "I want to see a lawyer, or someone to give me legal advice. Im sorry I dont want my son to know about all of this because he is out to get me. I can hear him up there. Can you get my attorney to come speak with me." this nurse keeps trying to redirect pt. Pt then then states "what is in the medication your giving me? Will it show up on my drug screen? Can a pharmacy tech come talk to me?" dana came to bedside to talk to pt.

## 2019-07-08 NOTE — BHH Counselor (Signed)
Pt reported sleeping and eating well. Pt was very tangential in thought processes and speech. Pt continues to meet inpatient criteria.

## 2019-07-08 NOTE — ED Notes (Signed)
Pt continues attempting to come out of room; pt verbalizing disordered thought process, flight of ideas, disconnected rambling from subject to subject. Pt informed at this time if she does not stay in bed, we will have to use restraints. Pt then says, "No, No please, I'll stay in the bed, I promise, I'll just watch TV." [place], present.

## 2019-07-08 NOTE — Progress Notes (Signed)
Pt continues to meet inpatient criteria. CSW RE-FAXED referral information to the following hospitals for review:   [facility], [facility], [facility], [place], [place], [facility] [place], [place], [place], [place]  Disposition will continue to follow.   Audree Camel, LCSW, [place] Disposition [facility] BHH/TTS [phone]

## 2019-07-08 NOTE — ED Notes (Signed)
Patient states that she wants to speak with real nurses and doctors. States "I know this is a setup. They keep sticking me every time I go to sleep. They keep placing things in my room. Someone is trying to kick me". Pt asked to speak with a lawyer and her family. Told pt that she could speak with family after she calms down.

## 2019-07-08 NOTE — ED Notes (Signed)
Pt took shower °

## 2019-07-08 NOTE — ED Notes (Signed)
Received report on pt, sitter is at bedside, pt denies any complaints, update given,

## 2019-07-08 NOTE — ED Notes (Signed)
ED Provider at bedside. 

## 2019-07-09 NOTE — ED Provider Notes (Signed)
Patient has been accepted at [facility] in [facility]. Dr Mitzie Na, accepting physician.   When I talked to the patient around 3 AM she was walking around her room, she states she could not sleep.  She seems agreeable to being transferred.     Rolland Porter, MD 07/09/19 [phone]

## 2019-07-09 NOTE — ED Notes (Signed)
Pt has been accepted to [place] [place]  Contact number [phone] Contact person kelly Report given to [facility]  Dr Mitzie Na accepting MD.

## 2019-07-09 NOTE — ED Notes (Signed)
Pt standing at sink putting water all over the counter and her hair, pt instructed to stop and that she needs to get some rest. Pt given prn medication as ordered, sitter remains at bedside,

## 2020-09-13 ENCOUNTER — Encounter (HOSPITAL_COMMUNITY): Payer: Self-pay

## 2020-09-13 ENCOUNTER — Other Ambulatory Visit: Payer: Self-pay

## 2020-09-13 ENCOUNTER — Emergency Department (HOSPITAL_COMMUNITY): Payer: Self-pay

## 2020-09-13 ENCOUNTER — Emergency Department (HOSPITAL_COMMUNITY)
Admission: EM | Admit: 2020-09-13 | Discharge: 2020-09-14 | Disposition: A | Discharge status: Home / Self Care | Payer: Self-pay | Attending: Emergency Medicine | Admitting: Emergency Medicine

## 2020-09-13 DIAGNOSIS — Z85830 Personal history of malignant neoplasm of bone: Secondary | ICD-10-CM | POA: Insufficient documentation

## 2020-09-13 DIAGNOSIS — Z87891 Personal history of nicotine dependence: Secondary | ICD-10-CM | POA: Insufficient documentation

## 2020-09-13 DIAGNOSIS — R569 Unspecified convulsions: Secondary | ICD-10-CM | POA: Insufficient documentation

## 2020-09-13 LAB — CBC WITH DIFFERENTIAL/PLATELET
Abs Immature Granulocytes: 0.06 10*3/uL (ref 0.00–0.07)
Basophils Absolute: 0 10*3/uL (ref 0.0–0.1)
Basophils Relative: 0 %
Eosinophils Absolute: 0 10*3/uL (ref 0.0–0.5)
Eosinophils Relative: 0 %
HCT: 48.2 % — ABNORMAL HIGH (ref 36.0–46.0)
Hemoglobin: 15.5 g/dL — ABNORMAL HIGH (ref 12.0–15.0)
Immature Granulocytes: 1 %
Lymphocytes Relative: 13 %
Lymphs Abs: 1.4 10*3/uL (ref 0.7–4.0)
MCH: 29.2 pg (ref 26.0–34.0)
MCHC: 32.2 g/dL (ref 30.0–36.0)
MCV: 90.8 fL (ref 80.0–100.0)
Monocytes Absolute: 0.6 10*3/uL (ref 0.1–1.0)
Monocytes Relative: 6 %
Neutro Abs: 8.8 10*3/uL — ABNORMAL HIGH (ref 1.7–7.7)
Neutrophils Relative %: 80 %
Platelets: 253 10*3/uL (ref 150–400)
RBC: 5.31 MIL/uL — ABNORMAL HIGH (ref 3.87–5.11)
RDW: 12.1 % (ref 11.5–15.5)
WBC: 11 10*3/uL — ABNORMAL HIGH (ref 4.0–10.5)
nRBC: 0 % (ref 0.0–0.2)

## 2020-09-13 LAB — COMPREHENSIVE METABOLIC PANEL
ALT: 28 U/L (ref 0–44)
AST: 23 U/L (ref 15–41)
Albumin: 4.9 g/dL (ref 3.5–5.0)
Alkaline Phosphatase: 87 U/L (ref 38–126)
Anion gap: 15 (ref 5–15)
BUN: 11 mg/dL (ref 6–20)
CO2: 20 mmol/L — ABNORMAL LOW (ref 22–32)
Calcium: 9.6 mg/dL (ref 8.9–10.3)
Chloride: 99 mmol/L (ref 98–111)
Creatinine, Ser: 0.83 mg/dL (ref 0.44–1.00)
GFR, Estimated: >60 mL/min (ref >60)
Glucose, Bld: 176 mg/dL — ABNORMAL HIGH (ref 70–99)
Potassium: 3.3 mmol/L — ABNORMAL LOW (ref 3.5–5.1)
Sodium: 134 mmol/L — ABNORMAL LOW (ref 135–145)
Total Bilirubin: 0.9 mg/dL (ref 0.3–1.2)
Total Protein: 8.7 g/dL — ABNORMAL HIGH (ref 6.5–8.1)

## 2020-09-13 LAB — ETHANOL: Alcohol, Ethyl (B): <10 mg/dL (ref <10)

## 2020-09-13 MED ORDER — LEVETIRACETAM 500 MG PO TABS: 500.0000 mg | ORAL_TABLET | Freq: Two times a day (BID) | ORAL | 0 refills | Status: DC

## 2020-09-13 MED ORDER — LEVETIRACETAM IN NACL 1000 MG/100ML IV SOLN
1000.0000 mg | Freq: Once | INTRAVENOUS | Status: AC
  Administered 2020-09-13: 1000 mg via INTRAVENOUS
  Filled 2020-09-13: qty 100

## 2020-09-13 NOTE — ED Notes (Signed)
No seizures at this time

## 2020-09-13 NOTE — ED Notes (Signed)
Pt at CT

## 2020-09-13 NOTE — ED Provider Notes (Signed)
[facility] EMERGENCY DEPARTMENT Provider Note   CSN: [id number] Arrival date & time: 09/13/20  1914     History Chief Complaint  Patient presents with  . Seizures    Misty Howell is a 54 y.o. female.  Patient has a history of seizures.  Patient has seizures today.  She has not been on her medicine.  For quite some time.  The history is provided by the patient and medical records. No language interpreter was used.  Seizures Seizure activity on arrival: yes   Seizure type:  Grand mal Preceding symptoms: no sensation of an aura present   Initial focality:  None Episode characteristics: abnormal movements   Postictal symptoms: confusion   Return to baseline: yes   Severity:  Moderate Timing:  Once Progression:  Resolved Context: not alcohol withdrawal        Past Medical History:  Diagnosis Date  . Anxiety   . Cancer ([place])    Giant cell tumor left fifth finger.  . Hypercholesterolemia   . Overweight(278.02)   . Seizures ([place])   . Syncope and collapse   . Vision abnormalities   . Vitamin D deficiency     Patient Active Problem List   Diagnosis Date Noted  . Hyperglycemia 08/08/2017  . Hypokalemia 08/07/2017  . Seizure ([place]) 11/24/2013  . Collapse 11/24/2013  . Vitamin D deficiency 07/06/2010  . Hyperlipidemia 07/05/2010  . OVERWEIGHT 03/31/2008  . Anxiety state 03/30/2008    Past Surgical History:  Procedure Laterality Date  . BACK SURGERY    . CESAREAN SECTION       OB History   No obstetric history on file.     Family History  Problem Relation Age of Onset  . Hypertension Mother   . Migraines Mother   . Hypertension Father     Social History   Tobacco Use  . Smoking status: Former Smoker    Packs/day: 0.00    Years: 3.00    Pack years: 0.00    Types: Cigarettes    Quit date: 01/20/1989    Years since quitting: 31.6  . Smokeless tobacco: Never Used  Substance Use Topics  . Alcohol use: Yes    Comment: social  . Drug use: No     Home Medications Prior to Admission medications   Medication Sig Start Date End Date Taking? Authorizing Provider  levETIRAcetam (KEPPRA) 500 MG tablet Take 1 tablet (500 mg total) by mouth 2 (two) times daily. 09/13/20   Milton Ferguson, MD    Allergies    Patient has no known allergies.  Review of Systems   Review of Systems  Constitutional: Negative for appetite change and fatigue.  HENT: Negative for congestion, ear discharge and sinus pressure.   Eyes: Negative for discharge.  Respiratory: Negative for cough.   Cardiovascular: Negative for chest pain.  Gastrointestinal: Negative for abdominal pain and diarrhea.  Genitourinary: Negative for frequency and hematuria.  Musculoskeletal: Negative for back pain.  Skin: Negative for rash.  Neurological: Positive for seizures. Negative for headaches.  Psychiatric/Behavioral: Negative for hallucinations.    Physical Exam Updated Vital Signs BP (!) 127/97   Pulse (!) 106   Temp 98.1 F (36.7 C) (Oral)   Resp (!) 26   Ht 5\' 4"  (1.626 m)   Wt 82.6 kg   SpO2 98%   BMI 31.24 kg/m   Physical Exam Vitals and nursing note reviewed.  Constitutional:      Appearance: She is well-developed.  HENT:  Head: Normocephalic.     Nose: Nose normal.  Eyes:     General: No scleral icterus.    Conjunctiva/sclera: Conjunctivae normal.  Neck:     Thyroid: No thyromegaly.  Cardiovascular:     Rate and Rhythm: Normal rate and regular rhythm.     Heart sounds: No murmur heard.  No friction rub. No gallop.   Pulmonary:     Breath sounds: No stridor. No wheezing or rales.  Chest:     Chest wall: No tenderness.  Abdominal:     General: There is no distension.     Tenderness: There is no abdominal tenderness. There is no rebound.  Musculoskeletal:        General: Normal range of motion.     Cervical back: Neck supple.  Lymphadenopathy:     Cervical: No cervical adenopathy.  Skin:    Findings: No erythema or rash.  Neurological:      Mental Status: She is alert and oriented to person, place, and time.     Motor: No abnormal muscle tone.     Coordination: Coordination normal.  Psychiatric:        Behavior: Behavior normal.     ED Results / Procedures / Treatments   Labs (all labs ordered are listed, but only abnormal results are displayed) Labs Reviewed  CBC WITH DIFFERENTIAL/PLATELET - Abnormal; Notable for the following components:      Result Value   WBC 11.0 (*)    RBC 5.31 (*)    Hemoglobin 15.5 (*)    HCT 48.2 (*)    Neutro Abs 8.8 (*)    All other components within normal limits  COMPREHENSIVE METABOLIC PANEL - Abnormal; Notable for the following components:   Sodium 134 (*)    Potassium 3.3 (*)    CO2 20 (*)    Glucose, Bld 176 (*)    Total Protein 8.7 (*)    All other components within normal limits  ETHANOL  RAPID URINE DRUG SCREEN, HOSP PERFORMED    EKG None  Radiology CT Head Wo Contrast  Result Date: 09/13/2020 CLINICAL DATA:  Recent seizure activity EXAM: CT HEAD WITHOUT CONTRAST TECHNIQUE: Contiguous axial images were obtained from the base of the skull through the vertex without intravenous contrast. COMPARISON:  07/03/2019 FINDINGS: Brain: No evidence of acute infarction, hemorrhage, hydrocephalus, extra-axial collection or mass lesion/mass effect. Vascular: No hyperdense vessel or unexpected calcification. Skull: Normal. Negative for fracture or focal lesion. Sinuses/Orbits: No acute finding. Other: None. IMPRESSION: No acute intracranial abnormality noted. Electronically Signed   By: Inez Catalina M.D.   On: 09/13/2020 20:08    Procedures Procedures (including critical care time)  Medications Ordered in ED Medications  levETIRAcetam (KEPPRA) IVPB 1000 mg/100 mL premix (1,000 mg Intravenous New Bag/Given (Non-Interop) 09/13/20 2006)    ED Course  I have reviewed the triage vital signs and the nursing notes.  Pertinent labs & imaging results that were available during my care  of the patient were reviewed by me and considered in my medical decision making (see chart for details).    MDM Rules/Calculators/A&P                          Patient with seizures.  She was placed on Keppra and will follow up with neurology     This patient presents to the ED for concern of seizure this involves an extensive number of treatment options, and is a complaint that carries  with it a high risk of complications and morbidity.  The differential diagnosis includes seizures   Lab Tests:   I Ordered, reviewed, and interpreted labs, which included CBC chemistries show elevated white count and hemoglobin  Medicines ordered:   I ordered medication Keppra for seizure  Imaging Studies ordered:   I ordered imaging studies which included CT  I independently visualized and interpreted imaging which showed negative  Additional history obtained:   Additional history obtained from records  Previous records obtained and reviewed.  Consultations Obtained:   Patient is referred to neurology as outpatient  Reevaluation:  After the interventions stated above, I reevaluated the patient and found improved  Critical Interventions:  .   Final Clinical Impression(s) / ED Diagnoses Final diagnoses:  Seizure [facility])    Rx / DC Orders ED Discharge Orders         Ordered    levETIRAcetam (KEPPRA) 500 MG tablet  2 times daily        09/13/20 2326           Milton Ferguson, MD 09/16/20 1005

## 2020-09-13 NOTE — ED Notes (Signed)
Family in room  

## 2020-09-13 NOTE — ED Triage Notes (Signed)
EMS brought pt in for seizure activity. Per EMS seizure was witnessed at [employer] for 3-4 minutes. Post- ictal on arrival. Pt c/o left arm pain. No other injury reported. Pt does have evidence of biting tongue. Pt pt is alert and oriented x 4 at this time.

## 2020-09-13 NOTE — Discharge Instructions (Addendum)
Follow up with dr. Merlene Laughter in 1-2 weeks.  No driving until you see dr. Merlene Laughter and he states it is ok to drive

## 2020-10-01 ENCOUNTER — Emergency Department (HOSPITAL_COMMUNITY): Payer: Self-pay

## 2020-10-01 ENCOUNTER — Other Ambulatory Visit: Payer: Self-pay

## 2020-10-01 ENCOUNTER — Emergency Department (HOSPITAL_COMMUNITY)
Admission: EM | Admit: 2020-10-01 | Discharge: 2020-10-01 | Disposition: A | Discharge status: Home / Self Care | Payer: Self-pay | Attending: Emergency Medicine | Admitting: Emergency Medicine

## 2020-10-01 ENCOUNTER — Encounter (HOSPITAL_COMMUNITY): Payer: Self-pay | Admitting: *Deleted

## 2020-10-01 DIAGNOSIS — Z87891 Personal history of nicotine dependence: Secondary | ICD-10-CM | POA: Insufficient documentation

## 2020-10-01 DIAGNOSIS — S42295A Other nondisplaced fracture of upper end of left humerus, initial encounter for closed fracture: Secondary | ICD-10-CM | POA: Insufficient documentation

## 2020-10-01 DIAGNOSIS — W19XXXA Unspecified fall, initial encounter: Secondary | ICD-10-CM | POA: Insufficient documentation

## 2020-10-01 DIAGNOSIS — Z79899 Other long term (current) drug therapy: Secondary | ICD-10-CM | POA: Insufficient documentation

## 2020-10-01 DIAGNOSIS — Z8505 Personal history of malignant neoplasm of liver: Secondary | ICD-10-CM | POA: Insufficient documentation

## 2020-10-01 MED ORDER — METHOCARBAMOL 750 MG PO TABS: 750.0000 mg | ORAL_TABLET | Freq: Two times a day (BID) | ORAL | 0 refills | Status: DC | PRN

## 2020-10-01 MED ORDER — LIDOCAINE 5 % EX PTCH: 1.0000 | MEDICATED_PATCH | CUTANEOUS | 0 refills | Status: DC

## 2020-10-01 MED ORDER — IBUPROFEN 400 MG PO TABS
600.0000 mg | ORAL_TABLET | Freq: Once | ORAL | Status: AC
  Administered 2020-10-01: 600 mg via ORAL
  Filled 2020-10-01: qty 2

## 2020-10-01 MED ORDER — IBUPROFEN 600 MG PO TABS: 600.0000 mg | ORAL_TABLET | Freq: Four times a day (QID) | ORAL | 0 refills | Status: DC | PRN

## 2020-10-01 MED ORDER — ACETAMINOPHEN 325 MG PO TABS
650.0000 mg | ORAL_TABLET | Freq: Once | ORAL | Status: AC
  Administered 2020-10-01: 650 mg via ORAL
  Filled 2020-10-01: qty 2

## 2020-10-01 MED ORDER — ONDANSETRON 4 MG PO TBDP: 4.0000 mg | ORAL_TABLET | Freq: Three times a day (TID) | ORAL | 0 refills | Status: DC | PRN

## 2020-10-01 MED ORDER — HYDROCODONE-ACETAMINOPHEN 5-325 MG PO TABS: 1.0000 | ORAL_TABLET | Freq: Four times a day (QID) | ORAL | 0 refills | Status: DC | PRN

## 2020-10-01 NOTE — ED Notes (Signed)
Pt verbalized understanding of no driving and to use caution within 4 hours of taking pain meds due to meds cause drowsiness 

## 2020-10-01 NOTE — ED Notes (Signed)
Patient transported to CT 

## 2020-10-01 NOTE — ED Provider Notes (Signed)
[facility] EMERGENCY DEPARTMENT Provider Note   CSN: [id number] Arrival date & time: 10/01/20  1755     History Chief Complaint  Patient presents with  . Arm Injury    Misty Howell is a 54 y.o. female.  HPI      Misty Howell is a 54 y.o. female, with a history of seizure and anxiety, presenting to the ED with pain to the left upper arm since a fall she sustained November 4.  Patient states she was treated in the emergency department following a seizure and subsequent fall on November 4.  She did not know left arm pain until she awoke the next day.  She did not seek treatment because she thought it would "heal on its own."  Pain is aching and sharp, sometimes radiating toward the left shoulder and left neck. She endorses inability to flex at the elbow.  Denies anticoagulation. Denies fever/chills, numbness, hand weakness, or any other complaints.  Past Medical History:  Diagnosis Date  . Anxiety   . Cancer ([place])    Giant cell tumor left fifth finger.  . Hypercholesterolemia   . Overweight(278.02)   . Seizures ([place])   . Syncope and collapse   . Vision abnormalities   . Vitamin D deficiency     Patient Active Problem List   Diagnosis Date Noted  . Hyperglycemia 08/08/2017  . Hypokalemia 08/07/2017  . Seizure ([place]) 11/24/2013  . Collapse 11/24/2013  . Vitamin D deficiency 07/06/2010  . Hyperlipidemia 07/05/2010  . OVERWEIGHT 03/31/2008  . Anxiety state 03/30/2008    Past Surgical History:  Procedure Laterality Date  . CESAREAN SECTION    . FINGER SURGERY     due to giant cell tumor, hip bone placed in finger     OB History   No obstetric history on file.     Family History  Problem Relation Age of Onset  . Hypertension Mother   . Migraines Mother   . Hypertension Father     Social History   Tobacco Use  . Smoking status: Former Smoker    Packs/day: 0.00    Years: 3.00    Pack years: 0.00    Types: Cigarettes    Quit date: 01/20/1989     Years since quitting: 31.7  . Smokeless tobacco: Never Used  Vaping Use  . Vaping Use: Never used  Substance Use Topics  . Alcohol use: Not Currently    Comment: social  . Drug use: No    Home Medications Prior to Admission medications   Medication Sig Start Date End Date Taking? Authorizing Provider  HYDROcodone-acetaminophen (NORCO/VICODIN) 5-325 MG tablet Take 1 tablet by mouth every 6 (six) hours as needed for severe pain. 10/01/20   Ray Glacken C, PA-C  ibuprofen (ADVIL) 600 MG tablet Take 1 tablet (600 mg total) by mouth every 6 (six) hours as needed. 10/01/20   Caydance Kuehnle C, PA-C  levETIRAcetam (KEPPRA) 500 MG tablet Take 1 tablet (500 mg total) by mouth 2 (two) times daily. 09/13/20   Milton Ferguson, MD  lidocaine (LIDODERM) 5 % Place 1 patch onto the skin daily. Remove & Discard patch within 12 hours or as directed by MD 10/01/20   Linde Wilensky, Helane Gunther, PA-C  methocarbamol (ROBAXIN) 750 MG tablet Take 1 tablet (750 mg total) by mouth 2 (two) times daily as needed for muscle spasms (or muscle tightness). 10/01/20   Chayse Gracey C, PA-C  ondansetron (ZOFRAN ODT) 4 MG disintegrating tablet Take 1 tablet (  4 mg total) by mouth every 8 (eight) hours as needed for nausea or vomiting. 10/01/20   Edelin Fryer, Helane Gunther, PA-C    Allergies    Patient has no known allergies.  Review of Systems   Review of Systems  Constitutional: Negative for chills, diaphoresis and fever.  Musculoskeletal: Positive for arthralgias and myalgias.  Neurological: Positive for weakness. Negative for numbness.    Physical Exam Updated Vital Signs BP (!) 140/95 (BP Location: Right Arm)   Pulse (!) 122   Temp 98.2 F (36.8 C) (Oral)   Resp 20   Ht 5\' 4"  (1.626 m)   Wt 86.2 kg   LMP 09/24/2020   SpO2 98%   BMI 32.61 kg/m   Physical Exam Vitals and nursing note reviewed.  Constitutional:      General: She is not in acute distress.    Appearance: She is well-developed. She is not diaphoretic.  HENT:     Head:  Normocephalic and atraumatic.  Eyes:     Conjunctiva/sclera: Conjunctivae normal.  Cardiovascular:     Rate and Rhythm: Normal rate and regular rhythm.     Pulses:          Radial pulses are 2+ on the left side.  Pulmonary:     Effort: Pulmonary effort is normal.  Musculoskeletal:     Cervical back: Neck supple.     Comments: Bruising and tenderness to the left biceps region.  No noted deformity.  Compartments are soft. Full range of motion in the left wrist and fingers.   No tenderness, swelling, deformity, bruising to the proximal humerus, left shoulder, clavicle, elbow, or neck.  Skin:    General: Skin is warm and dry.     Capillary Refill: Capillary refill takes less than 2 seconds.     Coloration: Skin is not pale.  Neurological:     Mental Status: She is alert.     Comments: Sensation to light touch grossly intact in the left hand, fingers, and forearm. Grip strength equal bilaterally. Strength 5/5 in the bilateral wrists. Patient has no ability to flex using the left bicep.  Psychiatric:        Behavior: Behavior normal.     ED Results / Procedures / Treatments   Labs (all labs ordered are listed, but only abnormal results are displayed) Labs Reviewed - No data to display  EKG None  Radiology DG Elbow Complete Left  Result Date: 10/01/2020 CLINICAL DATA:  Status post fall. EXAM: LEFT ELBOW - COMPLETE 3+ VIEW COMPARISON:  None. FINDINGS: There is no evidence of fracture, dislocation, or joint effusion. There is no evidence of arthropathy or other focal bone abnormality. Soft tissues are unremarkable. IMPRESSION: Negative. Electronically Signed   By: Virgina Norfolk M.D.   On: 10/01/2020 19:38   CT Shoulder Left Wo Contrast  Result Date: 10/01/2020 CLINICAL DATA:  History of recent seizure with left shoulder pain. EXAM: CT OF THE UPPER LEFT EXTREMITY WITHOUT CONTRAST TECHNIQUE: Multidetector CT imaging of the upper left extremity was performed according to the  standard protocol. COMPARISON:  None. FINDINGS: Bones/Joint/Cartilage A subacute nondisplaced, comminuted fracture deformity is seen extending through the surgical neck of the proximal left humerus and adjacent portion of the left humeral head. There is no evidence of dislocation. Ligaments Suboptimally assessed by CT. Muscles and Tendons The muscles and tendons are limited in evaluation and appear to be intact. Soft tissues A mild-to-moderate amount of ill-defined, slightly lobulated increased attenuation is seen adjacent to the  anterior and anterolateral aspects of the previously noted fracture site. Mild extension along the proximal humeral shaft is seen. This is of indeterminate age. IMPRESSION: Subacute fracture of the proximal left humerus. MRI correlation is recommended as a small amount of hemorrhage within the adjacent soft tissues cannot be excluded. Electronically Signed   By: Virgina Norfolk M.D.   On: 10/01/2020 21:37   DG Humerus Left  Result Date: 10/01/2020 CLINICAL DATA:  Pain EXAM: LEFT HUMERUS - 2+ VIEW COMPARISON:  None. FINDINGS: There is an acute to subacute appearing fracture involving the proximal left humerus at the level of the surgical neck. There is a fracture plane that appears to extend into the greater tuberosity without evidence for displaced fracture fragment. There is surrounding soft tissue swelling. IMPRESSION: Acute to subacute appearing fracture involving the proximal left humerus at the level of the surgical neck. Electronically Signed   By: Constance Holster M.D.   On: 10/01/2020 19:39    Procedures Procedures (including critical care time)  Medications Ordered in ED Medications  ibuprofen (ADVIL) tablet 600 mg (600 mg Oral Given 10/01/20 2025)  acetaminophen (TYLENOL) tablet 650 mg (650 mg Oral Given 10/01/20 2025)    ED Course  I have reviewed the triage vital signs and the nursing notes.  Pertinent labs & imaging results that were available during my  care of the patient were reviewed by me and considered in my medical decision making (see chart for details).  Clinical Course as of Oct 01 2242  Mon Oct 01, 2020  2001 Spoke with Dr. Griffin Basil, orthopedic surgeon.  Discussed the patient's physical exam findings and x-ray abnormalities. Requests CT of the shoulder, place patient in sling, and he will see her in the office.   [SJ]    Clinical Course User Index [SJ] Margia Wiesen C, PA-C   MDM Rules/Calculators/A&P                          Patient presents for evaluation of persistent left arm pain following a fall that occurred on November 4. She has no evidence of circulation deficit or compartment syndrome. I personally reviewed and interpreted the patient's imaging studies. She has evidence of a proximal humeral fracture on x-ray and confirmed on CT. My suspicion for active hemorrhage at this point is low as the patient's bruising appears to be older and her arm compartments are soft. She was placed in a sling immobilizer and will follow up with orthopedics as an outpatient. The patient was given instructions for home care as well as return precautions. Patient voices understanding of these instructions, accepts the plan, and is comfortable with discharge.     Final Clinical Impression(s) / ED Diagnoses Final diagnoses:  Fall, initial encounter  Other closed nondisplaced fracture of proximal end of left humerus, initial encounter    Rx / DC Orders ED Discharge Orders         Ordered    methocarbamol (ROBAXIN) 750 MG tablet  2 times daily PRN        10/01/20 2217    lidocaine (LIDODERM) 5 %  Every 24 hours        10/01/20 2217    HYDROcodone-acetaminophen (NORCO/VICODIN) 5-325 MG tablet  Every 6 hours PRN        10/01/20 2217    ondansetron (ZOFRAN ODT) 4 MG disintegrating tablet  Every 8 hours PRN        10/01/20 2217    ibuprofen (  ADVIL) 600 MG tablet  Every 6 hours PRN        10/01/20 2221           Layla Maw 10/01/20 2246    Fredia Sorrow, MD 10/16/20 1418

## 2020-10-01 NOTE — Discharge Instructions (Addendum)
[facility]  Fracture care There is evidence of a fracture on the x-ray. Pain:  Antiinflammatory medications: Take 600 mg of ibuprofen every 6 hours or 440 mg (over the counter dose) to 500 mg (prescription dose) of naproxen every 12 hours for the next 3 days. After this time, these medications may be used as needed for pain. Take these medications with food to avoid upset stomach. Choose only one of these medications, do not take them together. Acetaminophen (generic for Tylenol): Should you continue to have additional pain while taking the ibuprofen or naproxen, you may add in acetaminophen as needed. Your daily total maximum amount of acetaminophen from all sources should be limited to 4000mg /day for persons without liver problems, or 2000mg /day for those with liver problems. Vicodin: May take Vicodin (hydrocodone-acetaminophen) as needed for severe pain.   Do not drive or perform other dangerous activities while taking this medication as it can cause drowsiness as well as changes in reaction time and judgement.   Please note that each pill of Vicodin contains 325 mg of acetaminophen (generic for Tylenol) and the above dosage limits apply. Methocarbamol: Methocarbamol (generic for Robaxin) is a muscle relaxer and can help relieve stiff muscles or muscle spasms.  Do not drive or perform other dangerous activities while taking this medication as it can cause drowsiness as well as changes in reaction time and judgement. Lidocaine patches: These are available via either prescription or over-the-counter. The over-the-counter option may be more economical one and are likely just as effective. There are multiple over-the-counter brands, such as Salonpas. Ice: May apply ice to the injured area for no more than 15 minutes at a time to reduce swelling and pain. Elevation: Keep the extremity elevated whenever possible to reduce swelling and pain.  In this case, this means keeping the arm in the sling and  elevating the shoulder by sitting in a recliner or with your back propped up on pillows Sling: Keep the sling in place at all times to stabilize the fracture.  May remove the sling for bathing. Follow-up: Follow-up with the orthopedic specialist for any further management of this issue.  Call the number provided to set up an appointment. Return: Return to the emergency department for severely increased pain, numbness, blanching of the skin, or any other major concerns.

## 2020-10-01 NOTE — ED Triage Notes (Signed)
Pt c/o left arm pain that radiates into neck since she fell from having a seizure a few weeks ago. Pt's left arm has dark purple discoloration. Pt reports she cannot lift her arm. Pt has used hot and cold packs with small amount of relief.

## 2020-10-01 NOTE — ED Notes (Signed)
Patient transported to X-ray 

## 2020-11-08 NOTE — Addendum Note (Signed)
Encounter addended by: Marcelina Morel, RN on: 11/08/2020 11:19 PM  Actions taken: Letter saved

## 2021-02-12 ENCOUNTER — Ambulatory Visit: Payer: Medicaid Other | Admitting: Physician Assistant

## 2021-03-05 ENCOUNTER — Ambulatory Visit: Payer: Medicaid Other | Admitting: Physician Assistant

## 2021-05-25 ENCOUNTER — Emergency Department (HOSPITAL_COMMUNITY)
Admission: EM | Admit: 2021-05-25 | Discharge: 2021-05-25 | Disposition: A | Discharge status: Home / Self Care | Payer: Medicaid Other | Attending: Emergency Medicine | Admitting: Emergency Medicine

## 2021-05-25 ENCOUNTER — Emergency Department (HOSPITAL_COMMUNITY): Payer: Medicaid Other

## 2021-05-25 ENCOUNTER — Other Ambulatory Visit: Payer: Self-pay

## 2021-05-25 ENCOUNTER — Encounter (HOSPITAL_COMMUNITY): Payer: Self-pay | Admitting: Emergency Medicine

## 2021-05-25 DIAGNOSIS — S0101XA Laceration without foreign body of scalp, initial encounter: Secondary | ICD-10-CM | POA: Insufficient documentation

## 2021-05-25 DIAGNOSIS — R569 Unspecified convulsions: Secondary | ICD-10-CM

## 2021-05-25 DIAGNOSIS — G40909 Epilepsy, unspecified, not intractable, without status epilepticus: Secondary | ICD-10-CM | POA: Insufficient documentation

## 2021-05-25 DIAGNOSIS — S0990XA Unspecified injury of head, initial encounter: Secondary | ICD-10-CM

## 2021-05-25 DIAGNOSIS — Z87891 Personal history of nicotine dependence: Secondary | ICD-10-CM | POA: Insufficient documentation

## 2021-05-25 DIAGNOSIS — W228XXA Striking against or struck by other objects, initial encounter: Secondary | ICD-10-CM | POA: Insufficient documentation

## 2021-05-25 DIAGNOSIS — Z85828 Personal history of other malignant neoplasm of skin: Secondary | ICD-10-CM | POA: Insufficient documentation

## 2021-05-25 DIAGNOSIS — Z79899 Other long term (current) drug therapy: Secondary | ICD-10-CM | POA: Insufficient documentation

## 2021-05-25 DIAGNOSIS — Y900 Blood alcohol level of less than 20 mg/100 ml: Secondary | ICD-10-CM | POA: Insufficient documentation

## 2021-05-25 LAB — CBC WITH DIFFERENTIAL/PLATELET
Abs Immature Granulocytes: 0.08 10*3/uL — ABNORMAL HIGH (ref 0.00–0.07)
Basophils Absolute: 0 10*3/uL (ref 0.0–0.1)
Basophils Relative: 0 %
Eosinophils Absolute: 0 10*3/uL (ref 0.0–0.5)
Eosinophils Relative: 0 %
HCT: 44.2 % (ref 36.0–46.0)
Hemoglobin: 15.4 g/dL — ABNORMAL HIGH (ref 12.0–15.0)
Immature Granulocytes: 1 %
Lymphocytes Relative: 21 %
Lymphs Abs: 1.8 10*3/uL (ref 0.7–4.0)
MCH: 31.7 pg (ref 26.0–34.0)
MCHC: 34.8 g/dL (ref 30.0–36.0)
MCV: 90.9 fL (ref 80.0–100.0)
Monocytes Absolute: 0.5 10*3/uL (ref 0.1–1.0)
Monocytes Relative: 6 %
Neutro Abs: 6.2 10*3/uL (ref 1.7–7.7)
Neutrophils Relative %: 72 %
Platelets: 254 10*3/uL (ref 150–400)
RBC: 4.86 MIL/uL (ref 3.87–5.11)
RDW: 12.8 % (ref 11.5–15.5)
WBC: 8.6 10*3/uL (ref 4.0–10.5)
nRBC: 0 % (ref 0.0–0.2)

## 2021-05-25 LAB — COMPREHENSIVE METABOLIC PANEL
ALT: 24 U/L (ref 0–44)
AST: 30 U/L (ref 15–41)
Albumin: 4.4 g/dL (ref 3.5–5.0)
Alkaline Phosphatase: 103 U/L (ref 38–126)
Anion gap: 12 (ref 5–15)
BUN: 9 mg/dL (ref 6–20)
CO2: 22 mmol/L (ref 22–32)
Calcium: 8.9 mg/dL (ref 8.9–10.3)
Chloride: 99 mmol/L (ref 98–111)
Creatinine, Ser: 0.77 mg/dL (ref 0.44–1.00)
GFR, Estimated: >60 mL/min (ref >60)
Glucose, Bld: 205 mg/dL — ABNORMAL HIGH (ref 70–99)
Potassium: 3.4 mmol/L — ABNORMAL LOW (ref 3.5–5.1)
Sodium: 133 mmol/L — ABNORMAL LOW (ref 135–145)
Total Bilirubin: 0.9 mg/dL (ref 0.3–1.2)
Total Protein: 8.4 g/dL — ABNORMAL HIGH (ref 6.5–8.1)

## 2021-05-25 LAB — ETHANOL: Alcohol, Ethyl (B): <10 mg/dL (ref <10)

## 2021-05-25 IMAGING — CT CT SHOULDER*L* W/O CM
1 series · 12 of 14 positions shown, 15 images · non-contrast
Comparison: None.

CLINICAL DATA: History of recent seizure with left shoulder pain.

EXAM:
CT OF THE UPPER LEFT EXTREMITY WITHOUT CONTRAST
TECHNIQUE: Multidetector CT imaging of the upper left extremity was performed
according to the standard protocol.

[Series 7: ax st · axial · 0.32mm/px · z∈[-92,+10]mm · 12 of 72 slices shown, 15 images]
[im 6/72  soft-tissue]
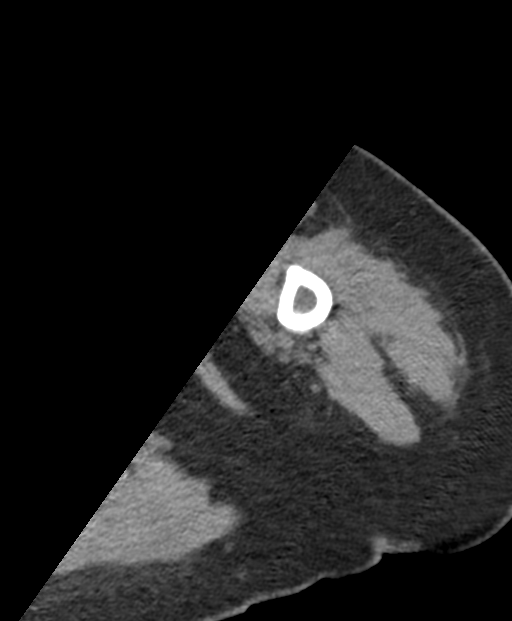
[im 6/72  bone]
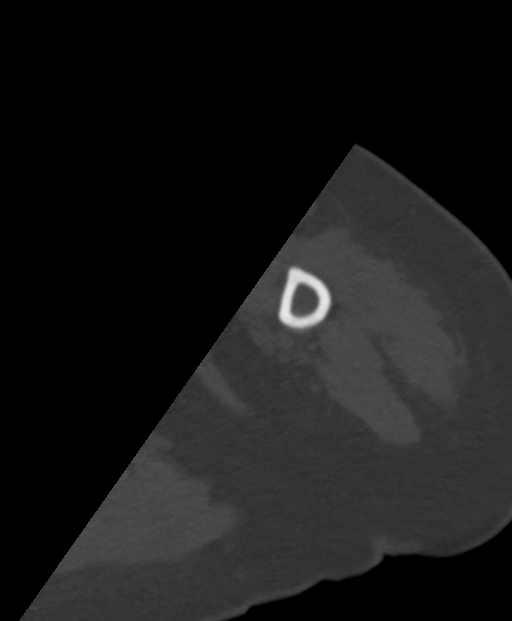
[im 11/72  bone]
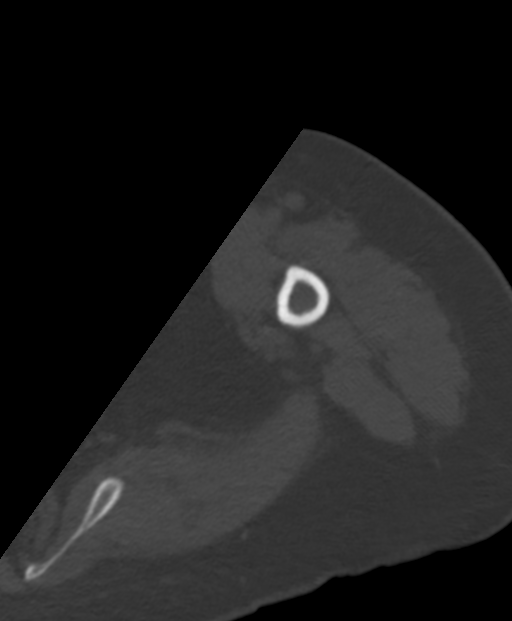
[im 17/72  bone]
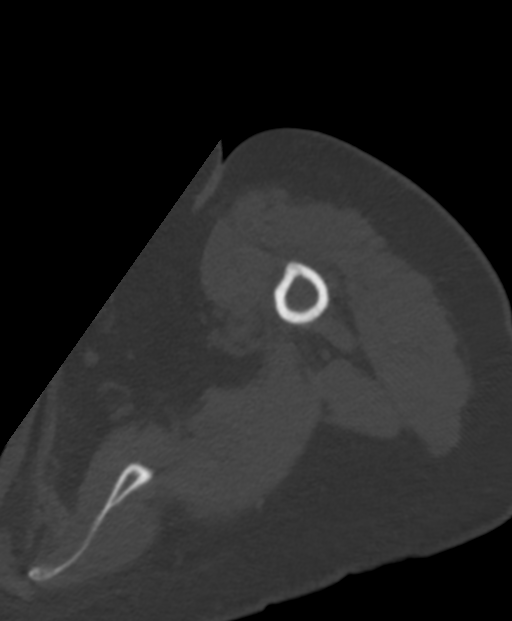
[im 22/72  bone]
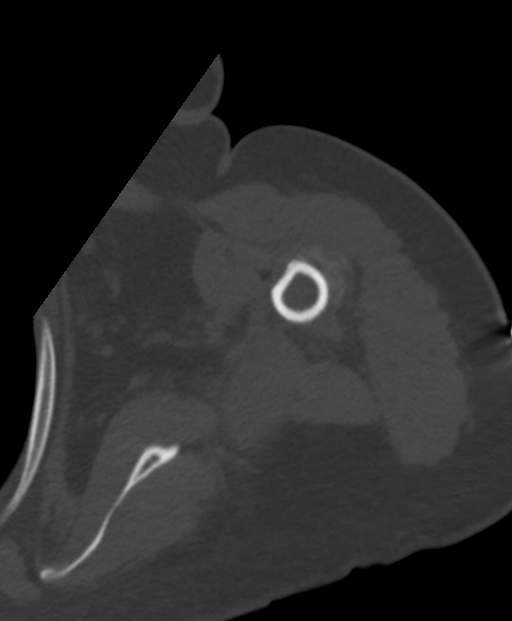
[im 28/72  soft-tissue]
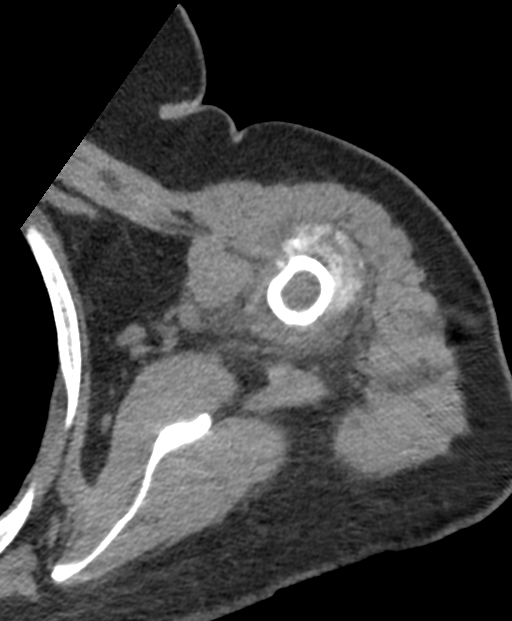
[im 28/72  bone]
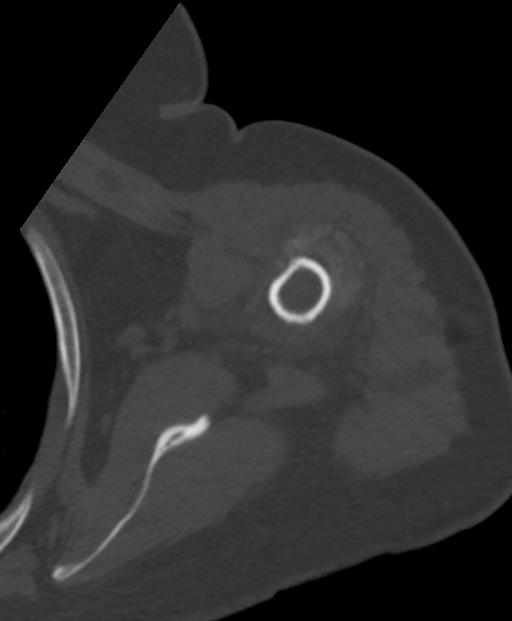
[im 33/72  bone]
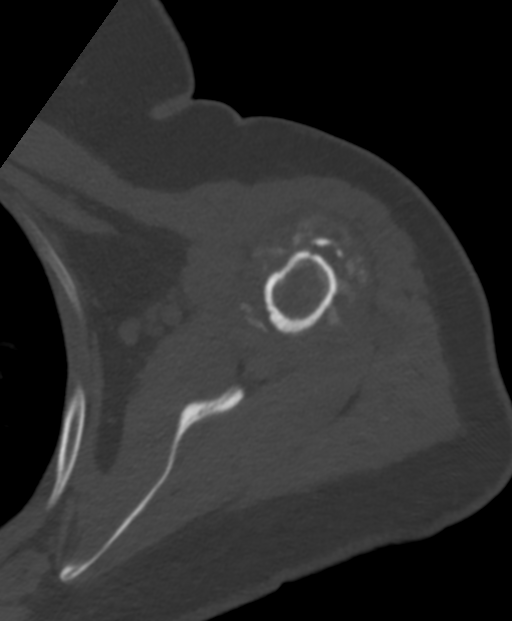
[im 39/72  bone]
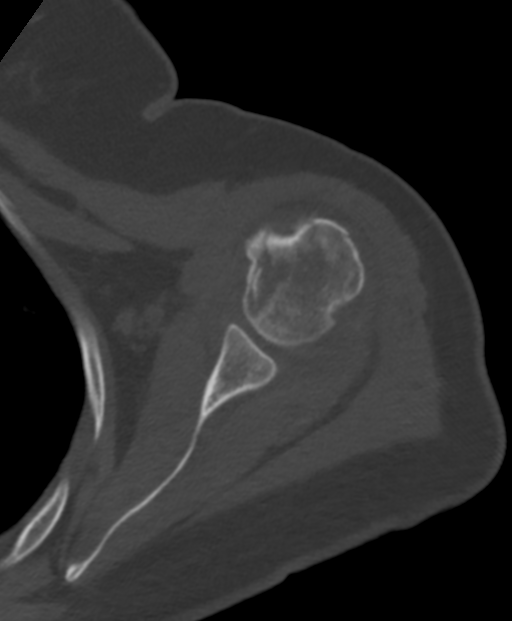
[im 44/72  bone]
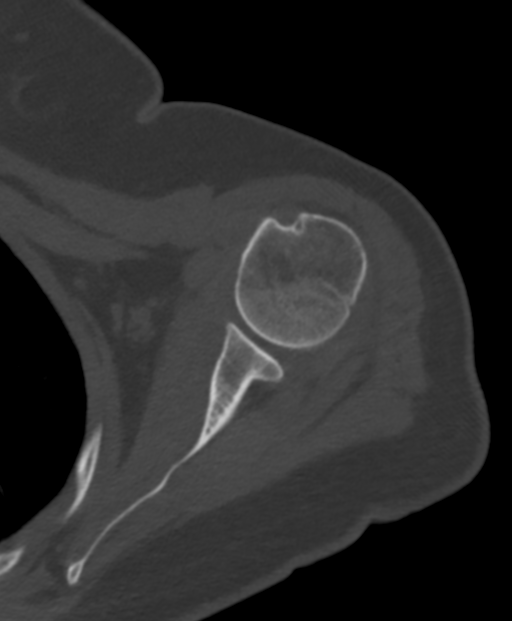
[im 50/72  soft-tissue]
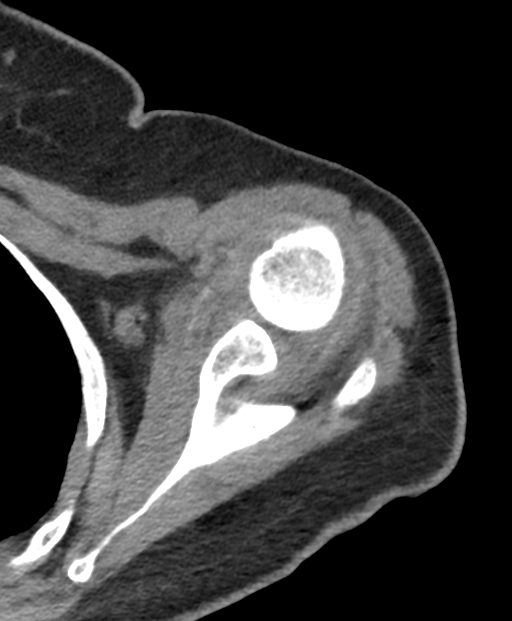
[im 50/72  bone]
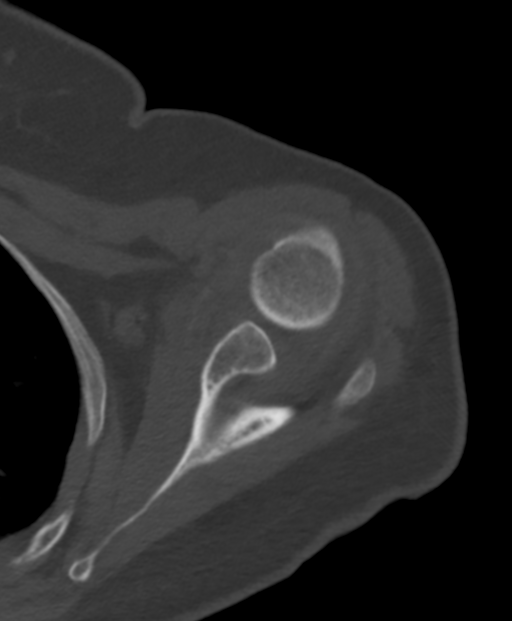
[im 55/72  bone]
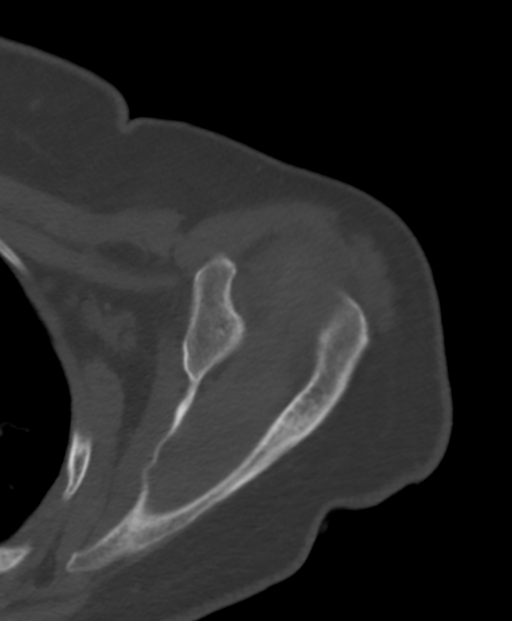
[im 61/72  bone]
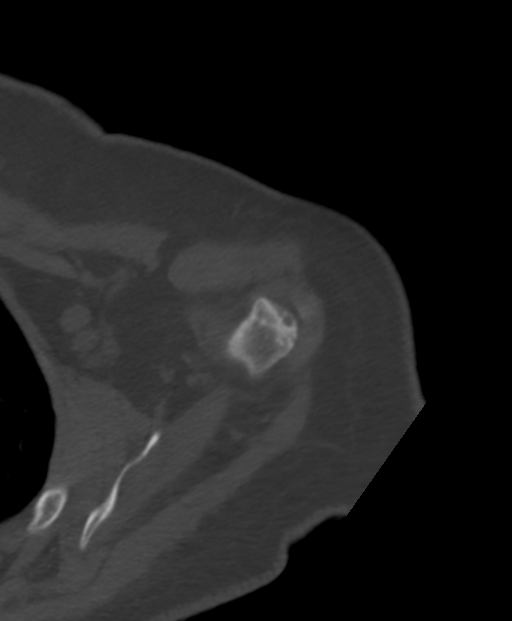
[im 66/72  bone]
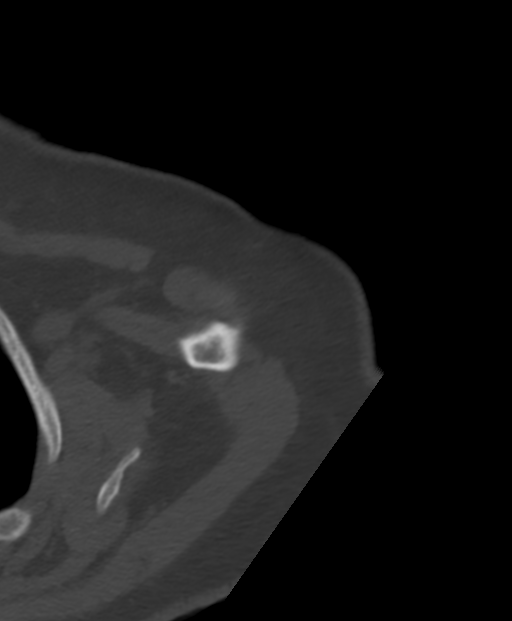

[12 of 14 positions shown; findings below may reference images not displayed]

FINDINGS: Bones/Joint/Cartilage

A subacute nondisplaced, comminuted fracture deformity is seen
extending through the surgical neck of the proximal left humerus and
adjacent portion of the left humeral head. There is no evidence of
dislocation.

Ligaments

Suboptimally assessed by CT.

Muscles and Tendons

The muscles and tendons are limited in evaluation and appear to be
intact.

Soft tissues

A mild-to-moderate amount of ill-defined, slightly lobulated
increased attenuation is seen adjacent to the anterior and
anterolateral aspects of the previously noted fracture site. Mild
extension along the proximal humeral shaft is seen. This is of
indeterminate age.
IMPRESSION: Subacute fracture of the proximal left humerus. MRI correlation is
recommended as a small amount of hemorrhage within the adjacent soft
tissues cannot be excluded.

## 2021-05-25 MED ORDER — LEVETIRACETAM IN NACL 1000 MG/100ML IV SOLN
1000.0000 mg | Freq: Once | INTRAVENOUS | Status: AC
  Administered 2021-05-25: 1000 mg via INTRAVENOUS
  Filled 2021-05-25: qty 100

## 2021-05-25 MED ORDER — LEVETIRACETAM 500 MG PO TABS
500.0000 mg | ORAL_TABLET | Freq: Two times a day (BID) | ORAL | 1 refills | Status: DC
Start: 2021-05-25 — End: 2022-01-17

## 2021-05-25 MED ORDER — HYDROXYZINE HCL 25 MG PO TABS: 25.0000 mg | ORAL_TABLET | Freq: Four times a day (QID) | ORAL | 1 refills | Status: DC | PRN

## 2021-05-25 NOTE — ED Notes (Signed)
Put c-collar on.

## 2021-05-25 NOTE — ED Provider Notes (Signed)
[facility] Provider Note   CSN: [id number] Arrival date & time: 05/25/21  8527     History Chief Complaint  Patient presents with   Seizures    Misty Howell is a 55 y.o. female.  Patient states she had a seizure and hit her head.  Patient has a history of seizures but did not take her medicines.  She has not had a seizure in a long time  The history is provided by the patient. No language interpreter was used.  Seizures Seizure activity on arrival: no   Seizure type:  Grand mal Preceding symptoms: no sensation of an aura present   Initial focality:  None Episode characteristics: no abnormal movements   Postictal symptoms: confusion   Return to baseline: yes   Severity:  Mild Timing:  Once Progression:  Improving Context: not alcohol withdrawal       Past Medical History:  Diagnosis Date   Anxiety    Cancer ([place])    Giant cell tumor left fifth finger.   Hypercholesterolemia    Overweight(278.02)    Seizures ([facility])    Syncope and collapse    Vision abnormalities    Vitamin D deficiency     Patient Active Problem List   Diagnosis Date Noted   Hyperglycemia 08/08/2017   Hypokalemia 08/07/2017   Seizure ([place]) 11/24/2013   Collapse 11/24/2013   Vitamin D deficiency 07/06/2010   Hyperlipidemia 07/05/2010   OVERWEIGHT 03/31/2008   Anxiety state 03/30/2008    Past Surgical History:  Procedure Laterality Date   CESAREAN SECTION     FINGER SURGERY     due to giant cell tumor, hip bone placed in finger     OB History   No obstetric history on file.     Family History  Problem Relation Age of Onset   Hypertension Mother    Migraines Mother    Hypertension Father     Social History   Tobacco Use   Smoking status: Former    Packs/day: 0.00    Years: 3.00    Pack years: 0.00    Types: Cigarettes    Quit date: 01/20/1989    Years since quitting: 32.3   Smokeless tobacco: Never  Vaping Use   Vaping Use: Never used   Substance Use Topics   Alcohol use: Not Currently    Comment: social   Drug use: No    Home Medications Prior to Admission medications   Medication Sig Start Date End Date Taking? Authorizing Provider  hydrOXYzine (ATARAX/VISTARIL) 25 MG tablet Take 1 tablet (25 mg total) by mouth every 6 (six) hours as needed for anxiety. 05/25/21  Yes Milton Ferguson, MD  levETIRAcetam (KEPPRA) 500 MG tablet Take 1 tablet (500 mg total) by mouth 2 (two) times daily. 05/25/21  Yes Milton Ferguson, MD  HYDROcodone-acetaminophen (NORCO/VICODIN) 5-325 MG tablet Take 1 tablet by mouth every 6 (six) hours as needed for severe pain. 10/01/20   Joy, Shawn C, PA-C  ibuprofen (ADVIL) 600 MG tablet Take 1 tablet (600 mg total) by mouth every 6 (six) hours as needed. 10/01/20   Joy, Shawn C, PA-C  lidocaine (LIDODERM) 5 % Place 1 patch onto the skin daily. Remove & Discard patch within 12 hours or as directed by MD 10/01/20   Joy, Helane Gunther, PA-C  methocarbamol (ROBAXIN) 750 MG tablet Take 1 tablet (750 mg total) by mouth 2 (two) times daily as needed for muscle spasms (or muscle tightness). 10/01/20   Joy,  Shawn C, PA-C  ondansetron (ZOFRAN ODT) 4 MG disintegrating tablet Take 1 tablet (4 mg total) by mouth every 8 (eight) hours as needed for nausea or vomiting. 10/01/20   Joy, Helane Gunther, PA-C    Allergies    Patient has no known allergies.  Review of Systems   Review of Systems  Constitutional:  Negative for appetite change and fatigue.  HENT:  Negative for congestion, ear discharge and sinus pressure.   Eyes:  Negative for discharge.  Respiratory:  Negative for cough.   Cardiovascular:  Negative for chest pain.  Gastrointestinal:  Negative for abdominal pain and diarrhea.  Genitourinary:  Negative for frequency and hematuria.  Musculoskeletal:  Negative for back pain.  Skin:  Negative for rash.  Neurological:  Positive for seizures. Negative for headaches.  Psychiatric/Behavioral:  Negative for hallucinations.     Physical Exam Updated Vital Signs BP (!) 141/113   Pulse (!) 107   Temp 98.6 F (37 C) (Oral)   Resp (!) 29   Ht 5\' 4"  (1.626 m)   Wt 89.8 kg   LMP  (LMP Unknown)   SpO2 96%   BMI 33.99 kg/m   Physical Exam Vitals and nursing note reviewed.  Constitutional:      Appearance: She is well-developed.  HENT:     Head: Normocephalic.     Comments: 2 cm laceration to the occipital head    Nose: Nose normal.  Eyes:     General: No scleral icterus.    Conjunctiva/sclera: Conjunctivae normal.  Neck:     Thyroid: No thyromegaly.  Cardiovascular:     Rate and Rhythm: Normal rate and regular rhythm.     Heart sounds: No murmur heard.   No friction rub. No gallop.  Pulmonary:     Breath sounds: No stridor. No wheezing or rales.  Chest:     Chest wall: No tenderness.  Abdominal:     General: There is no distension.     Tenderness: There is no abdominal tenderness. There is no rebound.  Musculoskeletal:        General: Normal range of motion.     Cervical back: Neck supple.  Lymphadenopathy:     Cervical: No cervical adenopathy.  Skin:    Findings: No erythema or rash.  Neurological:     Mental Status: She is alert and oriented to person, place, and time.     Motor: No abnormal muscle tone.     Coordination: Coordination normal.  Psychiatric:        Behavior: Behavior normal.    ED Results / Procedures / Treatments   Labs (all labs ordered are listed, but only abnormal results are displayed) Labs Reviewed  CBC WITH DIFFERENTIAL/PLATELET - Abnormal; Notable for the following components:      Result Value   Hemoglobin 15.4 (*)    Abs Immature Granulocytes 0.08 (*)    All other components within normal limits  COMPREHENSIVE METABOLIC PANEL - Abnormal; Notable for the following components:   Sodium 133 (*)    Potassium 3.4 (*)    Glucose, Bld 205 (*)    Total Protein 8.4 (*)    All other components within normal limits  ETHANOL    EKG None  Radiology CT Head  Wo Contrast  Result Date: 05/25/2021 CLINICAL DATA:  Head trauma, altered mental status. EXAM: CT HEAD WITHOUT CONTRAST CT CERVICAL SPINE WITHOUT CONTRAST TECHNIQUE: Multidetector CT imaging of the head and cervical spine was performed following the standard protocol  without intravenous contrast. Multiplanar CT image reconstructions of the cervical spine were also generated. COMPARISON:  None. FINDINGS: CT HEAD FINDINGS Brain: No acute intracranial hemorrhage. No focal mass lesion. No CT evidence of acute infarction. No midline shift or mass effect. No hydrocephalus. Basilar cisterns are patent. Vascular: No hyperdense vessel or unexpected calcification. Skull: Normal. Negative for fracture or focal lesion. Sinuses/Orbits: Paranasal sinuses and mastoid air cells are clear. Orbits are clear. Other: None. CT CERVICAL SPINE FINDINGS Alignment: Straightening of the normal cervical lordosis. Skull base and vertebrae: Normal craniocervical junction. No loss of vertebral body height or disc height. Normal facet articulation. No evidence of fracture. Mild anterolisthesis of C3 on C4 by 2 mm favored degenerative in nature. Soft tissues and spinal canal: No prevertebral soft tissue swelling. No perispinal or epidural hematoma. Disc levels:  Unremarkable Upper chest: Clear Other: None IMPRESSION: 1. No acute intracranial findings. 2. No cervical spine fracture. 3. Straightening of the normal cervical lordosis may be secondary to position, muscle spasm, or ligamentous injury. Electronically Signed   By: Suzy Bouchard M.D.   On: 05/25/2021 11:30   CT Cervical Spine Wo Contrast  Result Date: 05/25/2021 CLINICAL DATA:  Head trauma, altered mental status. EXAM: CT HEAD WITHOUT CONTRAST CT CERVICAL SPINE WITHOUT CONTRAST TECHNIQUE: Multidetector CT imaging of the head and cervical spine was performed following the standard protocol without intravenous contrast. Multiplanar CT image reconstructions of the cervical spine were  also generated. COMPARISON:  None. FINDINGS: CT HEAD FINDINGS Brain: No acute intracranial hemorrhage. No focal mass lesion. No CT evidence of acute infarction. No midline shift or mass effect. No hydrocephalus. Basilar cisterns are patent. Vascular: No hyperdense vessel or unexpected calcification. Skull: Normal. Negative for fracture or focal lesion. Sinuses/Orbits: Paranasal sinuses and mastoid air cells are clear. Orbits are clear. Other: None. CT CERVICAL SPINE FINDINGS Alignment: Straightening of the normal cervical lordosis. Skull base and vertebrae: Normal craniocervical junction. No loss of vertebral body height or disc height. Normal facet articulation. No evidence of fracture. Mild anterolisthesis of C3 on C4 by 2 mm favored degenerative in nature. Soft tissues and spinal canal: No prevertebral soft tissue swelling. No perispinal or epidural hematoma. Disc levels:  Unremarkable Upper chest: Clear Other: None IMPRESSION: 1. No acute intracranial findings. 2. No cervical spine fracture. 3. Straightening of the normal cervical lordosis may be secondary to position, muscle spasm, or ligamentous injury. Electronically Signed   By: Suzy Bouchard M.D.   On: 05/25/2021 11:30    Procedures .Marland KitchenLaceration Repair  Date/Time: 05/25/2021 1:22 PM Performed by: Milton Ferguson, MD Authorized by: Milton Ferguson, MD   Comments:     Patient with a 2 cm superficial laceration to the occipital head.  Area was cleaned thoroughly with Betadine.  4 staples were used to close the laceration.  Patient tolerated the procedure well.  No anesthesia was used   Medications Ordered in ED Medications  levETIRAcetam (KEPPRA) IVPB 1000 mg/100 mL premix (0 mg Intravenous Stopped 05/25/21 1127)    ED Course  I have reviewed the triage vital signs and the nursing notes.  Pertinent labs & imaging results that were available during my care of the patient were reviewed by me and considered in my medical decision making (see  chart for details).    MDM Rules/Calculators/A&P                          Patient w with staples.ith seizures.  She was placed  back on her Keppra.  She also had laceration to the occipital area that was sutured  She is told to follow-up with Dr. Merlene Laughter and have the staples removed in a week Final Clinical Impression(s) / ED Diagnoses Final diagnoses:  Seizure ([place])  Injury of head, initial encounter    Rx / DC Orders ED Discharge Orders          Ordered    levETIRAcetam (KEPPRA) 500 MG tablet  2 times daily        05/25/21 1317    hydrOXYzine (ATARAX/VISTARIL) 25 MG tablet  Every 6 hours PRN        05/25/21 1317             Milton Ferguson, MD 05/25/21 1323

## 2021-05-25 NOTE — ED Notes (Signed)
3 cm laceration to right side of head. Bleeding stopped. Ice packed applied to area.

## 2021-05-25 NOTE — ED Notes (Signed)
Nurse called Katharine Look per pt request to ask her to bring pocket book and phone. Katharine Look said ok.

## 2021-05-25 NOTE — Discharge Instructions (Addendum)
Follow-up with Dr. Merlene Laughter in the next couple weeks.  Have your staples removed in 6 to 10 days.  Clean laceration twice a day with soap and water.

## 2021-05-25 NOTE — ED Notes (Signed)
MOM at bedside with pt possessions.

## 2021-05-25 NOTE — ED Triage Notes (Signed)
History of seizures.  Witness seizure by her mother for unknown time.  EMS report pt was in floor with laceration to back of head.  Pt is confused but talking.  Mother poor historian and pt is confused but reports being out of medication for about one month d/t not having insurance.  Pt is on Keppra.

## 2021-05-25 NOTE — ED Notes (Signed)
Pt returned from CT °

## 2021-06-05 ENCOUNTER — Ambulatory Visit
Admission: RE | Admit: 2021-06-05 | Discharge: 2021-06-05 | Disposition: A | Discharge status: Home / Self Care | Payer: Medicaid Other | Source: Ambulatory Visit | Attending: Family Medicine | Admitting: Family Medicine

## 2021-06-05 ENCOUNTER — Other Ambulatory Visit: Payer: Self-pay

## 2021-06-05 DIAGNOSIS — Z4802 Encounter for removal of sutures: Secondary | ICD-10-CM

## 2021-06-05 DIAGNOSIS — S0101XA Laceration without foreign body of scalp, initial encounter: Secondary | ICD-10-CM

## 2021-06-05 DIAGNOSIS — G4700 Insomnia, unspecified: Secondary | ICD-10-CM

## 2021-06-05 MED ORDER — ESZOPICLONE 3 MG PO TABS: 3.0000 mg | ORAL_TABLET | Freq: Every evening | ORAL | 0 refills | Status: DC | PRN

## 2021-06-05 NOTE — ED Triage Notes (Signed)
Pt here for staple removal. 4 staples removed from scalp , skin well approximated. Pt also requesting medication to help sleep, pt states she has anxiety after death of her son,

## 2021-06-08 NOTE — ED Provider Notes (Signed)
  [facility]   [id number] 06/05/21 Arrival Time: [place]:  1. Laceration of scalp, initial encounter   2. Removal of staple   3. Insomnia, unspecified type    Staples removed by RN. Wound healed.  Brief trial of: Meds ordered this encounter  Medications   Eszopiclone 3 MG TABS    Sig: Take 1 tablet (3 mg total) by mouth at bedtime as needed. Take immediately before bedtime    Dispense:  10 tablet    Refill:  0   Needs to schedule f/u with PCP to further evaluate insomnia/anxiety/depression. Voices understanding.  Reviewed expectations re: course of current medical issues. Questions answered. Outlined signs and symptoms indicating need for more acute intervention. Patient verbalized understanding. After Visit Summary given.   SUBJECTIVE:  Misty Howell is a 55 y.o. female who presents for staple removal; scalp. Healing well. Also reports intermittent insomnia since son died. Stress worse recently. Would like something that could help her sleep.  Denies freq alcohol use. No street drug use.  Health Maintenance Due  Topic Date Due   COVID-19 Vaccine (1) Never done   Hepatitis C Screening  Never done   PAP SMEAR-Modifier  Never done   COLONOSCOPY (Pts 45-66yr Insurance coverage will need to be confirmed)  Never done   MAMMOGRAM  09/15/2017   Zoster Vaccines- Shingrix (1 of 2) Never done   TETANUS/TDAP  12/06/2020    OBJECTIVE: General appearance: alert; no distress Skin: healed scalp laceration Psychological: alert and cooperative; normal mood and affect    No Known Allergies  Past Medical History:  Diagnosis Date   Anxiety    [place] ([place]    Giant cell tumor left fifth finger.   Hypercholesterolemia    Overweight(278.02)    Seizures ([facility])    Syncope and collapse    Vision abnormalities    Vitamin D deficiency    Social History   Socioeconomic History   Marital status: Legally Separated    Spouse name: Not on file    Number of children: Not on file   Years of education: Not on file   Highest education level: Not on file  Occupational History   Occupation: Finance  Tobacco Use   Smoking status: Former    Packs/day: 0.00    Years: 3.00    Pack years: 0.00    Types: Cigarettes    Quit date: 01/20/1989    Years since quitting: 32.4   Smokeless tobacco: Never  Vaping Use   Vaping Use: Never used  Substance and Sexual Activity   Alcohol use: Not Currently    Comment: social   Drug use: No   Sexual activity: Not Currently  Other Topics Concern   Not on file  Social History Narrative   [place]  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   Social Determinants of Health   Financial Resource Strain: Not on file  Food Insecurity: Not on file  Transportation Needs: Not on file  Physical Activity: Not on file  Stress: Not on file  Social Connections: Not on file          HVanessa Kick MD 06/08/21 [phone]

## 2021-07-19 ENCOUNTER — Ambulatory Visit: Payer: Medicaid Other | Admitting: Physician Assistant

## 2021-10-18 ENCOUNTER — Encounter: Payer: Self-pay | Admitting: Emergency Medicine

## 2021-10-18 ENCOUNTER — Other Ambulatory Visit: Payer: Self-pay

## 2021-10-18 ENCOUNTER — Ambulatory Visit
Admission: EM | Admit: 2021-10-18 | Discharge: 2021-10-18 | Disposition: A | Discharge status: Home / Self Care | Payer: Self-pay | Attending: Family Medicine | Admitting: Family Medicine

## 2021-10-18 DIAGNOSIS — R8290 Unspecified abnormal findings in urine: Secondary | ICD-10-CM

## 2021-10-18 DIAGNOSIS — F4321 Adjustment disorder with depressed mood: Secondary | ICD-10-CM

## 2021-10-18 DIAGNOSIS — F411 Generalized anxiety disorder: Secondary | ICD-10-CM

## 2021-10-18 LAB — POCT URINALYSIS DIP (MANUAL ENTRY)
Blood, UA: NEGATIVE
Glucose, UA: 250 mg/dL — AB
Nitrite, UA: POSITIVE — AB
Protein Ur, POC: 100 mg/dL — AB
Spec Grav, UA: 1.025 (ref 1.010–1.025)
Urobilinogen, UA: 4 E.U./dL — AB
pH, UA: 5 (ref 5.0–8.0)

## 2021-10-18 MED ORDER — NITROFURANTOIN MONOHYD MACRO 100 MG PO CAPS: 100.0000 mg | ORAL_CAPSULE | Freq: Two times a day (BID) | ORAL | 0 refills | Status: DC

## 2021-10-18 MED ORDER — HYDROXYZINE HCL 25 MG PO TABS: 25.0000 mg | ORAL_TABLET | Freq: Four times a day (QID) | ORAL | 0 refills | Status: DC | PRN

## 2021-10-18 NOTE — ED Triage Notes (Signed)
Patient c/o dysuria x 2 weeks ago.   Patient endorses increased urinary frequency and ABD pressure.   Patient has taken AZO with no  relief of symptoms.   Patient c/o anxiety x "months".   Patient endorses " I've lost my son this year and haven't been able to feel better since".   History of Anxiety.

## 2021-10-18 NOTE — ED Provider Notes (Signed)
[facility]    CSN: [id number] Arrival date & time: 10/18/21  1525      History   Chief Complaint Chief Complaint  Patient presents with   Dysuria   Anxiety    HPI Misty Howell is a 55 y.o. female.   Presenting today with 2-week history of waxing and waning dysuria, urinary frequency, hesitancy and urgency.  Feeling bloating sensation in the lower abdominal area.  Denies fever, chills, hematuria, bowel or bladder incontinence.  Has had urinary tract infections in the past that have felt similar.  Taking Azo as needed with mild temporary relief of symptoms.  Symptoms worsened today at work.  She is also struggling with significant anxiety, grief, insomnia following the murder of her son 10 months ago.  She states she is for the most part doing okay but has random episodes where the grief overcomes her and she struggles with controlling her anxiety.  Requesting an anxiety medication for emergency use in the situations.  Currently between primary care providers as her previous provider retired.  Denies suicidal or homicidal ideation.   Past Medical History:  Diagnosis Date   Anxiety    Cancer ([place])    Giant cell tumor left fifth finger.   Hypercholesterolemia    Overweight(278.02)    Seizures ([facility])    Syncope and collapse    Vision abnormalities    Vitamin D deficiency     Patient Active Problem List   Diagnosis Date Noted   Hyperglycemia 08/08/2017   Hypokalemia 08/07/2017   Seizure ([place]) 11/24/2013   Collapse 11/24/2013   Vitamin D deficiency 07/06/2010   Hyperlipidemia 07/05/2010   OVERWEIGHT 03/31/2008   Anxiety state 03/30/2008    Past Surgical History:  Procedure Laterality Date   CESAREAN SECTION     FINGER SURGERY     due to giant cell tumor, hip bone placed in finger    OB History   No obstetric history on file.      Home Medications    Prior to Admission medications   Medication Sig Start Date End Date Taking? Authorizing Provider   nitrofurantoin, macrocrystal-monohydrate, (MACROBID) 100 MG capsule Take 1 capsule (100 mg total) by mouth 2 (two) times daily. 10/18/21  Yes Volney American, PA-C  Eszopiclone 3 MG TABS Take 1 tablet (3 mg total) by mouth at bedtime as needed. Take immediately before bedtime 06/05/21   Vanessa Kick, MD  HYDROcodone-acetaminophen (NORCO/VICODIN) 5-325 MG tablet Take 1 tablet by mouth every 6 (six) hours as needed for severe pain. 10/01/20   Joy, Shawn C, PA-C  hydrOXYzine (ATARAX) 25 MG tablet Take 1 tablet (25 mg total) by mouth every 6 (six) hours as needed for anxiety. 10/18/21   Volney American, PA-C  ibuprofen (ADVIL) 600 MG tablet Take 1 tablet (600 mg total) by mouth every 6 (six) hours as needed. 10/01/20   Joy, Shawn C, PA-C  levETIRAcetam (KEPPRA) 500 MG tablet Take 1 tablet (500 mg total) by mouth 2 (two) times daily. 05/25/21   Milton Ferguson, MD  lidocaine (LIDODERM) 5 % Place 1 patch onto the skin daily. Remove & Discard patch within 12 hours or as directed by MD 10/01/20   Joy, Helane Gunther, PA-C  methocarbamol (ROBAXIN) 750 MG tablet Take 1 tablet (750 mg total) by mouth 2 (two) times daily as needed for muscle spasms (or muscle tightness). 10/01/20   Joy, Shawn C, PA-C  ondansetron (ZOFRAN ODT) 4 MG disintegrating tablet Take 1 tablet (4 mg  total) by mouth every 8 (eight) hours as needed for nausea or vomiting. 10/01/20   Joy, Helane Gunther, PA-C    Family History Family History  Problem Relation Age of Onset   Hypertension Mother    Migraines Mother    Hypertension Father     Social History Social History   Tobacco Use   Smoking status: Former    Packs/day: 0.00    Years: 3.00    Pack years: 0.00    Types: Cigarettes    Quit date: 01/20/1989    Years since quitting: 32.7   Smokeless tobacco: Never  Vaping Use   Vaping Use: Never used  Substance Use Topics   Alcohol use: Not Currently    Comment: social   Drug use: No     Allergies   Patient has no known  allergies.   Review of Systems Review of Systems Per HPI  Physical Exam Triage Vital Signs ED Triage Vitals  Enc Vitals Group     BP 10/18/21 1658 114/86     Pulse Rate 10/18/21 1658 (!) 118     Resp 10/18/21 1658 16     Temp 10/18/21 1658 98.1 F (36.7 C)     Temp Source 10/18/21 1658 Oral     SpO2 10/18/21 1658 93 %     Weight --      Height --      Head Circumference --      Peak Flow --      Pain Score 10/18/21 1702 0     Pain Loc --      Pain Edu? --      Excl. in [place]? --    No data found.  Updated Vital Signs BP 114/86 (BP Location: Right Arm)   Pulse (!) 118   Temp 98.1 F (36.7 C) (Oral)   Resp 16   LMP  (LMP Unknown)   SpO2 93%   Visual Acuity Right Eye Distance:   Left Eye Distance:   Bilateral Distance:    Right Eye Near:   Left Eye Near:    Bilateral Near:     Physical Exam Vitals and nursing note reviewed.  Constitutional:      Appearance: Normal appearance. She is not ill-appearing.  HENT:     Head: Atraumatic.  Eyes:     Extraocular Movements: Extraocular movements intact.     Conjunctiva/sclera: Conjunctivae normal.  Cardiovascular:     Rate and Rhythm: Normal rate and regular rhythm.     Heart sounds: Normal heart sounds.  Pulmonary:     Effort: Pulmonary effort is normal.     Breath sounds: Normal breath sounds.  Abdominal:     General: Bowel sounds are normal. There is no distension.     Palpations: Abdomen is soft.     Tenderness: There is no abdominal tenderness. There is no right CVA tenderness, left CVA tenderness or guarding.  Musculoskeletal:        General: Normal range of motion.     Cervical back: Normal range of motion and neck supple.  Skin:    General: Skin is warm and dry.  Neurological:     Mental Status: She is alert and oriented to person, place, and time.  Psychiatric:        Thought Content: Thought content normal.        Judgment: Judgment normal.     Comments: Tearful, anxious     UC Treatments /  Results  Labs (all labs ordered  are listed, but only abnormal results are displayed) Labs Reviewed  POCT URINALYSIS DIP (MANUAL ENTRY) - Abnormal; Notable for the following components:      Result Value   Color, UA orange (*)    Glucose, UA =250 (*)    Bilirubin, UA moderate (*)    Ketones, POC UA trace (5) (*)    Protein Ur, POC =100 (*)    Urobilinogen, UA 4.0 (*)    Nitrite, UA Positive (*)    Leukocytes, UA Large (3+) (*)    All other components within normal limits  URINE CULTURE    EKG   Radiology No results found.  Procedures Procedures (including critical care time)  Medications Ordered in UC Medications - No data to display  Initial Impression / Assessment and Plan / UC Course  I have reviewed the triage vital signs and the nursing notes.  Pertinent labs & imaging results that were available during my care of the patient were reviewed by me and considered in my medical decision making (see chart for details).      Tachycardic in triage, otherwise vital signs reassuring.  Exam overall reassuring with no significant abnormal findings.  Urinalysis unreliable due to Azo use given symptoms and history of urinary tract infections will cover for UTI with Macrobid while awaiting urine culture results for confirmation.  Discussed push fluids, avoid bladder irritants and follow-up with worsening symptoms.  We will treat anxiety with hydroxyzine and give resources for primary care so she can establish care.  Discussed behavioral health resources additionally.  Return for worsening symptoms.  Final Clinical Impressions(s) / UC Diagnoses   Final diagnoses:  Abnormal urinalysis  Anxiety state  Grief   Discharge Instructions   None    ED Prescriptions     Medication Sig Dispense Auth. Provider   hydrOXYzine (ATARAX) 25 MG tablet Take 1 tablet (25 mg total) by mouth every 6 (six) hours as needed for anxiety. 30 tablet Volney American, PA-C   nitrofurantoin,  macrocrystal-monohydrate, (MACROBID) 100 MG capsule Take 1 capsule (100 mg total) by mouth 2 (two) times daily. 10 capsule Volney American, [place]      PDMP not reviewed this encounter.   Volney American, [place] 10/18/21 1745

## 2021-10-20 LAB — URINE CULTURE: Culture: <10000 — AB

## 2021-11-18 ENCOUNTER — Other Ambulatory Visit: Payer: Self-pay | Admitting: Family Medicine

## 2021-11-18 NOTE — Telephone Encounter (Signed)
Notes to clinic:  prescriber not in this practice, please assess.  Requested Prescriptions  Pending Prescriptions Disp Refills   nitrofurantoin, macrocrystal-monohydrate, (MACROBID) 100 MG capsule [Pharmacy Med Name: NITROFURANTOIN M/M 100MG ] 10 capsule 0    Sig: Take 1 capsule (100 mg total) by mouth 2 (two) times daily.     Off-Protocol Failed - 11/18/2021 10:53 AM      Failed - Medication not assigned to a protocol, review manually.      Failed - Valid encounter within last 12 months    Recent Outpatient Visits   None

## 2022-01-02 ENCOUNTER — Emergency Department (HOSPITAL_COMMUNITY)
Admission: EM | Admit: 2022-01-02 | Discharge: 2022-01-02 | Disposition: A | Discharge status: Home / Self Care | Payer: Medicaid Other | Attending: Emergency Medicine | Admitting: Emergency Medicine

## 2022-01-02 ENCOUNTER — Encounter (HOSPITAL_COMMUNITY): Payer: Self-pay | Admitting: Emergency Medicine

## 2022-01-02 DIAGNOSIS — G4710 Hypersomnia, unspecified: Secondary | ICD-10-CM | POA: Insufficient documentation
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
## 2022-01-02 NOTE — ED Notes (Signed)
Pt discharged, pt not wanting to leave. Security and [employer] called.

## 2022-01-02 NOTE — ED Provider Notes (Signed)
[facility] EMERGENCY DEPARTMENT Provider Note   CSN: [id number] Arrival date & time: 01/02/22  0140     History  Chief Complaint  Patient presents with   Medical Clearance    Misty Howell is a 56 y.o. female.  I approcached the patient multiple times to try and talk to her and she refused to. She stated that she didn't feel good then would roll over and go to sleep. Denied SI/HI to nursing. I watched her ambulate to and from the bathroom without difficulty and then would pretend to be asleep and not talk to me again.        Home Medications Prior to Admission medications   Medication Sig Start Date End Date Taking? Authorizing Provider  Eszopiclone 3 MG TABS Take 1 tablet (3 mg total) by mouth at bedtime as needed. Take immediately before bedtime 06/05/21   Vanessa Kick, MD  HYDROcodone-acetaminophen (NORCO/VICODIN) 5-325 MG tablet Take 1 tablet by mouth every 6 (six) hours as needed for severe pain. 10/01/20   Joy, Shawn C, PA-C  hydrOXYzine (ATARAX) 25 MG tablet Take 1 tablet (25 mg total) by mouth every 6 (six) hours as needed for anxiety. 10/18/21   Volney American, PA-C  ibuprofen (ADVIL) 600 MG tablet Take 1 tablet (600 mg total) by mouth every 6 (six) hours as needed. 10/01/20   Joy, Shawn C, PA-C  levETIRAcetam (KEPPRA) 500 MG tablet Take 1 tablet (500 mg total) by mouth 2 (two) times daily. 05/25/21   Milton Ferguson, MD  lidocaine (LIDODERM) 5 % Place 1 patch onto the skin daily. Remove & Discard patch within 12 hours or as directed by MD 10/01/20   Joy, Helane Gunther, PA-C  methocarbamol (ROBAXIN) 750 MG tablet Take 1 tablet (750 mg total) by mouth 2 (two) times daily as needed for muscle spasms (or muscle tightness). 10/01/20   Joy, Shawn C, PA-C  nitrofurantoin, macrocrystal-monohydrate, (MACROBID) 100 MG capsule Take 1 capsule (100 mg total) by mouth 2 (two) times daily. 10/18/21   Volney American, PA-C  ondansetron (ZOFRAN ODT) 4 MG disintegrating tablet Take  1 tablet (4 mg total) by mouth every 8 (eight) hours as needed for nausea or vomiting. 10/01/20   Joy, Helane Gunther, PA-C      Allergies    Patient has no known allergies.    Review of Systems   Review of Systems  Physical Exam Updated Vital Signs BP (!) 172/98 (BP Location: Right Arm)    Pulse (!) 105    Temp 97.7 F (36.5 C) (Oral)    Resp 18    Ht 5\' 4"  (1.626 m)    Wt 89.8 kg    SpO2 100%    BMI 33.98 kg/m  Physical Exam Vitals and nursing note reviewed.  Constitutional:      Appearance: She is well-developed.  HENT:     Head: Normocephalic and atraumatic.     Mouth/Throat:     Mouth: Mucous membranes are moist.     Pharynx: Oropharynx is clear.  Cardiovascular:     Rate and Rhythm: Normal rate and regular rhythm.  Pulmonary:     Effort: No respiratory distress.     Breath sounds: No stridor.  Abdominal:     General: There is no distension.  Musculoskeletal:        General: No swelling or tenderness. Normal range of motion.     Cervical back: Normal range of motion.  Skin:    General: Skin is warm  and dry.  Neurological:     General: No focal deficit present.     Mental Status: She is alert.    ED Results / Procedures / Treatments   Labs (all labs ordered are listed, but only abnormal results are displayed) Labs Reviewed - No data to display  EKG None  Radiology No results found.  Procedures Procedures    Medications Ordered in ED Medications - No data to display  ED Course/ Medical Decision Making/ A&P                           Medical Decision Making  Difficult to fully assess as she won't cooperate with an interview. As evidenced by her ability to consciously ignore me, ambulate, talk to nursing I don't feel she has a significant neurologic or metabolic abnormality causing her problem. I attempted on 3 occasions to interview her without success. She was subsequently discharged.   Final Clinical Impression(s) / ED Diagnoses Final diagnoses:   Sleeping excessive    Rx / DC Orders ED Discharge Orders     None         Cintya Daughety, Corene Cornea, MD 01/03/22 [phone]

## 2022-01-03 ENCOUNTER — Emergency Department (HOSPITAL_COMMUNITY)
Admission: EM | Admit: 2022-01-03 | Discharge: 2022-01-06 | Disposition: A | Discharge status: Other Acute Inpatient Hospital | Payer: Medicaid Other | Attending: Emergency Medicine | Admitting: Emergency Medicine

## 2022-01-03 ENCOUNTER — Other Ambulatory Visit: Payer: Self-pay

## 2022-01-03 ENCOUNTER — Encounter (HOSPITAL_COMMUNITY): Payer: Self-pay

## 2022-01-03 DIAGNOSIS — Z20822 Contact with and (suspected) exposure to covid-19: Secondary | ICD-10-CM | POA: Insufficient documentation

## 2022-01-03 DIAGNOSIS — E876 Hypokalemia: Secondary | ICD-10-CM | POA: Insufficient documentation

## 2022-01-03 DIAGNOSIS — Z87891 Personal history of nicotine dependence: Secondary | ICD-10-CM | POA: Insufficient documentation

## 2022-01-03 DIAGNOSIS — F99 Mental disorder, not otherwise specified: Secondary | ICD-10-CM

## 2022-01-03 DIAGNOSIS — F29 Unspecified psychosis not due to a substance or known physiological condition: Secondary | ICD-10-CM | POA: Insufficient documentation

## 2022-01-03 DIAGNOSIS — F23 Brief psychotic disorder: Secondary | ICD-10-CM | POA: Diagnosis present

## 2022-01-03 DIAGNOSIS — R7309 Other abnormal glucose: Secondary | ICD-10-CM | POA: Insufficient documentation

## 2022-01-03 LAB — CBC WITH DIFFERENTIAL/PLATELET
Abs Immature Granulocytes: 0.02 10*3/uL (ref 0.00–0.07)
Basophils Absolute: 0 10*3/uL (ref 0.0–0.1)
Basophils Relative: 0 %
Eosinophils Absolute: 0 10*3/uL (ref 0.0–0.5)
Eosinophils Relative: 0 %
HCT: 43.7 % (ref 36.0–46.0)
Hemoglobin: 14.5 g/dL (ref 12.0–15.0)
Immature Granulocytes: 0 %
Lymphocytes Relative: 25 %
Lymphs Abs: 2 10*3/uL (ref 0.7–4.0)
MCH: 29.5 pg (ref 26.0–34.0)
MCHC: 33.2 g/dL (ref 30.0–36.0)
MCV: 88.8 fL (ref 80.0–100.0)
Monocytes Absolute: 0.7 10*3/uL (ref 0.1–1.0)
Monocytes Relative: 8 %
Neutro Abs: 5.4 10*3/uL (ref 1.7–7.7)
Neutrophils Relative %: 67 %
Platelets: 275 10*3/uL (ref 150–400)
RBC: 4.92 MIL/uL (ref 3.87–5.11)
RDW: 12.4 % (ref 11.5–15.5)
WBC: 8.1 10*3/uL (ref 4.0–10.5)
nRBC: 0 % (ref 0.0–0.2)

## 2022-01-03 LAB — URINALYSIS, ROUTINE W REFLEX MICROSCOPIC
Bilirubin Urine: NEGATIVE
Glucose, UA: NEGATIVE mg/dL
Hgb urine dipstick: NEGATIVE
Ketones, ur: 15 mg/dL — AB
Leukocytes,Ua: NEGATIVE
Nitrite: NEGATIVE
Protein, ur: NEGATIVE mg/dL
Specific Gravity, Urine: 1.025 (ref 1.005–1.030)
pH: 6 (ref 5.0–8.0)

## 2022-01-03 LAB — BASIC METABOLIC PANEL
Anion gap: 13 (ref 5–15)
BUN: 8 mg/dL (ref 6–20)
CO2: 26 mmol/L (ref 22–32)
Calcium: 9.1 mg/dL (ref 8.9–10.3)
Chloride: 99 mmol/L (ref 98–111)
Creatinine, Ser: 0.73 mg/dL (ref 0.44–1.00)
GFR, Estimated: >60 mL/min (ref >60)
Glucose, Bld: 136 mg/dL — ABNORMAL HIGH (ref 70–99)
Potassium: 2.7 mmol/L — CL (ref 3.5–5.1)
Sodium: 138 mmol/L (ref 135–145)

## 2022-01-03 LAB — ETHANOL: Alcohol, Ethyl (B): <10 mg/dL (ref <10)

## 2022-01-03 LAB — RAPID URINE DRUG SCREEN, HOSP PERFORMED
Amphetamines: NOT DETECTED
Barbiturates: NOT DETECTED
Benzodiazepines: NOT DETECTED
Cocaine: NOT DETECTED
Opiates: NOT DETECTED
Tetrahydrocannabinol: POSITIVE — AB

## 2022-01-03 MED ORDER — HYDROXYZINE HCL 25 MG PO TABS
25.0000 mg | ORAL_TABLET | Freq: Three times a day (TID) | ORAL | Status: DC | PRN
  Filled 2022-01-03: qty 1

## 2022-01-03 MED ORDER — LEVETIRACETAM IN NACL 500 MG/100ML IV SOLN: 500.0000 mg | Freq: Two times a day (BID) | INTRAVENOUS | Status: DC

## 2022-01-03 MED ORDER — POTASSIUM CHLORIDE CRYS ER 20 MEQ PO TBCR
40.0000 meq | EXTENDED_RELEASE_TABLET | Freq: Once | ORAL | Status: AC
  Administered 2022-01-03: 40 meq via ORAL
  Filled 2022-01-03: qty 2

## 2022-01-03 MED ORDER — LORAZEPAM 0.5 MG PO TABS
0.5000 mg | ORAL_TABLET | Freq: Once | ORAL | Status: AC
  Administered 2022-01-03: 0.5 mg via ORAL
  Filled 2022-01-03: qty 1

## 2022-01-03 MED ORDER — HYDROXYZINE HCL 25 MG PO TABS
50.0000 mg | ORAL_TABLET | Freq: Once | ORAL | Status: AC
  Administered 2022-01-03: 50 mg via ORAL
  Filled 2022-01-03: qty 2

## 2022-01-03 MED ORDER — POTASSIUM CHLORIDE CRYS ER 20 MEQ PO TBCR
20.0000 meq | EXTENDED_RELEASE_TABLET | Freq: Every day | ORAL | Status: DC
  Filled 2022-01-03 (×2): qty 1

## 2022-01-03 NOTE — ED Notes (Signed)
ED Provider at bedside. 

## 2022-01-03 NOTE — ED Notes (Signed)
Pt belongings (clothes and shoes) put into belongings bag and bag placed into locker room with patient sticker.

## 2022-01-03 NOTE — ED Provider Notes (Signed)
[facility] EMERGENCY DEPARTMENT Provider Note   CSN: [id number] Arrival date & time: 01/03/22  1459     History  Chief Complaint  Patient presents with   Psychiatric Evaluation    Misty Howell is a 56 y.o. female presented emergency department or IVC by police with concern for erratic behavior and concern for mental health crisis.  The patient just tells me over and over that she "needs somewhere to relax from all the he said she said, all the stress in the house".  She reports she has not slept well in 4 to 5 days.  She reports that she has been out of her medications, and request for something to help her relax.  Of note, the patient was seen in the emergency room last night by the overnight provider, had refused to interact or talk to the provider, was medically cleared and discharged.  Today she presented IVC by police, who report concerns that the patient has been under stress since the recent loss of her son, but she lost her phone is behaving erratically in the house.  Medical chart review, the patient was evaluated for psychotic disorder in August 2020, was recommended for inpatient treatment at the time  HPI     Home Medications Prior to Admission medications   Medication Sig Start Date End Date Taking? Authorizing Provider  Eszopiclone 3 MG TABS Take 1 tablet (3 mg total) by mouth at bedtime as needed. Take immediately before bedtime 06/05/21   Vanessa Kick, MD  HYDROcodone-acetaminophen (NORCO/VICODIN) 5-325 MG tablet Take 1 tablet by mouth every 6 (six) hours as needed for severe pain. 10/01/20   Joy, Shawn C, PA-C  hydrOXYzine (ATARAX) 25 MG tablet Take 1 tablet (25 mg total) by mouth every 6 (six) hours as needed for anxiety. 10/18/21   Volney American, PA-C  ibuprofen (ADVIL) 600 MG tablet Take 1 tablet (600 mg total) by mouth every 6 (six) hours as needed. 10/01/20   Joy, Shawn C, PA-C  levETIRAcetam (KEPPRA) 500 MG tablet Take 1 tablet (500 mg total) by  mouth 2 (two) times daily. 05/25/21   Milton Ferguson, MD  lidocaine (LIDODERM) 5 % Place 1 patch onto the skin daily. Remove & Discard patch within 12 hours or as directed by MD 10/01/20   Joy, Helane Gunther, PA-C  methocarbamol (ROBAXIN) 750 MG tablet Take 1 tablet (750 mg total) by mouth 2 (two) times daily as needed for muscle spasms (or muscle tightness). 10/01/20   Joy, Shawn C, PA-C  nitrofurantoin, macrocrystal-monohydrate, (MACROBID) 100 MG capsule Take 1 capsule (100 mg total) by mouth 2 (two) times daily. 10/18/21   Volney American, PA-C  ondansetron (ZOFRAN ODT) 4 MG disintegrating tablet Take 1 tablet (4 mg total) by mouth every 8 (eight) hours as needed for nausea or vomiting. 10/01/20   Joy, Helane Gunther, PA-C      Allergies    Patient has no known allergies.    Review of Systems   Review of Systems  Physical Exam Updated Vital Signs BP (!) 157/130 (BP Location: Right Arm)    Pulse (!) 110    Temp 97.9 F (36.6 C) (Oral)    Resp 17    Ht 5\' 4"  (1.626 m)    Wt 77.1 kg    SpO2 97%    BMI 29.18 kg/m  Physical Exam Constitutional:      General: She is not in acute distress.    Comments: Tearful, rapid speech  HENT:  Head: Normocephalic and atraumatic.  Eyes:     Conjunctiva/sclera: Conjunctivae normal.     Pupils: Pupils are equal, round, and reactive to light.  Cardiovascular:     Rate and Rhythm: Normal rate and regular rhythm.  Pulmonary:     Effort: Pulmonary effort is normal. No respiratory distress.  Abdominal:     General: There is no distension.     Tenderness: There is no abdominal tenderness.  Skin:    General: Skin is warm and dry.  Neurological:     General: No focal deficit present.     Mental Status: She is alert. Mental status is at baseline.    ED Results / Procedures / Treatments   Labs (all labs ordered are listed, but only abnormal results are displayed) Labs Reviewed  URINALYSIS, ROUTINE W REFLEX MICROSCOPIC - Abnormal; Notable for the following  components:      Result Value   Ketones, ur 15 (*)    All other components within normal limits  RAPID URINE DRUG SCREEN, HOSP PERFORMED - Abnormal; Notable for the following components:   Tetrahydrocannabinol POSITIVE (*)    All other components within normal limits  BASIC METABOLIC PANEL - Abnormal; Notable for the following components:   Potassium 2.7 (*)    Glucose, Bld 136 (*)    All other components within normal limits  CBC WITH DIFFERENTIAL/PLATELET  ETHANOL    EKG None  Radiology No results found.  Procedures Procedures    Medications Ordered in ED Medications  hydrOXYzine (ATARAX) tablet 50 mg (50 mg Oral Given 01/03/22 1659)  LORazepam (ATIVAN) tablet 0.5 mg (0.5 mg Oral Given 01/03/22 1659)  potassium chloride SA (KLOR-CON M) CR tablet 40 mEq (40 mEq Oral Given 01/03/22 1659)    ED Course/ Medical Decision Making/ A&P Clinical Course as of 01/03/22 2310  Fri Jan 03, 2022  1717 Potassium(!!): 2.7 [MT]  1717 Oral K ordered [MT]    Clinical Course User Index [MT] Wyvonnia Dusky, MD                           Medical Decision Making Amount and/or Complexity of Data Reviewed Labs: ordered. Decision-making details documented in ED Course.  Risk Prescription drug management.   This patient presents to the Emergency Department with complaint of psychiatric disturbance. This involves an extensive number of treatment options, and is a complaint that carries with it a high risk of complications and morbidity.   I ordered, reviewed, and interpreted labs, including BMP and CBC.  There were no immediate, life-threatening emergencies found in this labwork.  The patient was medically cleared for TTS and psychiatric evaluation.  At this time, the patient is under IVC.  First provider exam filed I ordered medication pral K for hypokalemia  Previous records obtained and reviewed showing Aug 2020 psych evaluation   TTS eval recommending inpatient psychiatric  hospitalization at this time  K ordered, meds for anxiety          Final Clinical Impression(s) / ED Diagnoses Final diagnoses:  Psychiatric complaint    Rx / DC Orders ED Discharge Orders     None         Wyvonnia Dusky, MD 01/03/22 2310

## 2022-01-03 NOTE — ED Notes (Signed)
Date and time results received: 01/03/22 1650 (use smartphrase ".now" to insert current time)  Test: K Critical Value: 2.7  Name of Provider Notified: Trifan  Orders Received? Or Actions Taken?: na
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
## 2022-01-03 NOTE — ED Notes (Addendum)
Pt keeps stating that people are coming at her and she wants to "move to a different level." She has said this multiple times and keeps asking to speak with a ER nurse and she has been in her room multiple times.

## 2022-01-03 NOTE — ED Notes (Signed)
Pt ambulatory to bathroom. Pt asked to use phone, pt aware of time and that it is past approved time to use phones. RN to bedside

## 2022-01-03 NOTE — ED Notes (Signed)
Patient asking for current time and date.  Would like to make a call to her mother.  Patient informed that she may use the phone after 10 AM in the morning.  Understanding voiced

## 2022-01-03 NOTE — BH Assessment (Addendum)
Comprehensive Clinical Assessment (CCA) Note  01/03/2022 Misty Howell [id number]  DISPOSITION: Gave clinical report to Jinny Blossom, NP who determined Pt meets criteria for inpatient psychiatric treatment. Clayborne Dana, [facility], confirmed adult unit is at capacity. Notified Dr Octaviano Glow and Lelon Frohlich, RN of recommendation via secure message.  The patient demonstrates the following risk factors for suicide: Chronic risk factors for suicide include: psychiatric disorder of psychotic disorder and medical illness seizure disorder . Acute risk factors for suicide include: loss (financial, interpersonal, professional). Protective factors for this patient include: positive social support. Considering these factors, the overall suicide risk at this point appears to be low. Patient is not appropriate for outpatient follow up due to psychotic symptoms.  [place] ED from 01/03/2022 in [place] ED from 01/02/2022 in [place] ED from 10/18/2021 in [facility] [place] No Risk No Risk No Risk      Pt is a 56 year old female who presents unaccompanied to [facility] ED via law enforcement after being petitioned for involuntary commitment by her mother, Misty Howell [phone]. Affidavit and petition states: "The respondent was admitted for mental health treatment two years ago and has been off mental health medication for some time now. She post her son last year in February due to an overdose and is having a hard time dealing with it. She thinks that people are coming into the home that she shares with her mother and taking pictures of them. She was taken to the hospital early yesterday morning by [facility] police but she refused treatment. She has bagged all of her belongings and placed them beside of her bed because her friends on Facebook are talking about her. She took tags off her vehicle and parked  it in her back yard. She also threw her phone away. Based on these facts, she needs to be evaluated."  Pt is a poor historian due to her current mental status. She is unable to recall her name or date of birth. She does not know when she is, guessing that she is in [place]. She does not know why she is in the hospital, stating she may of have a seizure. Pt is unable to describes her mood. She says she does not know if she is having suicidal thoughts. She denies homicidal ideation. She states she is uncertain whether she is experiencing hallucinations. She denies alcohol or other substance use and urine drugs screen is positive for cannabis. Pt says she lives alone but per IVC she lives with her mother.   TTS attempted to contact Pt's mother/petitioner, Misty Howell, at [phone] and repeated calls resulted in busy signal. Pt's medical record indicates Pt has a history of psychotic episodes with paranoia and delusional thought content and was psychiatrically hospitalized at [facility] in August 2020.  Pt is dressed in hospital scrubs and appears slightly disheveled. Pt speaks in a clear tone, at moderate volume and normal pace. Motor behavior appears normal. Eye contact is good. Pt's mood is anxious and affect is congruent with mood. Thought process is coherent. Pt's insight and judgment are impaired.   Chief Complaint:  Chief Complaint  Patient presents with   Psychiatric Evaluation   Visit Diagnosis: F29 Unspecified psychotic disorder   CCA Screening, Triage and Referral (STR)  Patient Reported Information How did you hear about [place]? 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 No data recorded Practice/Facility Name: No data recorded Practice/Facility Phone Number: No data recorded Name of Contact: No data recorded Contact Number: No data recorded Contact Fax  Number: No data recorded Prescriber Name: No data recorded Prescriber Address (if known): No data recorded  What Is the Reason for Your Visit/Call Today? Pt petitioned for IVC by her mother, Misty Howell, who reports Pt has a history of mental illness and is grieving the loss of her son. Ms Southern reports Pt has appeared paranoid, delusional, and engaging in bizarre behavior.  How Long Has This Been Causing You Problems? <Week  What Do You Feel Would Help You the Most Today? Treatment for Depression or other mood problem   Have You Recently Been in Any Inpatient Treatment (Hospital/Detox/Crisis Center/28-Day Program)? No data recorded Name/Location of Program/Hospital:No data recorded How Long Were You There? No data recorded When Were You Discharged? No data recorded  Have You Ever Received Services From [facility] Before? No data recorded Who Do You See at [facility]? No data recorded  Have You Recently Had Any Thoughts About Hurting Yourself? No  Are You Planning to Commit Suicide/Harm Yourself At This time? No   Have you Recently Had Thoughts About [place]? No  Explanation: No data recorded  Have You Used Any Alcohol or Drugs in the Past 24 Hours? No  How Long Ago Did You Use Drugs or Alcohol? No data recorded What Did You Use and How Much? No data recorded  Do You Currently Have a Therapist/Psychiatrist? No  Name of Therapist/Psychiatrist: No data recorded  Have You Been Recently Discharged From Any Office Practice or Programs? No  Explanation of Discharge From Practice/Program: No data recorded    CCA Screening Triage Referral Assessment Type of Contact: Tele-Assessment  Is this Initial or Reassessment? Initial Assessment  Date Telepsych consult ordered in [facility]:  01/03/22  Time Telepsych consult ordered in [facility]:  1721   Patient Reported Information Reviewed? No data recorded Patient Left Without Being Seen? No data recorded Reason for Not  Completing Assessment: No data recorded  Collateral Involvement: Mother: Misty Howell [phone]   Does Patient Have a Court Appointed Legal Guardian? No data recorded Name and Contact of Legal Guardian: No data recorded If Minor and Not Living with Parent(s), Who has Custody? NA  Is CPS involved or ever been involved? -- (Pt unable to answer due to AMS - Pt not oriented.)  Is APS involved or ever been involved? -- (Pt unable to answer due to AMS - Pt not oriented.)   Patient Determined To Be At Risk for Harm To Self or Others Based on Review of Patient Reported Information or Presenting Complaint? Yes, for Self-Harm  Method: No data recorded Availability of Means: No data recorded Intent: No data recorded Notification Required: No data recorded Additional Information for Danger to Others Potential: No data recorded Additional Comments for Danger to Others Potential: No data recorded Are There Guns or Other Weapons in Your Home? No data recorded Types of Guns/Weapons: No data recorded Are These Weapons Safely Secured?                            No data recorded Who Could Verify You Are Able To Have These Secured: No data recorded Do You Have any Outstanding Charges, Pending Court Dates, Parole/Probation? No data recorded Contacted To Inform of Risk  of Harm To Self or Others: Family/Significant Other:   Location of Assessment: [facility]   Does Patient Present under Involuntary Commitment? Yes  IVC Papers Initial File Date: 01/03/22   [place] of Residence: [place]   Patient Currently Receiving the Following Services: Medication Management   Determination of Need: Emergent (2 hours)   Options For Referral: Inpatient Hospitalization     CCA Biopsychosocial Intake/Chief Complaint:  No data recorded Current Symptoms/Problems: No data recorded  Patient Reported Schizophrenia/Schizoaffective Diagnosis in Past: No   Strengths: Pt unable to answer due to AMS - Pt  not oriented.  Preferences: No data recorded Abilities: No data recorded  Type of Services Patient Feels are Needed: No data recorded  Initial Clinical Notes/Concerns: No data recorded  Mental Health Symptoms Depression:   Change in energy/activity; Difficulty Concentrating; Sleep (too much or little)   Duration of Depressive symptoms:  Less than two weeks   Mania:   Change in energy/activity   Anxiety:    Difficulty concentrating; Sleep; Tension; Worrying   Psychosis:   Delusions; Other negative symptoms   Duration of Psychotic symptoms:  Less than six months   Trauma:   -- (Pt unable to answer due to AMS - Pt not oriented.)   Obsessions:   -- (Pt unable to answer due to AMS - Pt not oriented.)   Compulsions:   -- (Pt unable to answer due to AMS - Pt not oriented.)   Inattention:   -- (Pt unable to answer due to AMS - Pt not oriented.)   Hyperactivity/Impulsivity:   -- (Pt unable to answer due to AMS - Pt not oriented.)   Oppositional/Defiant Behaviors:   -- (Pt unable to answer due to AMS - Pt not oriented.)   Emotional Irregularity:   -- (Pt unable to answer due to AMS - Pt not oriented.)   Other Mood/Personality Symptoms:   NA    Mental Status Exam Appearance and self-care  Stature:   Average   Weight:   Average weight   Clothing:   -- (Scrubs)   Grooming:   Normal   Cosmetic use:   None   Posture/gait:   Normal   Motor activity:   Not Remarkable   Sensorium  Attention:   Confused   Concentration:   -- (Poor)   Orientation:   -- (Not oriented to person, place, time, or situation.)   Recall/memory:  No data recorded  Affect and Mood  Affect:   Anxious   Mood:   Anxious   Relating  Eye contact:   Normal   Facial expression:   Anxious   Attitude toward examiner:   Cooperative   Thought and Language  Speech flow:  Normal   Thought content:   Delusions   Preoccupation:   -- (Pt unable to answer due to AMS -  Pt not oriented.)   Hallucinations:   -- (Pt unable to answer due to AMS - Pt not oriented.)   Organization:  No data recorded  [employer] of Knowledge:   Fair   Intelligence:   Average   Abstraction:   Normal   Judgement:   Poor   Reality Testing:   Distorted   Insight:   Poor   Decision Making:   Confused   Social Functioning  Social Maturity:   Isolates   Social Judgement:   Normal   Stress  Stressors:   Grief/losses; Illness   Coping Ability:   Overwhelmed   Skill Deficits:  None   Supports:   Family     Religion: Religion/Spirituality Are You A Religious Person?:  (Pt unable to answer due to AMS - Pt not oriented.)  Leisure/Recreation: Leisure / Recreation Do You Have Hobbies?:  (Pt unable to answer due to AMS - Pt not oriented.)  Exercise/Diet: Exercise/Diet Do You Exercise?:  (Pt unable to answer due to AMS - Pt not oriented.) Have You Gained or Lost A Significant Amount of Weight in the Past Six Months?:  (Pt unable to answer due to AMS - Pt not oriented.) Do You Follow a Special Diet?:  (Pt unable to answer due to AMS - Pt not oriented.) Do You Have Any Trouble Sleeping?: Yes Explanation of Sleeping Difficulties: Pt says she has not been sleeping   CCA Employment/Education Employment/Work Situation: Employment / Work Technical sales engineer:  (Pt unable to answer due to AMS - Pt not oriented.) Patient's Job has Been Impacted by Current Illness:  (Pt unable to answer due to AMS - Pt not oriented.) Has Patient ever Been in the [employer]?:  (Pt unable to answer due to AMS - Pt not oriented.)  Education: Education Is Patient Currently Attending School?:  (Pt unable to answer due to AMS - Pt not oriented.) Last Grade Completed:  (Pt unable to answer due to AMS - Pt not oriented.) Did You Attend College?:  (Pt unable to answer due to AMS - Pt not oriented.) Did You Have An Individualized Education Program (IIEP):   (Pt unable to answer due to AMS - Pt not oriented.) Did You Have Any Difficulty At School?:  (Pt unable to answer due to AMS - Pt not oriented.) Patient's Education Has Been Impacted by Current Illness:  (Pt unable to answer due to AMS - Pt not oriented.)   CCA Family/Childhood History Family and Relationship History: Family history Marital status: Single Does patient have children?:  (Pt unable to answer due to AMS - Pt not oriented.)  Childhood History:  Childhood History By whom was/is the patient raised?:  (Pt unable to answer due to AMS - Pt not oriented.) Did patient suffer any verbal/emotional/physical/sexual abuse as a child?:  (Pt unable to answer due to AMS - Pt not oriented.) Did patient suffer from severe childhood neglect?:  (Pt unable to answer due to AMS - Pt not oriented.) Has patient ever been sexually abused/assaulted/raped as an adolescent or adult?:  (Pt unable to answer due to AMS - Pt not oriented.) Was the patient ever a victim of a crime or a disaster?:  (Pt unable to answer due to AMS - Pt not oriented.) Witnessed domestic violence?:  (Pt unable to answer due to AMS - Pt not oriented.) Has patient been affected by domestic violence as an adult?:  (Pt unable to answer due to AMS - Pt not oriented.)  Child/Adolescent Assessment:     CCA Substance Use Alcohol/Drug Use: Alcohol / Drug Use Pain Medications: See MAR Prescriptions: See MAR Over the Counter: See MAR History of alcohol / drug use?: No history of alcohol / drug abuse                         ASAM's:  Six Dimensions of Multidimensional Assessment  Dimension 1:  Acute Intoxication and/or Withdrawal Potential:      Dimension 2:  Biomedical Conditions and Complications:      Dimension 3:  Emotional, Behavioral, or Cognitive Conditions and Complications:     Dimension 4:  Readiness  to Change:     Dimension 5:  Relapse, Continued use, or Continued Problem Potential:     Dimension 6:   Recovery/Living Environment:     ASAM Severity Score:    ASAM Recommended Level of Treatment:     Substance use Disorder (SUD)    Recommendations for Services/Supports/Treatments:    DSM5 Diagnoses: Patient Active Problem List   Diagnosis Date Noted   Hyperglycemia 08/08/2017   Hypokalemia 08/07/2017   Seizure ([place]) 11/24/2013   Collapse 11/24/2013   Vitamin D deficiency 07/06/2010   Hyperlipidemia 07/05/2010   OVERWEIGHT 03/31/2008   Anxiety state 03/30/2008    Patient Centered Plan: Patient is on the following Treatment Plan(s):  Anxiety   Referrals to Alternative Service(s): Referred to Alternative Service(s):   Place:   Date:   Time:    Referred to Alternative Service(s):   Place:   Date:   Time:    Referred to Alternative Service(s):   Place:   Date:   Time:    Referred to Alternative Service(s):   Place:   Date:   Time:      @BHCOLLABOFCARE @  Anson Fret, Orpah Greek, [facility]

## 2022-01-03 NOTE — ED Notes (Signed)
Pt requesting to go to the family room, she was told to go to the family room.  Explained to pt that she will stay in this room during this visit.  Pt insists she was told by her aunt for her to go the family room.  Reinforced that pt will be staying in her room she is currently in.

## 2022-01-03 NOTE — ED Triage Notes (Signed)
Pt states that she is here because she is under a lot of stress and wants to be checked out.  Per PD, pt has been off her medications for the past month an a half, states that pt is currently paranoid thinking that people are coming into her house and taking pictures of her. States that she thinks that her son who passed away last year is still alive.  States that pt feels as though people are after he, so she has take the plates of her car and parked it behind her house and thrown away her cell phone.

## 2022-01-03 NOTE — ED Notes (Signed)
Security wanded pt since pt changed into burgundy scrubs.

## 2022-01-04 MED ORDER — ZIPRASIDONE MESYLATE 20 MG IM SOLR
20.0000 mg | Freq: Once | INTRAMUSCULAR | Status: AC
  Administered 2022-01-04: 20 mg via INTRAMUSCULAR
  Filled 2022-01-04: qty 20

## 2022-01-04 NOTE — Progress Notes (Signed)
CSW requested that [facility] Lavell Luster, RN review pt. Pt meets criteria per Ethelene Hal, NP. CSW will assist with inpatient behavioral health placement.   Benjaman Kindler, MSW, [facility] 01/04/2022 12:14 AM

## 2022-01-04 NOTE — ED Notes (Signed)
Overton Mam, Baldwin from [facility] called to request facesheet, IVC papers, and labs be faxed for NP to review for bed placement. These papers given to [facility], ED sec to be faxed to facility listed above.

## 2022-01-04 NOTE — ED Notes (Signed)
Pt ambulated to and from bathroom without complications

## 2022-01-04 NOTE — ED Notes (Signed)
Pt walks to the nurses station with the sitter and states "Ma'am excuse me. Is there anyway you can show me his toes?" Pointing to the sitters feet. Pt repeats "I just need to see his left foot." Patient redirected and taken back to room.

## 2022-01-04 NOTE — ED Notes (Signed)
Pt hostile and uncooperative. [employer] called

## 2022-01-04 NOTE — ED Notes (Signed)
Pt requests a RN come to room, entered room, Mother at bedside, pt requests her chart be marked confidential, registration contacted; pt also states she does not want anyone to know she is here or information given to anyone other than her Mother, Hector Shade" Southern [phone], [phone]

## 2022-01-04 NOTE — ED Notes (Signed)
Pt with repetitive speech, continues to request copies of lab results.  Declines hydroxyzine.  Able to be redirected by staff at this time

## 2022-01-04 NOTE — ED Notes (Signed)
Pt eating saltine crackers- pt given sandwich and water also

## 2022-01-04 NOTE — ED Notes (Signed)
Pt given a drink per request.

## 2022-01-04 NOTE — ED Notes (Signed)
Patient requesting to make an "urgent call to her son Barnabas Lister."  Informed patient of current time and that she would be able to use the phone later in the morning.  Patient states understanding and voices "I am just nervous."

## 2022-01-04 NOTE — Progress Notes (Signed)
Per Selina Cooley, patient meets criteria for inpatient treatment. There are no available or appropriate beds at [facility] today. CSW faxed referrals to the following facilities for review:  [facility]  Pending - No Request Sent N/A [address], [place] [address] [phone] --  [place]  Pending - No Request Sent N/A [address], [place] [address] [phone] --  [facility]  Pending - No Request [facility] [address] [place] [address] [phone] --  [place] [address] [place] [address] [phone] --  [place]  Pending - No Request Sent N/A [address] [place] [address] [phone] --  [facility]  Pending - No Request Sent N/A [address] [place], [place] [address] [phone] --  [facility]  Pending - No Request Sent N/A [address] [place] [address] [place] --  [facility]  Pending - No Request Sent N/A [address], [place] [address] [facility]  Pending - No Request Sent N/A [address] [place] [address] [phone] --  [facility]  Pending - No Request Sent N/A [address] [place] [address] [phone] --  [place]  Pending - No Request Sent N/A [address] [place] [address] [phone] --  [facility]  Pending - No Request Sent N/A [address] [place] [address] [phone] --  [place]  Pending - No Request Sent N/A [address] [place] [address] [phone] --   [facility]  Pending - No Request Sent N/A [address], [place] [address] [phone] --  [facility]  Pending - No Request Sent N/A [address] [place]  [address] [phone] --   TTS will continue to seek bed placement.  Glennie [place], MSW, [place], LCAS-A Phone: [phone] Disposition/TOC

## 2022-01-04 NOTE — ED Notes (Signed)
Pt to door again Saying needs to use phone to call and check on son jack Pt made aware unable to use phone at this time Pt verbalizes " Im done with this shit" multiple times, slams door closed

## 2022-01-04 NOTE — ED Notes (Signed)
Pt constantly up and down  Pt request are not comprehensive Pt pacing around the room unable to lay down or sit still

## 2022-01-04 NOTE — ED Notes (Signed)
Pt not redirectable  and argumentative with staff

## 2022-01-04 NOTE — ED Notes (Signed)
Mother to the desk, expresses concern for POC, advised pt is recommended for inpatient placement and awaiting a bed; Mother concerned that pt has no income and has been unable to hold down a job, reports pt has in the past attempted to seek service thru [facility] but was unable to afford charges, Mother would like assistance paying for RX and outpatient services, discussed Medicaid and RX assistance program thru DSS, further advised to speak with SW and [place] closer to pt discharge for additional resources they may have available

## 2022-01-04 NOTE — ED Notes (Signed)
Talking on phone with mom

## 2022-01-05 LAB — POTASSIUM: Potassium: 3.2 mmol/L — ABNORMAL LOW (ref 3.5–5.1)

## 2022-01-05 MED ORDER — POTASSIUM CHLORIDE 20 MEQ PO PACK
20.0000 meq | PACK | Freq: Every day | ORAL | Status: DC
  Administered 2022-01-05 – 2022-01-06 (×2): 20 meq via ORAL
  Filled 2022-01-05 (×2): qty 1

## 2022-01-05 MED ORDER — ZIPRASIDONE MESYLATE 20 MG IM SOLR
20.0000 mg | Freq: Once | INTRAMUSCULAR | Status: AC
Start: 2022-01-06 — End: 2022-01-06
  Administered 2022-01-06: 20 mg via INTRAMUSCULAR
  Filled 2022-01-05: qty 20

## 2022-01-05 MED ORDER — ZIPRASIDONE MESYLATE 20 MG IM SOLR: 20.0000 mg | Freq: Once | INTRAMUSCULAR | Status: AC

## 2022-01-05 MED ORDER — LEVETIRACETAM 500 MG PO TABS
500.0000 mg | ORAL_TABLET | Freq: Two times a day (BID) | ORAL | Status: DC
  Administered 2022-01-05 – 2022-01-06 (×3): 500 mg via ORAL
  Filled 2022-01-05 (×3): qty 1

## 2022-01-05 MED ORDER — STERILE WATER FOR INJECTION IJ SOLN
INTRAMUSCULAR | Status: AC
  Administered 2022-01-05: 10 mL
  Filled 2022-01-05: qty 10

## 2022-01-05 MED ORDER — ZIPRASIDONE MESYLATE 20 MG IM SOLR
INTRAMUSCULAR | Status: AC
  Administered 2022-01-05: 20 mg via INTRAMUSCULAR
  Filled 2022-01-05: qty 20
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
## 2022-01-05 NOTE — ED Provider Notes (Signed)
Patient is medically cleared and can be transferred to the psychiatric unit   Milton Ferguson, MD 01/05/22 1024

## 2022-01-05 NOTE — ED Notes (Signed)
Spoke with Pamala Hurry at [facility] to make sure she received updates that she needed.  States that they have already filled beds there.
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
## 2022-01-05 NOTE — ED Notes (Signed)
Pt asking for seizure medication, pt has not had any seizure medication on her active med list since 05/2021. Dr Christy Gentles made aware of pt request.

## 2022-01-05 NOTE — ED Notes (Signed)
Pt Son and mother are here to visit.  Each will be allowed up to 15 min to visit separately.

## 2022-01-05 NOTE — ED Notes (Signed)
20:40 patient came to door I told her she is okay and to try to get some rest. She looked at me and said "Are you serious?" She turned around and went and sat in bed.

## 2022-01-05 NOTE — ED Notes (Signed)
Misty Howell patient is very agitated again took a lot to redirect her to go back into her room and watch tv

## 2022-01-05 NOTE — ED Notes (Signed)
2111 Patient comes storming out of room saying she wants to go to a hospital I stated this is a hospital she said no a real hospital I said you are at a real hospital please go back to your room. Patient is really agitated she wants to see ms sue in private

## 2022-01-05 NOTE — ED Notes (Signed)
2207 Patient is now agitated because she doesn't know where her brief case is she just needs to find it

## 2022-01-05 NOTE — ED Notes (Signed)
2054 Patient said to shut it down shut everyone down, tell charity and ms sue to come speak to me and then shut it down patient still very agitated and frustrated   2058 patient came back into hall and said im ok im ok just really upset

## 2022-01-05 NOTE — ED Notes (Signed)
Patient is very agitated. Within the last 5 minutes she has come to her door yelling twice. Saying her sons are responsible for her being here and she's not going to keep putting up with this mess.

## 2022-01-05 NOTE — ED Notes (Signed)
Pt very anxious, and paranoid, pt pulled off tape on cabinets and pulled out linens, folding them, then putting them back- linens put back in cabinet and they were re-taped. Dr Christy Gentles made aware- new orders received.

## 2022-01-05 NOTE — ED Notes (Signed)
2128 Patient wants to see Misty Howell she's ready to leave and wants to speak to anyone in the family. Still agitated and frustrated with everyone   2129 Patient taping on glass she wants to see the drs wife now please send them in she states

## 2022-01-05 NOTE — ED Notes (Signed)
Spoke with Pamala Hurry @ [facility].  Requested repeat K and UA due to low potassium and ketones in UA.  ED MD informed and new K level ordered.  No orders for UA due to insignificant finding.  Informed Pamala Hurry, of ordered and she was agreeable to this.  Will fax over IVC, medical clearance and new K results when Lab is back.
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
## 2022-01-05 NOTE — ED Notes (Signed)
2343 Patient came out of her room saying just take me to jail take me to jail I asked her to go back into her room and she proceeded to push past me and went to the nurses station where the nurse redirected her back into her room

## 2022-01-06 ENCOUNTER — Encounter (HOSPITAL_COMMUNITY): Payer: Self-pay | Admitting: Psychiatry

## 2022-01-06 ENCOUNTER — Inpatient Hospital Stay (HOSPITAL_COMMUNITY)
Admission: AD | Admit: 2022-01-06 | Discharge: 2022-01-17 | DRG: 885 | Disposition: A | Discharge status: Home / Self Care | Payer: Federal, State, Local not specified - Other | Source: Intra-hospital | Attending: Psychiatry | Admitting: Psychiatry

## 2022-01-06 ENCOUNTER — Other Ambulatory Visit: Payer: Self-pay

## 2022-01-06 DIAGNOSIS — F411 Generalized anxiety disorder: Secondary | ICD-10-CM

## 2022-01-06 DIAGNOSIS — F23 Brief psychotic disorder: Secondary | ICD-10-CM | POA: Diagnosis present

## 2022-01-06 DIAGNOSIS — E559 Vitamin D deficiency, unspecified: Secondary | ICD-10-CM | POA: Diagnosis present

## 2022-01-06 DIAGNOSIS — Y92239 Unspecified place in hospital as the place of occurrence of the external cause: Secondary | ICD-10-CM | POA: Diagnosis present

## 2022-01-06 DIAGNOSIS — E663 Overweight: Secondary | ICD-10-CM | POA: Diagnosis present

## 2022-01-06 DIAGNOSIS — F4310 Post-traumatic stress disorder, unspecified: Secondary | ICD-10-CM

## 2022-01-06 DIAGNOSIS — G40909 Epilepsy, unspecified, not intractable, without status epilepticus: Secondary | ICD-10-CM | POA: Diagnosis present

## 2022-01-06 DIAGNOSIS — E7800 Pure hypercholesterolemia, unspecified: Secondary | ICD-10-CM | POA: Diagnosis present

## 2022-01-06 DIAGNOSIS — Z87891 Personal history of nicotine dependence: Secondary | ICD-10-CM | POA: Diagnosis not present

## 2022-01-06 DIAGNOSIS — W1809XA Striking against other object with subsequent fall, initial encounter: Secondary | ICD-10-CM | POA: Diagnosis present

## 2022-01-06 DIAGNOSIS — F410 Panic disorder [episodic paroxysmal anxiety] without agoraphobia: Secondary | ICD-10-CM | POA: Diagnosis present

## 2022-01-06 DIAGNOSIS — F1290 Cannabis use, unspecified, uncomplicated: Secondary | ICD-10-CM | POA: Diagnosis present

## 2022-01-06 DIAGNOSIS — F319 Bipolar disorder, unspecified: Secondary | ICD-10-CM | POA: Diagnosis present

## 2022-01-06 DIAGNOSIS — F310 Bipolar disorder, current episode hypomanic: Secondary | ICD-10-CM

## 2022-01-06 DIAGNOSIS — F3113 Bipolar disorder, current episode manic without psychotic features, severe: Secondary | ICD-10-CM | POA: Diagnosis not present

## 2022-01-06 DIAGNOSIS — Z6832 Body mass index (BMI) 32.0-32.9, adult: Secondary | ICD-10-CM | POA: Diagnosis not present

## 2022-01-06 DIAGNOSIS — Z8249 Family history of ischemic heart disease and other diseases of the circulatory system: Secondary | ICD-10-CM

## 2022-01-06 LAB — RESP PANEL BY RT-PCR (FLU A&B, COVID) ARPGX2
Influenza A by PCR: NEGATIVE
Influenza B by PCR: NEGATIVE
SARS Coronavirus 2 by RT PCR: NEGATIVE

## 2022-01-06 LAB — COMPREHENSIVE METABOLIC PANEL
ALT: 20 U/L (ref 0–44)
AST: 18 U/L (ref 15–41)
Albumin: 4.5 g/dL (ref 3.5–5.0)
Alkaline Phosphatase: 75 U/L (ref 38–126)
Anion gap: 12 (ref 5–15)
BUN: 11 mg/dL (ref 6–20)
CO2: 26 mmol/L (ref 22–32)
Calcium: 9.6 mg/dL (ref 8.9–10.3)
Chloride: 102 mmol/L (ref 98–111)
Creatinine, Ser: 0.69 mg/dL (ref 0.44–1.00)
GFR, Estimated: >60 mL/min (ref >60)
Glucose, Bld: 115 mg/dL — ABNORMAL HIGH (ref 70–99)
Potassium: 4.1 mmol/L (ref 3.5–5.1)
Sodium: 140 mmol/L (ref 135–145)
Total Bilirubin: 0.6 mg/dL (ref 0.3–1.2)
Total Protein: 8.1 g/dL (ref 6.5–8.1)

## 2022-01-06 LAB — TSH: TSH: 1.452 u[IU]/mL (ref 0.350–4.500)

## 2022-01-06 MED ORDER — ALUM & MAG HYDROXIDE-SIMETH 200-200-20 MG/5ML PO SUSP: 30.0000 mL | ORAL | Status: DC | PRN

## 2022-01-06 MED ORDER — STERILE WATER FOR INJECTION IJ SOLN
INTRAMUSCULAR | Status: AC
  Filled 2022-01-06: qty 10

## 2022-01-06 MED ORDER — HYDROXYZINE HCL 25 MG PO TABS
25.0000 mg | ORAL_TABLET | Freq: Three times a day (TID) | ORAL | Status: DC | PRN
  Administered 2022-01-07 – 2022-01-10 (×6): 25 mg via ORAL
  Filled 2022-01-06: qty 10
  Filled 2022-01-06 (×6): qty 1

## 2022-01-06 MED ORDER — ACETAMINOPHEN 325 MG PO TABS: 650.0000 mg | ORAL_TABLET | Freq: Four times a day (QID) | ORAL | Status: DC | PRN

## 2022-01-06 MED ORDER — TRAZODONE HCL 50 MG PO TABS
50.0000 mg | ORAL_TABLET | Freq: Every evening | ORAL | Status: DC | PRN
  Administered 2022-01-07 – 2022-01-16 (×9): 50 mg via ORAL
  Filled 2022-01-06 (×6): qty 1
  Filled 2022-01-06: qty 7
  Filled 2022-01-06 (×3): qty 1

## 2022-01-06 MED ORDER — OLANZAPINE 5 MG PO TBDP: 10.0000 mg | ORAL_TABLET | Freq: Every day | ORAL | Status: DC

## 2022-01-06 MED ORDER — ZIPRASIDONE MESYLATE 20 MG IM SOLR
20.0000 mg | Freq: Two times a day (BID) | INTRAMUSCULAR | Status: DC | PRN
  Administered 2022-01-06: 20 mg via INTRAMUSCULAR
  Filled 2022-01-06: qty 20

## 2022-01-06 MED ORDER — OLANZAPINE 5 MG PO TBDP
5.0000 mg | ORAL_TABLET | ORAL | Status: AC
  Administered 2022-01-06: 5 mg via ORAL
  Filled 2022-01-06: qty 1

## 2022-01-06 MED ORDER — OLANZAPINE 10 MG PO TBDP
10.0000 mg | ORAL_TABLET | Freq: Every day | ORAL | Status: DC
  Administered 2022-01-06: 10 mg via ORAL
  Filled 2022-01-06 (×3): qty 1

## 2022-01-06 MED ORDER — LORAZEPAM 1 MG PO TABS
1.0000 mg | ORAL_TABLET | ORAL | Status: AC | PRN
  Administered 2022-01-07: 1 mg via ORAL
  Filled 2022-01-06: qty 1

## 2022-01-06 MED ORDER — MAGNESIUM HYDROXIDE 400 MG/5ML PO SUSP: 30.0000 mL | Freq: Every day | ORAL | Status: DC | PRN

## 2022-01-06 MED ORDER — TRAZODONE HCL 50 MG PO TABS: 50.0000 mg | ORAL_TABLET | Freq: Every day | ORAL | Status: DC

## 2022-01-06 NOTE — ED Notes (Signed)
Attempted to call patient's mom; no answer at this time

## 2022-01-06 NOTE — Progress Notes (Signed)
Pt was accepted to [facility] today at 1600; Bed assignment 503-2  Pt meets inpatient criteria per Ethelene Hal, NP  Attending Physician will be Dr. Berdine Addison  Report can be called to: Adult unit: [phone]  Pt can arrive after [place] coordination team: Irena Reichmann, RN, Ethelene Hal, NP, Wynonia Hazard, RN, and [facility] Lynnda Shields, RN.   Nadara Mode, LCSWA 01/06/2022 @ 5:22 PM
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
## 2022-01-06 NOTE — ED Notes (Signed)
Pt took shower °
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
## 2022-01-06 NOTE — Tx Team (Signed)
Initial Treatment Plan 01/06/2022 10:02 PM Misty Howell RCB:[id number]    PATIENT STRESSORS: Other: none stated by pt     PATIENT STRENGTHS: Supportive family/friends    PATIENT IDENTIFIED PROBLEMS: Psychosis  Agitation  Medication non-compliance  ("I want to figure out events.")               DISCHARGE CRITERIA:  Adequate post-discharge living arrangements Improved stabilization in mood, thinking, and/or behavior Verbal commitment to aftercare and medication compliance  PRELIMINARY DISCHARGE PLAN: Attend aftercare/continuing care group Outpatient therapy Return to previous living arrangement  PATIENT/FAMILY INVOLVEMENT: This treatment plan has been presented to and reviewed with the patient, Misty Howell, and/or family member.  The patient and family have been given the opportunity to ask questions and make suggestions.  Lajoyce Corners, RN 01/06/2022, 10:02 PM

## 2022-01-06 NOTE — ED Notes (Signed)
0031 patient came into the hall yelling and very agitated redirected back into her room nurse is getting her a snack

## 2022-01-06 NOTE — ED Notes (Signed)
0620 Patient is very agitated, she states she's about to go live on tv and if her mom and sons don't come see her everyone is getting shut down. The whole place is getting shut down. Someone better call her real soon. Very agitated and raising her voice.

## 2022-01-06 NOTE — ED Notes (Signed)
Pt spoke with mom on phone.

## 2022-01-06 NOTE — ED Notes (Signed)
Pt came out room and asked what my name was I told her "Jerene Pitch" she then asked what my last name was and I told her I could not give out that information, and she said "if you do not give me your last name and DOB then i'm calling the police.

## 2022-01-06 NOTE — ED Notes (Signed)
0038 patient is very agitated, says this is all crazy nurse gave her a sandwich trying to calm her down, keeps coming to the door raising her voice

## 2022-01-06 NOTE — ED Notes (Signed)
0007 patient is standing at the door saying this whole thing is a trick just wet her hair again in the sink, patient came into the hall raising her voice at me being very aggressive saying that I'm going to get it. Redirected back to her room  0010 Patient came back into the hallway yelling take me to the hospital im not signing anything. Very agitated and frustrated nurse redirected her back into her room

## 2022-01-06 NOTE — ED Notes (Signed)
Patient continues to pace the room and has washed her hair in the sink 3 times this morning.

## 2022-01-06 NOTE — ED Notes (Signed)
Patient sitting legs crossed in her lap with eyes closed at this time. Patient is calm
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
## 2022-01-06 NOTE — ED Notes (Signed)
TTS at bedside. 
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
## 2022-01-06 NOTE — ED Notes (Signed)
Pt keeps sitting in the floor and standing at the glass staring at me.   Pt took her blanket out of her room and threw it away, I brought her more but she stated I will speak with someone else about that.

## 2022-01-06 NOTE — ED Notes (Signed)
Patient given new scrubs at this time

## 2022-01-06 NOTE — ED Notes (Signed)
0629 Patient is sitting on the floor in her room crying. States she's ready to sign the papers she will just sign them and we can keep all the money

## 2022-01-06 NOTE — ED Provider Notes (Signed)
Emergency Medicine Observation Re-evaluation Note  Misty Howell is a 56 y.o. female, seen on rounds today.  Pt initially presented to the ED for complaints of Psychiatric Evaluation Currently, the patient is sleeping.  Physical Exam  BP (!) 146/108 (BP Location: Right Arm)    Pulse (!) 109    Temp 98.5 F (36.9 C) (Oral)    Resp 14    Ht 5\' 4"  (1.626 m)    Wt 77.1 kg    SpO2 97%    BMI 29.18 kg/m  Physical Exam General: NAD Cardiac: Regular rate Lungs: No respiratory distress Psych: Stable  ED Course / MDM  EKG:   I have reviewed the labs performed to date as well as medications administered while in observation.  Recent changes in the last 24 hours include geodon given last night for agitation; K recheck yesterday improved now to 3.2.  Plan  Current plan is for inpatient psychiatric placement [facility] is medically cleared; can continue on oral K for 5 days [facility] is under involuntary commitment.      Wyvonnia Dusky, MD 01/06/22 [phone]

## 2022-01-06 NOTE — ED Notes (Addendum)
Pt standing at door staring at me with hands on the rail.

## 2022-01-07 DIAGNOSIS — F310 Bipolar disorder, current episode hypomanic: Secondary | ICD-10-CM

## 2022-01-07 DIAGNOSIS — F4310 Post-traumatic stress disorder, unspecified: Secondary | ICD-10-CM

## 2022-01-07 DIAGNOSIS — F3113 Bipolar disorder, current episode manic without psychotic features, severe: Secondary | ICD-10-CM

## 2022-01-07 DIAGNOSIS — F411 Generalized anxiety disorder: Secondary | ICD-10-CM

## 2022-01-07 LAB — URINALYSIS, COMPLETE (UACMP) WITH MICROSCOPIC
Bilirubin Urine: NEGATIVE
Glucose, UA: NEGATIVE mg/dL
Hgb urine dipstick: NEGATIVE
Ketones, ur: 5 mg/dL — AB
Leukocytes,Ua: NEGATIVE
Nitrite: NEGATIVE
Protein, ur: NEGATIVE mg/dL
Specific Gravity, Urine: 1.005 (ref 1.005–1.030)
pH: 5 (ref 5.0–8.0)

## 2022-01-07 LAB — COMPREHENSIVE METABOLIC PANEL
ALT: 20 U/L (ref 0–44)
AST: 17 U/L (ref 15–41)
Albumin: 4.4 g/dL (ref 3.5–5.0)
Alkaline Phosphatase: 73 U/L (ref 38–126)
Anion gap: 10 (ref 5–15)
BUN: 11 mg/dL (ref 6–20)
CO2: 25 mmol/L (ref 22–32)
Calcium: 9.3 mg/dL (ref 8.9–10.3)
Chloride: 100 mmol/L (ref 98–111)
Creatinine, Ser: 0.75 mg/dL (ref 0.44–1.00)
GFR, Estimated: >60 mL/min (ref >60)
Glucose, Bld: 185 mg/dL — ABNORMAL HIGH (ref 70–99)
Potassium: 3.7 mmol/L (ref 3.5–5.1)
Sodium: 135 mmol/L (ref 135–145)
Total Bilirubin: 0.6 mg/dL (ref 0.3–1.2)
Total Protein: 7.6 g/dL (ref 6.5–8.1)

## 2022-01-07 LAB — CBC
HCT: 42.1 % (ref 36.0–46.0)
Hemoglobin: 14.2 g/dL (ref 12.0–15.0)
MCH: 30.1 pg (ref 26.0–34.0)
MCHC: 33.7 g/dL (ref 30.0–36.0)
MCV: 89.4 fL (ref 80.0–100.0)
Platelets: 257 10*3/uL (ref 150–400)
RBC: 4.71 MIL/uL (ref 3.87–5.11)
RDW: 12.5 % (ref 11.5–15.5)
WBC: 6 10*3/uL (ref 4.0–10.5)
nRBC: 0 % (ref 0.0–0.2)

## 2022-01-07 LAB — LIPID PANEL
Cholesterol: 154 mg/dL (ref 0–200)
HDL: 49 mg/dL (ref >40)
LDL Cholesterol: 74 mg/dL (ref 0–99)
Total CHOL/HDL Ratio: 3.1 RATIO
Triglycerides: 153 mg/dL — ABNORMAL HIGH (ref <150)
VLDL: 31 mg/dL (ref 0–40)

## 2022-01-07 LAB — VITAMIN D 25 HYDROXY (VIT D DEFICIENCY, FRACTURES): Vit D, 25-Hydroxy: 6.98 ng/mL — ABNORMAL LOW (ref 30–100)

## 2022-01-07 LAB — PREGNANCY, URINE: Preg Test, Ur: NEGATIVE

## 2022-01-07 LAB — HEMOGLOBIN A1C
Hgb A1c MFr Bld: 5.6 % (ref 4.8–5.6)
Mean Plasma Glucose: 114.02 mg/dL

## 2022-01-07 MED ORDER — DIVALPROEX SODIUM 500 MG PO DR TAB
500.0000 mg | DELAYED_RELEASE_TABLET | Freq: Two times a day (BID) | ORAL | Status: DC
  Filled 2022-01-07 (×6): qty 1

## 2022-01-07 MED ORDER — TRAZODONE HCL 50 MG PO TABS
50.0000 mg | ORAL_TABLET | Freq: Once | ORAL | Status: AC
  Administered 2022-01-08: 50 mg via ORAL
  Filled 2022-01-07 (×2): qty 1

## 2022-01-07 MED ORDER — LORAZEPAM 1 MG PO TABS: 1.0000 mg | ORAL_TABLET | ORAL | Status: DC | PRN

## 2022-01-07 MED ORDER — OLANZAPINE 10 MG PO TBDP
10.0000 mg | ORAL_TABLET | Freq: Two times a day (BID) | ORAL | Status: DC
  Administered 2022-01-07 – 2022-01-17 (×21): 10 mg via ORAL
  Filled 2022-01-07 (×25): qty 1

## 2022-01-07 MED ORDER — ZIPRASIDONE MESYLATE 20 MG IM SOLR: 20.0000 mg | INTRAMUSCULAR | Status: DC | PRN

## 2022-01-07 MED ORDER — PROPRANOLOL HCL 10 MG PO TABS
10.0000 mg | ORAL_TABLET | Freq: Two times a day (BID) | ORAL | Status: DC
  Administered 2022-01-07 – 2022-01-08 (×2): 10 mg via ORAL
  Filled 2022-01-07 (×6): qty 1

## 2022-01-07 MED ORDER — OLANZAPINE 5 MG PO TBDP: 5.0000 mg | ORAL_TABLET | Freq: Three times a day (TID) | ORAL | Status: DC | PRN

## 2022-01-07 MED ORDER — LEVETIRACETAM 500 MG PO TABS
500.0000 mg | ORAL_TABLET | Freq: Two times a day (BID) | ORAL | Status: DC
  Administered 2022-01-07 – 2022-01-17 (×21): 500 mg via ORAL
  Filled 2022-01-07 (×24): qty 1
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
## 2022-01-07 NOTE — Progress Notes (Signed)
Pt did not take her Depakote tonight. Pt tried to swallow the pill but said it got caught in her throat and she couldn't get it down. Provider on call notified.

## 2022-01-07 NOTE — Progress Notes (Signed)
Pt was encouraged but didn't attend orientation/goals group.

## 2022-01-07 NOTE — Progress Notes (Signed)
Recreation Therapy Notes  INPATIENT RECREATION THERAPY ASSESSMENT  Patient Details Name: AUDRYNA WENDT MRN: [id number] DOB: 06-29-1966 Today's Date: 01/07/2022       Information Obtained From: Patient  Able to Participate in Assessment/Interview: Yes  Patient Presentation: Hyperverbal, Anxious  Reason for Admission (Per Patient): Other (Comments) (Anxious)  Patient Stressors: Other (Comment) (pt stated she was worried about things from the past)  Coping Skills:   Isolation, TV, Music, Meditate, Deep Breathing, Art, Prayer  Leisure Interests (2+):  Music - Listen, Individual - Reading, Petra Kuba - Other (Comment) ([place]; Wishing well)  Frequency of Recreation/Participation: Other (Comment) (Pt stated she hasn't done any of these things in "a few years".)  Awareness of Community Resources:  No  Expressed Interest in [employer] Information: No  County of Residence:  [place]  Patient Main Form of Transportation: Other (Comment) (Train/Plane)  Patient Strengths:  Optimistic, Positive attitude  Patient Identified Areas of Improvement:  Listening skills, Journal more (pt stated she had seizures in the past and is worried about her physical health)  Patient Goal for Hospitalization:  "get better, improve myself, be better for family"  Current SI (including self-harm):  No  Current HI:  No  Current AVH: Yes ("hearing voices telling me to do things")  Staff Intervention Plan: Group Attendance, Collaborate with Interdisciplinary Treatment Team  Consent to Intern Participation: N/A    Victorino Sparrow, Vickki Muff, [place] 01/07/2022, 1:07 PM
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
## 2022-01-07 NOTE — [facility] Group Notes (Signed)
Adult Psychoeducational Group Note  Date:  01/07/2022 Time:  9:02 PM  Group Topic/Focus:  Wrap-Up Group:   The focus of this group is to help patients review their daily goal of treatment and discuss progress on daily workbooks.  Participation Level:  Active  Participation Quality:  Appropriate and Attentive  Affect:  Anxious  Cognitive:  Disorganized and Delusional  Insight: Lacking  Engagement in Group:  Engaged  Modes of Intervention:  Clarification, Discussion, and Education  Additional Comments:  Pt attended and participated in wrap up group this evening Pt shared that they have an ongoing goal to "be a better person but immediately followed it with "I have no further comment".   Cristi Loron 01/07/2022, 9:02 PM
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
## 2022-01-07 NOTE — Progress Notes (Signed)
Pt observed with increased paranoia, restlessness,delusions, disorganized and confusion this shift. Believed she's pregnant "I have a baby in my stomach that needs to come out now". Observed calling 911 at multiple intervals despite multiple verbal redirections and education on safety / appropriate phone use. Per pt "My family is in danger. I need the police to go there now. You are blocking from protecting my family now. I need to go meet my ex-husband outside right now". Continues to be tearful and not verbally redirectable at the time. PRN Ativan 1 mg PO given at 1156 without effect.Assigned provider notified, new ordered received for Zyprexa 10 mg PO and administered as ordered.  Support, encouragement and reassurance provided to pt this shift. Safety checks maintained at Q 15 minutes intervals without incident.  Pt tolerates his medications well without discomfort thus far this shift. Pt asleep when reassessed post Zyprexa.

## 2022-01-08 ENCOUNTER — Encounter (HOSPITAL_COMMUNITY): Payer: Self-pay

## 2022-01-08 MED ORDER — PROPRANOLOL HCL 10 MG PO TABS
10.0000 mg | ORAL_TABLET | Freq: Three times a day (TID) | ORAL | Status: DC
  Administered 2022-01-08 – 2022-01-10 (×6): 10 mg via ORAL
  Filled 2022-01-08 (×9): qty 1

## 2022-01-08 MED ORDER — LORAZEPAM 1 MG PO TABS: 1.0000 mg | ORAL_TABLET | Freq: Once | ORAL | Status: DC

## 2022-01-08 MED ORDER — LORAZEPAM 1 MG PO TABS
1.0000 mg | ORAL_TABLET | Freq: Once | ORAL | Status: AC
  Administered 2022-01-08: 1 mg via ORAL
  Filled 2022-01-08: qty 1

## 2022-01-08 MED ORDER — DIVALPROEX SODIUM 125 MG PO CSDR
500.0000 mg | DELAYED_RELEASE_CAPSULE | Freq: Two times a day (BID) | ORAL | Status: DC
  Administered 2022-01-08 – 2022-01-17 (×19): 500 mg via ORAL
  Filled 2022-01-08 (×22): qty 4

## 2022-01-08 MED ORDER — VITAMIN D (ERGOCALCIFEROL) 1.25 MG (50000 UNIT) PO CAPS
50000.0000 [IU] | ORAL_CAPSULE | ORAL | Status: DC
  Administered 2022-01-08 – 2022-01-15 (×2): 50000 [IU] via ORAL
  Filled 2022-01-08 (×2): qty 1

## 2022-01-08 NOTE — Progress Notes (Signed)
Patient ID: Misty Howell, female   DOB: 07-Jul-1966, 56 y.o.   MRN: [id number] ? ? ?Pt c/o increased anxiety. Pt offered medication. Atarax 25 mg, PRN given. ?

## 2022-01-08 NOTE — Progress Notes (Signed)
?   01/08/22 1345  ?Psych Admission Type (Psych Patients Only)  ?Admission Status Involuntary  ?Psychosocial Assessment  ?Patient Complaints Anxiety;Depression  ?Eye Contact Glaring  ?Facial Expression Anxious;Worried  ?Affect Anxious;Preoccupied  ?Speech Pressured;Tangential  ?Interaction Assertive  ?Motor Activity Pacing  ?Appearance/Hygiene Disheveled  ?Behavior Characteristics Cooperative;Anxious  ?Mood Preoccupied  ?Thought Process  ?Coherency Disorganized  ?Content Preoccupation  ?Delusions None reported or observed  ?Perception Hallucinations  ?Hallucination Auditory  ?Judgment Impaired  ?Confusion Moderate  ?Danger to Self  ?Current suicidal ideation? Denies  ?Danger to Others  ?Danger to Others None reported or observed  ? ? ?

## 2022-01-08 NOTE — BH IP Treatment Plan (Signed)
Interdisciplinary Treatment and Diagnostic Plan Update  01/08/2022 Time of Session: 9:50am  Misty Howell MRN: [id number]  Principal Diagnosis: Bipolar I disorder, current or most recent episode manic, severe ([place])  Secondary Diagnoses: Principal Problem:   Bipolar I disorder, current or most recent episode manic, severe ([facility]) Active Problems:   Acute psychosis ([place])   GAD (generalized anxiety disorder)   PTSD (post-traumatic stress disorder)   Current Medications:  Current Facility-Administered Medications  Medication Dose Route Frequency Provider Last Rate Last Admin   acetaminophen (TYLENOL) tablet 650 mg  650 mg Oral Q6H PRN Ethelene Hal, NP       alum & mag hydroxide-simeth (MAALOX/MYLANTA) 200-200-20 MG/5ML suspension 30 mL  30 mL Oral Q4H PRN Ethelene Hal, NP       divalproex (DEPAKOTE SPRINKLE) capsule 500 mg  500 mg Oral Q12H Massengill, Ovid Curd, MD   500 mg at 01/08/22 1008   hydrOXYzine (ATARAX) tablet 25 mg  25 mg Oral TID PRN Ethelene Hal, NP   25 mg at 01/08/22 1243   levETIRAcetam (KEPPRA) tablet 500 mg  500 mg Oral BID Massengill, Ovid Curd, MD   500 mg at 01/08/22 0757   OLANZapine zydis (ZYPREXA) disintegrating tablet 5 mg  5 mg Oral Q8H PRN Massengill, Ovid Curd, MD       And   LORazepam (ATIVAN) tablet 1 mg  1 mg Oral PRN Massengill, Ovid Curd, MD       And   ziprasidone (GEODON) injection 20 mg  20 mg Intramuscular PRN Massengill, Ovid Curd, MD       magnesium hydroxide (MILK OF MAGNESIA) suspension 30 mL  30 mL Oral Daily PRN Ethelene Hal, NP       OLANZapine zydis (ZYPREXA) disintegrating tablet 10 mg  10 mg Oral BID Massengill, Ovid Curd, MD   10 mg at 01/08/22 0757   propranolol (INDERAL) tablet 10 mg  10 mg Oral TID Janine Limbo, MD   10 mg at 01/08/22 1243   traZODone (DESYREL) tablet 50 mg  50 mg Oral QHS PRN Ethelene Hal, NP   50 mg at 01/07/22 2100   Vitamin D (Ergocalciferol) (DRISDOL) capsule 50,000 Units  50,000  Units Oral Q7 days [name], MD       PTA Medications: Medications Prior to Admission  Medication Sig Dispense Refill Last Dose   Eszopiclone 3 MG TABS Take 1 tablet (3 mg total) by mouth at bedtime as needed. Take immediately before bedtime (Patient not taking: Reported on 01/04/2022) 10 tablet 0    HYDROcodone-acetaminophen (NORCO/VICODIN) 5-325 MG tablet Take 1 tablet by mouth every 6 (six) hours as needed for severe pain. (Patient not taking: Reported on 01/04/2022) 10 tablet 0    hydrOXYzine (ATARAX) 25 MG tablet Take 1 tablet (25 mg total) by mouth every 6 (six) hours as needed for anxiety. (Patient not taking: Reported on 01/04/2022) 30 tablet 0    ibuprofen (ADVIL) 600 MG tablet Take 1 tablet (600 mg total) by mouth every 6 (six) hours as needed. (Patient not taking: Reported on 01/04/2022) 30 tablet 0    levETIRAcetam (KEPPRA) 500 MG tablet Take 1 tablet (500 mg total) by mouth 2 (two) times daily. (Patient not taking: Reported on 01/04/2022) 60 tablet 1    lidocaine (LIDODERM) 5 % Place 1 patch onto the skin daily. Remove & Discard patch within 12 hours or as directed by MD (Patient not taking: Reported on 01/04/2022) 30 patch 0    methocarbamol (ROBAXIN) 750 MG tablet Take  1 tablet (750 mg total) by mouth 2 (two) times daily as needed for muscle spasms (or muscle tightness). (Patient not taking: Reported on 01/04/2022) 20 tablet 0    nitrofurantoin, macrocrystal-monohydrate, (MACROBID) 100 MG capsule Take 1 capsule (100 mg total) by mouth 2 (two) times daily. (Patient not taking: Reported on 01/04/2022) 10 capsule 0    ondansetron (ZOFRAN ODT) 4 MG disintegrating tablet Take 1 tablet (4 mg total) by mouth every 8 (eight) hours as needed for nausea or vomiting. (Patient not taking: Reported on 01/04/2022) 20 tablet 0     Patient Stressors: Other: none stated by pt    Patient Strengths: Supportive family/friends   Treatment Modalities: Medication Management, Group therapy, Case management,   1 to 1 session with clinician, Psychoeducation, Recreational therapy.   Physician Treatment Plan for Primary Diagnosis: Bipolar I disorder, current or most recent episode manic, severe ([place]) Long Term Goal(s): Improvement in symptoms so as ready for discharge   Short Term Goals: Ability to identify changes in lifestyle to reduce recurrence of condition will improve Ability to verbalize feelings will improve Ability to demonstrate self-control will improve Ability to identify and develop effective coping behaviors will improve Ability to maintain clinical measurements within normal limits will improve Compliance with prescribed medications will improve Ability to identify triggers associated with substance abuse/mental health issues will improve  Medication Management: Evaluate patient's response, side effects, and tolerance of medication regimen.  Therapeutic Interventions: 1 to 1 sessions, Unit Group sessions and Medication administration.  Evaluation of Outcomes: Not Met  Physician Treatment Plan for Secondary Diagnosis: Principal Problem:   Bipolar I disorder, current or most recent episode manic, severe ([place]) Active Problems:   Acute psychosis ([place])   GAD (generalized anxiety disorder)   PTSD (post-traumatic stress disorder)  Long Term Goal(s): Improvement in symptoms so as ready for discharge   Short Term Goals: Ability to identify changes in lifestyle to reduce recurrence of condition will improve Ability to verbalize feelings will improve Ability to demonstrate self-control will improve Ability to identify and develop effective coping behaviors will improve Ability to maintain clinical measurements within normal limits will improve Compliance with prescribed medications will improve Ability to identify triggers associated with substance abuse/mental health issues will improve     Medication Management: Evaluate patient's response, side effects, and tolerance of  medication regimen.  Therapeutic Interventions: 1 to 1 sessions, Unit Group sessions and Medication administration.  Evaluation of Outcomes: Not Met   RN Treatment Plan for Primary Diagnosis: Bipolar I disorder, current or most recent episode manic, severe ([place]) Long Term Goal(s): Knowledge of disease and therapeutic regimen to maintain health will improve  Short Term Goals: Ability to remain free from injury will improve, Ability to participate in decision making will improve, Ability to verbalize feelings will improve, Ability to disclose and discuss suicidal ideas, and Ability to identify and develop effective coping behaviors will improve  Medication Management: RN will administer medications as ordered by provider, will assess and evaluate patient's response and provide education to patient for prescribed medication. RN will report any adverse and/or side effects to prescribing provider.  Therapeutic Interventions: 1 on 1 counseling sessions, Psychoeducation, Medication administration, Evaluate responses to treatment, Monitor vital signs and CBGs as ordered, Perform/monitor CIWA, COWS, AIMS and Fall Risk screenings as ordered, Perform wound care treatments as ordered.  Evaluation of Outcomes: Not Met   LCSW Treatment Plan for Primary Diagnosis: Bipolar I disorder, current or most recent episode manic, severe ([place]) Long Term Goal(s):  Safe transition to appropriate next level of care at discharge, Engage patient in therapeutic group addressing interpersonal concerns.  Short Term Goals: Engage patient in aftercare planning with referrals and resources, Increase social support, Increase emotional regulation, Facilitate acceptance of mental health diagnosis and concerns, Identify triggers associated with mental health/substance abuse issues, and Increase skills for wellness and recovery  Therapeutic Interventions: Assess for all discharge needs, 1 to 1 time with Social worker, Explore  available resources and support systems, Assess for adequacy in community support network, Educate family and significant other(s) on suicide prevention, Complete Psychosocial Assessment, Interpersonal group therapy.  Evaluation of Outcomes: Not Met   Progress in Treatment: Attending groups: Yes. Participating in groups: Yes. Taking medication as prescribed: Yes. Toleration medication: Yes. Family/Significant other contact made: Yes, individual(s) contacted:  Mother  Patient understands diagnosis: No. Discussing patient identified problems/goals with staff: Yes. Medical problems stabilized or resolved: Yes. Denies suicidal/homicidal ideation: Yes. Issues/concerns per patient self-inventory: No.   New problem(s) identified: No, Describe:  None   New Short Term/Long Term Goal(s): medication stabilization, elimination of SI thoughts, development of comprehensive mental wellness plan.   Patient Goals: "To be a better person"   Discharge Plan or Barriers: Patient recently admitted. CSW will continue to follow and assess for appropriate referrals and possible discharge planning.   Reason for Continuation of Hospitalization: Anxiety Hallucinations Medication stabilization  Estimated Length of Stay: 3 to 5 days    Scribe for Treatment Team: Darleen Crocker, Latanya Presser 01/08/2022 2:35 PM

## 2022-01-08 NOTE — BHH Suicide Risk Assessment (Signed)
B[facility] INPATIENT:  Family/Significant Other Suicide Prevention Education ? ?Suicide Prevention Education:  ?Education Completed; mother [name] [phone]), has been identified by the patient as the family member/significant other with whom the patient will be residing, and identified as the person(s) who will aid the patient in the event of a mental health crisis (suicidal ideations/suicide attempt).  With written consent from the patient, the family member/significant other has been provided the following suicide prevention education, prior to the and/or following the discharge of the patient. ? ?CSW spoke with patients mother who shared that this patient had been having bad episodes for the past 3-4 days. She has not been sleeping. She took her tags off of her car, she took all of her belongings out of her room and piled them up in an odd spot of the house.  ?Per mother this patient had a similar episode 2 years ago which she states was worse than this time. She was hospitalized at a facility in [place] at the time.  ? ?Patients mother states this patient is unable to return to live with her in the current state she is in. She is fearful that this patient will decompensate again when she returns home.  ? ? ?The suicide prevention education provided includes the following: ?Suicide risk factors ?Suicide prevention and interventions ?National Suicide Hotline telephone number ?[facility] assessment telephone number ?[facility] Emergency Assistance 911 ?[place] and/or Residential Mobile Crisis Unit telephone number ? ?Request made of family/significant other to: ?Remove weapons (e.g., guns, rifles, knives), all items previously/currently identified as safety concern.   ?Remove drugs/medications (over-the-counter, prescriptions, illicit drugs), all items previously/currently identified as a safety concern. ? ?The family member/significant other verbalizes understanding of the  suicide prevention education information provided.  The family member/significant other agrees to remove the items of safety concern listed above. ? ?[place] ?01/08/2022, 10:31 AM ?

## 2022-01-08 NOTE — BHH Counselor (Addendum)
CSW received call from this patients sister, Gibson Ramp who shared that this patient is unable to return to live with her mother.  ? ?CSW does not have consent to speak with patients sister. No information was provided to patients sister around patients treatment however, sister was able to provide information to CSW.  ? ?Patients sister shared that she has found a long term care facility in [place], [place] called [facility]. She reports that the director Tammy needs and FL-2 for this patient and would like to set up a meeting with this patient. [facility] fax number is: [phone]. Patients sister and mom are hoping she will be able to discharge to this facility. CSW asked patients sister about financial obligations for this facility in which she reported that this patients mother is working to get medicaid for this patient to cover her care.  ? ? ?This patient is currently her own guardian and will ultimately need to agree to this placement.  ? ? ? ?Darletta Moll MSW, LCSW ?Clincal Social Worker  ?[facility]   ?

## 2022-01-08 NOTE — Plan of Care (Signed)
?  Problem: Activity: ?Goal: Interest or engagement in activities will improve ?Outcome: Progressing ?  ?Problem: Coping: ?Goal: Ability to verbalize frustrations and anger appropriately will improve ?Outcome: Progressing ?  ?Problem: Coping: ?Goal: Ability to demonstrate self-control will improve ?Outcome: Progressing ?  ?Problem: Safety: ?Goal: Periods of time without injury will increase ?Outcome: Progressing ?  ?Problem: Health Behavior/Discharge Planning: ?Goal: Compliance with prescribed medication regimen will improve ?Outcome: Progressing ?  ?Problem: Nutritional: ?Goal: Ability to achieve adequate nutritional intake will improve ?Outcome: Progressing ?  ?

## 2022-01-08 NOTE — BHH Group Notes (Signed)
Adult Psychoeducational Group Note ? ?Date:  01/08/2022 ?Time:  9:24 AM ? ?Group Topic/Focus:  ?Goals Group:   The focus of this group is to help patients establish daily goals to achieve during treatment and discuss how the patient can incorporate goal setting into their daily lives to aide in recovery. ? ?Participation Level:  Did Not Attend ? ? ?Misty Howell ?01/08/2022, 9:24 AM ?

## 2022-01-08 NOTE — Group Note (Signed)
LCSW Group Therapy Note ? ?01/08/2022 at 1:00pm ? ?Type of Therapy and Topic:  Group Therapy:  Setting Goals ? ?Participation Level:  Minimal ? ?Description of Group: ?In this process group, patients discussed using strengths to work toward goals and address challenges.  Patients identified two positive things about themselves and one goal they were working on.  Patients were given the opportunity to share openly and support each other?s plan for self-empowerment.  The group discussed the value of gratitude and were encouraged to have a daily reflection of positive characteristics or circumstances.  Patients were encouraged to identify a plan to utilize their strengths to work on current challenges and goals. ? ?Therapeutic Goals ?Patient will verbalize personal strengths/positive qualities and relate how these can assist with achieving desired personal goals ?Patients will verbalize affirmation of peers plans for personal change and goal setting ?Patients will explore the value of gratitude and positive focus as related to successful achievement of goals ?Patients will verbalize a plan for regular reinforcement of personal positive qualities and circumstances. ? ?Summary of Patient Progress: Patient came to group and participated. Patient shared she would like to be able to talk less and be more mindful of others. She states barriers to this is when she gets excited. Patient was drowsy during group and was seen to doze off at times.  ? ? ? ?Therapeutic Modalities ?Cognitive Behavioral Therapy ?Motivational Interviewing ? ? ?Darletta Moll MSW, LCSW ?Clincal Social Worker  ?[facility]  ?

## 2022-01-08 NOTE — Progress Notes (Signed)
Pt states that her anxiety is 4-5/10. Contacted provider. Given one-time orders.  ?"I'm okay. I'm not mad at anybody or anything. Can I have a book? There's one with this [place] lady on it. Do I need my shot right now?" Pt ruminating on getting shots. Pt told that there is no need for a shot. Pt asked to stay in her room until time for patients to get up later in the morning so as not to disturb anyone who is already sleeping. "Okay. Thanks so much." Pt given PRNs per MAR. Will continue to monitor. ?
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
## 2022-01-08 NOTE — Group Note (Signed)
Recreation Therapy Group Note ? ? ?Group Topic:Leisure Education  ?Group Date: 01/08/2022 ?Start Time: 1009 ?End Time: 1607 ?Facilitators: Victorino Sparrow, LRT,CTRS ?Location: [facility] ? ? ?Goal Area(s) Addresses:  ?Patient will successfully identify positive leisure and recreation activities.  ?Patient will acknowledge benefits of participation in healthy leisure activities post discharge.  ?Patient will actively work with peers toward a shared goal. ?  ?Group Description: Pictionary. Patients took turns trying to guess the picture being drawn on the board by their peer.  If one of the peers guessed the correct answer, they got the next turn.  If no one guessed the correct answer, LRT would draw the next picture.  Post-activity discussion reviewed benefits of positive recreation outlets: reducing stress, improving coping mechanisms, increasing self-esteem, and building larger support systems. ? ? ?Affect/Mood: Appropriate ?  ?Participation Level: Engaged ?  ?Participation Quality: Independent ?  ?Behavior: Appropriate and Attentive  ?  ?Speech/Thought Process: Relevant and Rational ?  ?Insight: Good ?  ?Judgement: Good ?  ?Modes of Intervention: Competitive Play ?  ?Patient Response to Interventions:  Engaged ?  ?Education Outcome: ? Acknowledges education and In group clarification offered   ? ?Clinical Observations/Individualized Feedback: Pt was more interactive during group.  Pt didn't seem anxious.  Pt speech was more rational and relevant to the activity.  Pt was able to remain focused during activity.  Pt was bright and engaged well with peers.  Pt appeared to have a good time with the activity.   ? ? ?Plan: Continue to engage patient in RT group sessions 2-3x/week. ? ? ?Victorino Sparrow, LRT,CTRS ?01/08/2022 12:28 PM ?

## 2022-01-08 NOTE — Progress Notes (Signed)
Pt up and asking about her mother. "Has my mother called? Have you gotten in touch with her? I want to see how the boys are. If she calls back come get me. Do I take my medication now? I have to have my Keppra. Do I take it at 6?" Pt told that it is 0300 and medications not due until 0800. "Can I go outside in the morning? Let me know when you go outside. I just want to get a little fresh air. When can I get my medication?" Pt redirected to go to her room.  ?
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
## 2022-01-09 MED ORDER — HALOPERIDOL 2 MG PO TABS
2.5000 mg | ORAL_TABLET | Freq: Two times a day (BID) | ORAL | Status: DC
  Administered 2022-01-09 – 2022-01-10 (×2): 2.5 mg via ORAL
  Filled 2022-01-09 (×5): qty 1

## 2022-01-09 NOTE — Progress Notes (Signed)
Pt was encouraged but didn't attend orientation/goals group. ?

## 2022-01-09 NOTE — Progress Notes (Signed)
?   01/09/22 1611  ?Psych Admission Type (Psych Patients Only)  ?Admission Status Involuntary  ?Psychosocial Assessment  ?Patient Complaints Anxiety;Worrying  ?Eye Contact Glaring  ?Facial Expression Anxious;Worried  ?Affect Anxious;Preoccupied  ?Speech Pressured;Tangential  ?Interaction Assertive  ?Motor Activity Fidgety;Pacing;Restless  ?Appearance/Hygiene Disheveled  ?Behavior Characteristics Cooperative;Anxious;Calm  ?Mood Preoccupied  ?Thought Process  ?Coherency Disorganized  ?Content Preoccupation  ?Delusions None reported or observed  ?Perception Hallucinations  ?Hallucination Auditory  ?Judgment Impaired  ?Confusion Moderate  ?Danger to Self  ?Current suicidal ideation? Denies  ?Danger to Others  ?Danger to Others None reported or observed  ? ? ?

## 2022-01-09 NOTE — Plan of Care (Signed)
  Problem: Education: Goal: Emotional status will improve Outcome: Not Progressing Goal: Mental status will improve Outcome: Not Progressing Goal: Verbalization of understanding the information provided will improve Outcome: Not Progressing   

## 2022-01-09 NOTE — Progress Notes (Signed)
[facility] MD Progress Note  01/09/2022 8:01 PM Misty Howell  MRN:  [id number] Subjective:  Patient is a 56 year old female with a psychiatric history of "anxiety I think that is all" who was admitted to this psychiatric unit from [facility] for evaluation psychotic symptoms and manic symptoms.  On assessment today, the patient remains psychotic and manic without significant change from the day prior. Her psychosis and manic symptoms prevent her from being able to care for herself due to severity of her psychiatric illness. She continues to be aloof and requiring redirection constantly.  She is fixated on her children's safety 'because my mom's ass is small'. She reprots she is the [place] of her home town and therefore does not feel safe here on the unit. Her thoughts are disorganized and illogical. She is paranoid. She reports having AH to 'stay calm'. Her speech is pressured. Speech is tangential with racing thoughts.  Sleep is reported to be improved.  She denies SI and HI. She denies having s/e to current psychiatric medications.  She is A&Ox2 (time and self only).  She has no EPS on my physical exam today.    Collateral from mom 01-08-2022: [name] at [phone] states that last week Pt had not been sleeping well and took her belongings  in her room, she was fidgety, and hide and covered everything. She parked her car in the backyard.  She called 911 multiple times.  When police came they told her that she looked anxious and they observed her for some time. She then took her to the hospital where she was not acting right, she refused treatment and was agitated.  She then started walking outside.  Mom states when she came home, she locked her door and she was up all night talking to herself and making loud sound.  Mom states that patient lost her 64 year old son last year in February 2022 due to drug overdose.  Patient had similar episode 2 years ago and was admitted to facility in  [facility] for 7 to 10 days.  Mom states patient is always hyper and quick to speak her mind.  Mom states she has a history of seizures and had 4-5 seizures.  Mom states she does not sleep more than 3 hours at night.  Updated mom about current medications.  Mom states that she is not able to take care of her at this time.  Principal Problem: Bipolar I disorder, current or most recent episode manic, severe ([address]) Diagnosis: Principal Problem:   Bipolar I disorder, current or most recent episode manic, severe (HCC) Active Problems:   Acute psychosis ([place])   GAD (generalized anxiety disorder)   PTSD (post-traumatic stress disorder)  Total Time spent with patient: 20 minutes  Past Psychiatric History: see H&P  Past Medical History:  Past Medical History:  Diagnosis Date   Anxiety    Cancer ([place])    Giant cell tumor left fifth finger.   Hypercholesterolemia    Overweight(278.02)    Seizures (HCC)    Syncope and collapse    Vision abnormalities    Vitamin D deficiency     Past Surgical History:  Procedure Laterality Date   CESAREAN SECTION     FINGER SURGERY     due to giant cell tumor, hip bone placed in finger   Family History:  Family History  Problem Relation Age of Onset   Hypertension Mother    Migraines Mother    Hypertension Father    Family Psychiatric  History: see H&P Social History:  Social History   Substance and Sexual Activity  Alcohol Use Not Currently   Comment: social     Social History   Substance and Sexual Activity  Drug Use Yes   Types: Marijuana   Comment: Pt stated she used Delta 8 a couple weeks ago    Social History   Socioeconomic History   Marital status: Legally Separated    Spouse name: Not on file   Number of children: Not on file   Years of education: Not on file   Highest education level: Not on file  Occupational History   Occupation: Finance  Tobacco Use   Smoking status: Former    Packs/day: 0.00    Years: 3.00     Pack years: 0.00    Types: Cigarettes    Quit date: 01/20/1989    Years since quitting: 32.9   Smokeless tobacco: Never  Vaping Use   Vaping Use: Never used  Substance and Sexual Activity   Alcohol use: Not Currently    Comment: social   Drug use: Yes    Types: Marijuana    Comment: Pt stated she used Delta 8 a couple weeks ago   Sexual activity: Not Currently  Other Topics Concern   Not on file  Social History Narrative   Pt lives in [place] with mother and two teenaged sons.  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  Not followed by an outpatient psych provider.   Social Determinants of Health   Financial Resource Strain: Not on file  Food Insecurity: Not on file  Transportation Needs: Not on file  Physical Activity: Not on file  Stress: Not on file  Social Connections: Not on file   Additional Social History:                         Sleep: Good  Appetite:  Good  Current Medications: Current Facility-Administered Medications  Medication Dose Route Frequency Provider Last Rate Last Admin   acetaminophen (TYLENOL) tablet 650 mg  650 mg Oral Q6H PRN Ethelene Hal, NP       alum & mag hydroxide-simeth (MAALOX/MYLANTA) 200-200-20 MG/5ML suspension 30 mL  30 mL Oral Q4H PRN Ethelene Hal, NP       divalproex (DEPAKOTE SPRINKLE) capsule 500 mg  500 mg Oral Q12H Rosamae Rocque, Ovid Curd, MD   500 mg at 01/09/22 0801   haloperidol (HALDOL) tablet 2.5 mg  2.5 mg Oral BID Alyxandria Wentz, Ovid Curd, MD       hydrOXYzine (ATARAX) tablet 25 mg  25 mg Oral TID PRN Ethelene Hal, NP   25 mg at 01/09/22 1450   levETIRAcetam (KEPPRA) tablet 500 mg  500 mg Oral BID Zaylia Riolo, Ovid Curd, MD   500 mg at 01/09/22 1840   OLANZapine zydis (ZYPREXA) disintegrating tablet 5 mg  5 mg Oral Q8H PRN Yolanda Huffstetler, Ovid Curd, MD       And   LORazepam (ATIVAN) tablet 1 mg  1 mg Oral PRN Merle Cirelli, Ovid Curd, MD       And   ziprasidone (GEODON) injection 20 mg  20 mg Intramuscular PRN Gavin Faivre,  Ovid Curd, MD       magnesium hydroxide (MILK OF MAGNESIA) suspension 30 mL  30 mL Oral Daily PRN Ethelene Hal, NP       OLANZapine zydis (ZYPREXA) disintegrating tablet 10 mg  10 mg Oral BID Janine Limbo, MD   10 mg at 01/09/22 0801   propranolol (INDERAL)  tablet 10 mg  10 mg Oral TID Janine Limbo, MD   10 mg at 01/09/22 1241   traZODone (DESYREL) tablet 50 mg  50 mg Oral QHS PRN Ethelene Hal, NP   50 mg at 01/08/22 2048   Vitamin D (Ergocalciferol) (DRISDOL) capsule 50,000 Units  50,000 Units Oral Q7 days [name], MD   50,000 Units at 01/08/22 1711    Lab Results:  No results found for this or any previous visit (from the past 48 hour(s)).   Blood Alcohol level:  Lab Results  Component Value Date   ETH <10 01/03/2022   ETH <10 16/08/9603    Metabolic Disorder Labs: Lab Results  Component Value Date   HGBA1C 5.6 01/07/2022   MPG 114.02 01/07/2022   MPG 108.28 08/08/2017   No results found for: PROLACTIN Lab Results  Component Value Date   CHOL 154 01/07/2022   TRIG 153 (H) 01/07/2022   HDL 49 01/07/2022   CHOLHDL 3.1 01/07/2022   VLDL 31 01/07/2022   LDLCALC 74 01/07/2022   LDLCALC 121 (H) 04/13/2009    Physical Findings: AIMS:  , ,  ,  ,    CIWA:    COWS:     Musculoskeletal: Strength & Muscle Tone: within normal limits Gait & Station: normal Patient leans: N/A  Psychiatric Specialty Exam:  Presentation  General Appearance: Disheveled  Eye Contact:Good  Speech:Pressured  Speech Volume:Normal  Handedness:Right   Mood and Affect  Mood:Anxious  Affect:Full Range   Thought Process  Thought Processes:Disorganized  Descriptions of Associations:Tangential  Orientation:Full (Time, Place and Person)  Thought Content:Tangential; Paranoid Ideation  History of Schizophrenia/Schizoaffective disorder:No  Duration of Psychotic Symptoms:Less than six months  Hallucinations:Hallucinations: +AH  Ideas of  Reference:Paranoia  Suicidal Thoughts:Suicidal Thoughts: No  Homicidal Thoughts:Homicidal Thoughts: No   Sensorium  Memory:Immediate Fair; Recent Fair; Remote Fair  Judgment:Poor  Insight:Poor   Executive Functions  Concentration:Poor  Attention Span:Poor  [place]  Language:Good   Psychomotor Activity  Psychomotor Activity:Psychomotor Activity: Restlessness   Assets  Assets:Communication Skills; Resilience; Social Support   Sleep  Sleep:Sleep: Good    Physical Exam: Physical Exam Vitals and nursing note reviewed.  Constitutional:      General: She is not in acute distress.    Appearance: Normal appearance. She is not ill-appearing, toxic-appearing or diaphoretic.  Neurological:     Mental Status: She is alert and oriented to person, place, and time.   Review of Systems  Constitutional:  Negative for chills and fever.  Respiratory:  Negative for cough and shortness of breath.   Cardiovascular:  Negative for chest pain.  Gastrointestinal:  Negative for abdominal pain, constipation, diarrhea, nausea and vomiting.  Neurological:  Negative for dizziness and headaches.  Psychiatric/Behavioral:  Negative for depression, hallucinations and suicidal ideas. The patient is not nervous/anxious.    Blood pressure 112/82, pulse 92, temperature (!) 97.5 F (36.4 C), temperature source Oral, height 5\' 4"  (1.626 m), weight 86.2 kg, SpO2 99 %. Body mass index is 32.61 kg/m.   Treatment Plan Summary:Patient is a 56 year old female with a psychiatric history of "anxiety I think that is all" who was admitted to this psychiatric unit from [facility] for evaluation psychotic symptoms and manic symptoms.  Daily contact with patient to assess and evaluate symptoms and progress in treatment -Bipolar 1 disorder, severe, current episode manic versus brief psychotic disorder versus substance-induced mood disorder versus substance-induced psychotic  disorder -GAD -PTSD -Seizure disorder by history   Psychiatric  Diagnoses and Treatment:  -Start haldol 2.5 mg bid    -Continue Zyprexa  10 mg twice daily for mood stabilization and psychotic thinking. -Continue Keppra 500 mg twice daily for seizure disorder -Continue Depakote DR to Depakote sprinkles 500 mg twice daily for mood stabilization -Continue propranolol 10 mg thrice daily for anxiety and tachycardia   CT head negative in July 2022             -- Metabolic profile and EKG monitoring obtained while on an atypical antipsychotic (BMI: Lipid Panel: HbgA1c: QTc:) 413                          3. Medical Issues Being Addressed:              Tobacco Use Disorder             -- Nicotine patch 21mg /24 hours ordered             -- Smoking cessation encouraged              Vitamin D deficiency              -Continue vitamin D 50,000 Iunits weekly for 6 weeks.  4. Discharge Planning: Social work and case management to assist with discharge planning and identification of hospital follow-up needs prior to discharge.  Per mom, pt cannot return home to live with her.   Christoper Allegra, MD 01/09/2022, 8:01 PM  Total Time Spent in Direct Patient Care:  I personally spent 30 minutes on the unit in direct patient care. The direct patient care time included face-to-face time with the patient, reviewing the patient's chart, communicating with other professionals, and coordinating care. Greater than 50% of this time was spent in counseling or coordinating care with the patient regarding goals of hospitalization, psycho-education, and discharge planning needs.   Janine Limbo, MD Psychiatrist

## 2022-01-09 NOTE — Group Note (Signed)
Recreation Therapy Group Note ? ? ?Group Topic:Team Building  ?Group Date: 01/09/2022 ?Start Time: [phone] ?End Time: 1030 ?Facilitators: Victorino Sparrow, LRT,CTRS ?Location: [place] ? ? ?Goal Area(s) Addresses:  ?Patient will effectively work with peer towards shared goal.  ?Patient will identify skills used to make activity successful.  ?Patient will identify how skills used during activity can be used to reach post d/c goals.  ? ?Group Description: Landing Pad. In teams of 3-5, patients were given 12 plastic drinking straws and an equal length of masking tape. Using the materials provided, patients were asked to build a landing pad to catch a golf ball dropped from approximately 5 feet in the air. All materials were required to be used by the team in their design. LRT facilitated post-activity discussion. ? ? ?Affect/Mood: Flat and Manic ?  ?Participation Level: Moderate ?  ?Participation Quality: Independent ?  ?Behavior: Distracted, Hyperverbal, and Off-task ?  ?Speech/Thought Process: Disorganized and Delusional ?  ?Insight: Lacking ?  ?Judgement: Lacking  ?  ?Modes of Intervention: Team-building ?  ?Patient Response to Interventions:  Challenging  ?  ?Education Outcome: ? Acknowledges education and In group clarification offered   ? ?Clinical Observations/Individualized Feedback: Pt was unable to comprehend the instructions of the activity.  Pt was to help peer build a landing contraption for a golf ball.  Instead, pt was talking about using the straws to build a deck to sit out and look at bears.  Pt went even further describing the straws as something to build an entire house with.  Pt started playing with UNO cards and needed redirection from the cards and to pay attention to the activity.  Pt also described the structure as something to use if her family ran out of food and water.  They could use the contraption to catch fish so they could survive.   ? ?Plan: Continue to engage patient in RT group  sessions 2-3x/week. ? ? ?Victorino Sparrow, LRT,CTRS ?01/09/2022 11:36 AM ?

## 2022-01-09 NOTE — Progress Notes (Signed)
Pt was encouraged but didn't attend therapeutic relaxation group. ?

## 2022-01-09 NOTE — Progress Notes (Signed)
Adult Psychoeducational Group Note ? ?Date:  01/09/2022 ?Time:  9:00 PM ? ?Group Topic/Focus:  ?Wrap-Up Group:   The focus of this group is to help patients review their daily goal of treatment and discuss progress on daily workbooks. ? ?Participation Level:  Active ? ?Participation Quality:  Appropriate ? ?Affect:  Anxious, Defensive, Depressed, and Irritable ? ?Cognitive:  Disorganized, Confused, and Delusional ? ?Insight: Lacking and Limited ? ?Engagement in Group:  Lacking, Limited, Off Topic, and Poor ? ?Modes of Intervention:  Discussion ? ?Additional Comments:  Pt stated her goal for today was to focus on her treatment plan. Pt stated she accomplished her goal today. Pt stated she was able to talked  with her doctor but did not get a chance to speak with her social worker about her care today. Pt rated her overall day a 2 out of 10. Pt stated she feels like everyone is laughing and making jokes about her. Pt stated she was able to contact her father and her mother today which improved her overall day. Pt stated she felt better about herself tonight. Pt stated staff brought back all her meals today because she is on unit restriction. Pt stated she took all medications provided today. Pt stated her appetite was pretty good today. Pt rated her sleep last night was pretty good. Pt stated the goal tonight was to get some rest. Pt stated she had some physical pain tonight. Pt stated she had some moderate pain in her left arm. Pt rated the moderate pain in her left arm a 5 on the pain level scale. Pt nurse was updated on the situation. Pt deny visual hallucinations and auditory issues tonight. Pt denies thoughts of harming herself or others. Pt stated she would alert staff if anything changed. ? ?Misty Howell ?01/09/2022, 9:00 PM ?

## 2022-01-09 NOTE — [place] FL2 (Signed)
?[place] MEDICAID FL2 LEVEL OF CARE SCREENING TOOL  ?  ? ?IDENTIFICATION  ?Patient Name: ?Misty Howell Birthdate: January 07, 1966 Sex: female Admission Date (Current Location): ?01/06/2022  ?[place] and [place] Number: ? [place] ?None Facility and Address:  ?[facility] [place] [address] [place], [place] [address] ?     Provider Number: ?[id number]  ?Attending Physician Name and Address:  ?Maida Sale, MD ? Relative Name and Phone Number:  ?Earlie Server [phone]) and Mother, [name] [phone]) ?   ?Current Level of Care: ?Hospital Recommended Level of Care: ?[place], [facility] Prior Approval Number: ?  ? ?Date Approved/Denied: ?  PASRR Number: ?  ? ?Discharge Plan: ?Other (Comment) (ALF/FCH) ?  ? ?Current Diagnoses: ?Patient Active Problem List  ? Diagnosis Date Noted  ? Bipolar I disorder, current or most recent episode manic, severe ([place]) 01/07/2022  ? GAD (generalized anxiety disorder) 01/07/2022  ? PTSD (post-traumatic stress disorder) 01/07/2022  ? Brief psychotic disorder ([place]) 01/06/2022  ? Acute psychosis ([place]) 01/06/2022  ? Hyperglycemia 08/08/2017  ? Hypokalemia 08/07/2017  ? Seizure ([place]) 11/24/2013  ? Collapse 11/24/2013  ? Vitamin D deficiency 07/06/2010  ? Hyperlipidemia 07/05/2010  ? OVERWEIGHT 03/31/2008  ? Anxiety state 03/30/2008  ? ? ?Orientation RESPIRATION BLADDER Height & Weight   ?  ?Self, Time, Situation, Place ? Normal Continent Weight: 86.2 kg ?Height:  5\' 4"  (162.6 cm)  ?BEHAVIORAL SYMPTOMS/MOOD NEUROLOGICAL BOWEL NUTRITION STATUS  ?  Convulsions/Seizures Continent Diet (Normal)  ?AMBULATORY STATUS COMMUNICATION OF NEEDS Skin   ?Independent Verbally Normal ?  ?  ?  ?    ?     ?     ? ? ?Personal Care Assistance Level of Assistance  ?Bathing, Feeding, Dressing, Total care Bathing Assistance: Independent ?Feeding assistance: Independent ?Dressing Assistance: Independent ?Total Care Assistance: Independent   ? ?Functional Limitations Info  ?Sight, Hearing, Speech Sight Info: Adequate ?Hearing Info: Adequate ?Speech Info: Adequate  ? ? ?SPECIAL CARE FACTORS FREQUENCY  ?    ?  ?  ?  ?  ?  ?  ?   ? ? ?Contractures Contractures Info: Not present  ? ? ?Additional Factors Info  ?Code Status, Allergies Code Status Info: FULL ?Allergies Info: No Known Allergies ?  ?  ?  ?   ? ?Current Medications (01/09/2022):  This is the current hospital active medication list ?Current Facility-Administered Medications  ?Medication Dose Route Frequency Provider Last Rate Last Admin  ? acetaminophen (TYLENOL) tablet 650 mg  650 mg Oral Q6H PRN Ethelene Hal, NP      ? alum & mag hydroxide-simeth (MAALOX/MYLANTA) 200-200-20 MG/5ML suspension 30 mL  30 mL Oral Q4H PRN Ethelene Hal, NP      ? divalproex (DEPAKOTE SPRINKLE) capsule 500 mg  500 mg Oral Q12H Massengill, Nathan, MD   500 mg at 01/09/22 3546  ? hydrOXYzine (ATARAX) tablet 25 mg  25 mg Oral TID PRN Ethelene Hal, NP   25 mg at 01/08/22 2050  ? levETIRAcetam (KEPPRA) tablet 500 mg  500 mg Oral BID Janine Limbo, MD   500 mg at 01/09/22 0800  ? OLANZapine zydis (ZYPREXA) disintegrating tablet 5 mg  5 mg Oral Q8H PRN Massengill, Nathan, MD      ? And  ? LORazepam (ATIVAN) tablet 1 mg  1 mg Oral PRN Massengill, Nathan, MD      ? And  ? ziprasidone (GEODON) injection 20 mg  20 mg  Intramuscular PRN Massengill, Ovid Curd, MD      ? magnesium hydroxide (MILK OF MAGNESIA) suspension 30 mL  30 mL Oral Daily PRN Ethelene Hal, NP      ? OLANZapine zydis (ZYPREXA) disintegrating tablet 10 mg  10 mg Oral BID Janine Limbo, MD   10 mg at 01/09/22 0801  ? propranolol (INDERAL) tablet 10 mg  10 mg Oral TID Janine Limbo, MD   10 mg at 01/09/22 0801  ? traZODone (DESYREL) tablet 50 mg  50 mg Oral QHS PRN Ethelene Hal, NP   50 mg at 01/08/22 2048  ? Vitamin D (Ergocalciferol) (DRISDOL) capsule 50,000 Units  50,000 Units Oral Q7 days [name], MD    50,000 Units at 01/08/22 1711  ? ? ? ?Discharge Medications: ?Please see discharge summary for a list of discharge medications. ? ?Relevant Imaging Results: ? ?Relevant Lab Results: ? ? ?Additional Information ?SSN: [id number] ? ?Vassie Moselle, LCSW ? ? ? ? ?

## 2022-01-09 NOTE — Progress Notes (Signed)
? ? ?   01/08/22 2050  ?Psych Admission Type (Psych Patients Only)  ?Admission Status Involuntary  ?Psychosocial Assessment  ?Patient Complaints Anxiety;Depression;Worrying  ?Eye Contact Glaring  ?Facial Expression Anxious;Worried  ?Affect Anxious;Preoccupied  ?Speech Tangential;Pressured  ?Interaction Assertive  ?Motor Activity Pacing;Restless;Fidgety  ?Appearance/Hygiene Disheveled  ?Behavior Characteristics Cooperative;Anxious  ?Mood Preoccupied  ?Thought Process  ?Coherency Disorganized  ?Content Preoccupation  ?Delusions None reported or observed  ?Perception Hallucinations  ?Hallucination Auditory  ?Judgment Impaired  ?Confusion Moderate  ?Danger to Self  ?Current suicidal ideation? Denies  ?Danger to Others  ?Danger to Others None reported or observed  ? ? ?

## 2022-01-09 NOTE — BHH Counselor (Signed)
CSW faxed patients FL-2 to [facility] in [place] for possible placement. ? ? ? ? ? ?Darletta Moll MSW, LCSW ?Clincal Social Worker  ?[facility]  ?

## 2022-01-09 NOTE — Plan of Care (Signed)
  Problem: Activity: Goal: Interest or engagement in activities will improve Outcome: Progressing   Problem: Coping: Goal: Ability to demonstrate self-control will improve Outcome: Progressing   Problem: Safety: Goal: Periods of time without injury will increase Outcome: Progressing   

## 2022-01-10 MED ORDER — HALOPERIDOL 5 MG PO TABS
5.0000 mg | ORAL_TABLET | Freq: Two times a day (BID) | ORAL | Status: DC
  Administered 2022-01-10 – 2022-01-17 (×14): 5 mg via ORAL
  Filled 2022-01-10 (×16): qty 1

## 2022-01-10 MED ORDER — PROPRANOLOL HCL ER 60 MG PO CP24
60.0000 mg | ORAL_CAPSULE | Freq: Every day | ORAL | Status: DC
  Administered 2022-01-11 – 2022-01-17 (×7): 60 mg via ORAL
  Filled 2022-01-10 (×9): qty 1

## 2022-01-10 MED ORDER — LORAZEPAM 0.5 MG PO TABS
0.5000 mg | ORAL_TABLET | Freq: Two times a day (BID) | ORAL | Status: DC
  Administered 2022-01-10 – 2022-01-14 (×9): 0.5 mg via ORAL
  Filled 2022-01-10 (×9): qty 1

## 2022-01-10 MED ORDER — PROPRANOLOL HCL 20 MG PO TABS
20.0000 mg | ORAL_TABLET | Freq: Three times a day (TID) | ORAL | Status: AC
  Administered 2022-01-10 (×2): 20 mg via ORAL
  Filled 2022-01-10 (×2): qty 1

## 2022-01-10 NOTE — BHH Counselor (Signed)
CSW spoke with this patients mother who shared that she and this pt's  sister are working on completing guardianship paperwork for this patient. CSW had a transparent conversation with patients mother regarding resources and likely hood of placement for this patient with lack of income and insurance. Pt's mother is to call social services to find out why Medicaid paperwork has not been faxed to CSW for this patient. ? ? ?Darletta Moll MSW, LCSW ?Clincal Social Worker  ?[facility]  ?

## 2022-01-10 NOTE — Progress Notes (Signed)
[facility] MD Progress Note  01/10/2022 12:58 PM Misty Howell  MRN:  [id number] Subjective:  Patient is a 56 year old female with a psychiatric history of "anxiety I think that is all" who was admitted to this psychiatric unit from [facility] for evaluation psychotic symptoms and manic symptoms.  Per nursing: pt is illogical an disorganized. She was trying to leaving believing that her exhusband is outside. And pt is worried that her son was coming here to the unit last night.   On assessment today, the patient remains psychotic and manic.her speech is some less pressured but she continues to demonstrate racing thoughts, disorganized thoughts, and tangential speech.  Her psychosis and manic symptoms prevent her from being able to care for herself due to severity of her psychiatric illness.  Pt continues to have paranoid thinking. She also believes that her son, who she acknowledges passed away, is actually living because she saw him recently.  She realizes taht she is confused. She reports that Ah persists telling her 'you're making bad choices'. She reports that mood is down, depressed, sad. She reports that anxiety level is very high.  Denies having suicidal thoughts, passive, without intent or plan.  Denies HI.   Denies having s/e to current psychiatric prescribed medications.   She is A&Ox2 (time and self only).  She has no EPS on my physical exam today.    Collateral from mom 01-08-2022: [name] at [phone] states that last week Pt had not been sleeping well and took her belongings  in her room, she was fidgety, and hide and covered everything. She parked her car in the backyard.  She called 911 multiple times.  When police came they told her that she looked anxious and they observed her for some time. She then took her to the hospital where she was not acting right, she refused treatment and was agitated.  She then started walking outside.  Mom states when she came home, she locked  her door and she was up all night talking to herself and making loud sound.  Mom states that patient lost her 16 year old son last year in February 2022 due to drug overdose.  Patient had similar episode 2 years ago and was admitted to facility in [facility] for 7 to 10 days.  Mom states patient is always hyper and quick to speak her mind.  Mom states she has a history of seizures and had 4-5 seizures.  Mom states she does not sleep more than 3 hours at night.  Updated mom about current medications.  Mom states that she is not able to take care of her at this time.  Principal Problem: Bipolar I disorder, current or most recent episode manic, severe ([place]) Diagnosis: Principal Problem:   Bipolar I disorder, current or most recent episode manic, severe ([facility]) Active Problems:   Acute psychosis ([place])   GAD (generalized anxiety disorder)   PTSD (post-traumatic stress disorder)  Total Time spent with patient: 20 minutes  Past Psychiatric History: see H&P  Past Medical History:  Past Medical History:  Diagnosis Date   Anxiety    Cancer ([place])    Giant cell tumor left fifth finger.   Hypercholesterolemia    Overweight(278.02)    Seizures ([facility])    Syncope and collapse    Vision abnormalities    Vitamin D deficiency     Past Surgical History:  Procedure Laterality Date   CESAREAN SECTION     FINGER SURGERY     due to giant  cell tumor, hip bone placed in finger   Family History:  Family History  Problem Relation Age of Onset   Hypertension Mother    Migraines Mother    Hypertension Father    Family Psychiatric  History: see H&P Social History:  Social History   Substance and Sexual Activity  Alcohol Use Not Currently   Comment: social     Social History   Substance and Sexual Activity  Drug Use Yes   Types: Marijuana   Comment: Pt stated she used Delta 8 a couple weeks ago    Social History   Socioeconomic History   Marital status: Legally Separated    Spouse name:  Not on file   Number of children: Not on file   Years of education: Not on file   Highest education level: Not on file  Occupational History   Occupation: Finance  Tobacco Use   Smoking status: Former    Packs/day: 0.00    Years: 3.00    Pack years: 0.00    Types: Cigarettes    Quit date: 01/20/1989    Years since quitting: 32.9   Smokeless tobacco: Never  Vaping Use   Vaping Use: Never used  Substance and Sexual Activity   Alcohol use: Not Currently    Comment: social   Drug use: Yes    Types: Marijuana    Comment: Pt stated she used Delta 8 a couple weeks ago   Sexual activity: Not Currently  Other Topics Concern   Not on file  Social History Narrative   Pt lives in [place] with mother and two teenaged sons.  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  Not followed by an outpatient psych provider.   Social Determinants of Health   Financial Resource Strain: Not on file  Food Insecurity: Not on file  Transportation Needs: Not on file  Physical Activity: Not on file  Stress: Not on file  Social Connections: Not on file   Additional Social History:                         Sleep: Good  Appetite:  Good  Current Medications: Current Facility-Administered Medications  Medication Dose Route Frequency Provider Last Rate Last Admin   acetaminophen (TYLENOL) tablet 650 mg  650 mg Oral Q6H PRN Ethelene Hal, NP       alum & mag hydroxide-simeth (MAALOX/MYLANTA) 200-200-20 MG/5ML suspension 30 mL  30 mL Oral Q4H PRN Ethelene Hal, NP       divalproex (DEPAKOTE SPRINKLE) capsule 500 mg  500 mg Oral Q12H Reginald Mangels, Ovid Curd, MD   500 mg at 01/10/22 0847   haloperidol (HALDOL) tablet 5 mg  5 mg Oral BID Rayvn Rickerson, Ovid Curd, MD       hydrOXYzine (ATARAX) tablet 25 mg  25 mg Oral TID PRN Ethelene Hal, NP   25 mg at 01/10/22 0848   levETIRAcetam (KEPPRA) tablet 500 mg  500 mg Oral BID Shakeita Vandevander, Ovid Curd, MD   500 mg at 01/10/22 0847   LORazepam (ATIVAN) tablet  0.5 mg  0.5 mg Oral BID Meleane Selinger, Ovid Curd, MD       OLANZapine zydis (ZYPREXA) disintegrating tablet 5 mg  5 mg Oral Q8H PRN Monserrate Blaschke, Ovid Curd, MD       And   LORazepam (ATIVAN) tablet 1 mg  1 mg Oral PRN Indya Oliveria, MD       And   ziprasidone (GEODON) injection 20 mg  20 mg Intramuscular PRN  Lynnie Koehler, Ovid Curd, MD       magnesium hydroxide (MILK OF MAGNESIA) suspension 30 mL  30 mL Oral Daily PRN Ethelene Hal, NP       OLANZapine zydis (ZYPREXA) disintegrating tablet 10 mg  10 mg Oral BID Zeda Gangwer, Ovid Curd, MD   10 mg at 01/10/22 0847   propranolol (INDERAL) tablet 20 mg  20 mg Oral TID Janine Limbo, MD   20 mg at 01/10/22 1205   [START ON 01/11/2022] propranolol ER (INDERAL LA) 24 hr capsule 60 mg  60 mg Oral Daily Chelsae Zanella, MD       traZODone (DESYREL) tablet 50 mg  50 mg Oral QHS PRN Ethelene Hal, NP   50 mg at 01/09/22 2044   Vitamin D (Ergocalciferol) (DRISDOL) capsule 50,000 Units  50,000 Units Oral Q7 days [name], MD   50,000 Units at 01/08/22 1711    Lab Results:  No results found for this or any previous visit (from the past 48 hour(s)).   Blood Alcohol level:  Lab Results  Component Value Date   ETH <10 01/03/2022   ETH <10 51/12/5850    Metabolic Disorder Labs: Lab Results  Component Value Date   HGBA1C 5.6 01/07/2022   MPG 114.02 01/07/2022   MPG 108.28 08/08/2017   No results found for: PROLACTIN Lab Results  Component Value Date   CHOL 154 01/07/2022   TRIG 153 (H) 01/07/2022   HDL 49 01/07/2022   CHOLHDL 3.1 01/07/2022   VLDL 31 01/07/2022   LDLCALC 74 01/07/2022   LDLCALC 121 (H) 04/13/2009    Physical Findings: AIMS:  , ,  ,  ,   aims score zero on 01-10-2022  CIWA:    COWS:     Musculoskeletal: Strength & Muscle Tone: within normal limits Gait & Station: normal Patient leans: N/A  Psychiatric Specialty Exam:  Presentation  General Appearance: Disheveled  Eye  Contact:Good  Speech:Pressured  Speech Volume:Normal  Handedness:Right   Mood and Affect  Mood: Anxious, depressed  Affect:Full Range   Thought Process  Thought Processes:Disorganized  Descriptions of Associations:Tangential  Orientation:Full (Time, Place and Person)  Thought Content:Tangential; Paranoid Ideation  History of Schizophrenia/Schizoaffective disorder:No  Duration of Psychotic Symptoms:Less than six months  Hallucinations:Hallucinations: +AH  Ideas of Reference:Paranoia  Suicidal Thoughts:denies  Homicidal Thoughts:denies   Sensorium  Memory:Immediate Fair; Recent Fair; Remote Fair  Judgment:Poor  Insight:Poor   Executive Functions  Concentration:Poor  Attention Span:Poor  [place]  Language:Good   Psychomotor Activity  Psychomotor Activity:psychomotor agitation    Assets  Assets:Communication Skills; Resilience; Social Support   Sleep  Sleep: improving     Physical Exam: Physical Exam Vitals and nursing note reviewed.  Constitutional:      General: She is not in acute distress.    Appearance: Normal appearance. She is not ill-appearing, toxic-appearing or diaphoretic.  Neurological:     Mental Status: She is alert and oriented to person, place, and time.   Review of Systems  Constitutional:  Negative for chills and fever.  Respiratory:  Negative for cough and shortness of breath.   Cardiovascular:  Negative for chest pain.  Gastrointestinal:  Negative for abdominal pain, constipation, diarrhea, nausea and vomiting.  Neurological:  Negative for dizziness and headaches.  Psychiatric/Behavioral:  Negative for depression, hallucinations and suicidal ideas. The patient is not nervous/anxious.    Blood pressure 121/84, pulse 96, temperature 98.4 F (36.9 C), temperature source Oral, resp. rate 20, height 5\' 4"  (1.626 m),  weight 86.2 kg, SpO2 97 %. Body mass index is 32.61 kg/m.   Treatment  Plan Summary:Patient is a 56 year old female with a psychiatric history of "anxiety I think that is all" who was admitted to this psychiatric unit from [facility] for evaluation psychotic symptoms and manic symptoms.  Daily contact with patient to assess and evaluate symptoms and progress in treatment -Bipolar 1 disorder, severe, current episode manic versus brief psychotic disorder versus substance-induced mood disorder versus substance-induced psychotic disorder -GAD -PTSD -Seizure disorder by history   Psychiatric Diagnoses and Treatment:  -Start haldol 2.5 mg bid    -Continue Zyprexa  10 mg twice daily for mood stabilization and psychotic thinking. -Continue Keppra 500 mg twice daily for seizure disorder -Continue Depakote DR to Depakote sprinkles 500 mg twice daily for mood stabilization. VA level ordered for Monday morning 01-13-22. -Increase propranolol  to ER 60 mg once daily for anxiety and tachycardia -increase haldol to 5 mg bid  -start ativan 0.5 mg bid for anxiety   CT head negative in July 2022             -- Metabolic profile and EKG monitoring obtained while on an atypical antipsychotic (BMI: Lipid Panel: HbgA1c: QTc:) 413                          3. Medical Issues Being Addressed:              Tobacco Use Disorder             -- Nicotine patch 21mg /24 hours ordered             -- Smoking cessation encouraged              Vitamin D deficiency              -Continue vitamin D 50,000 Iunits weekly for 6 weeks.  4. Discharge Planning: Social work and case management to assist with discharge planning and identification of hospital follow-up needs prior to discharge.  Per mom, pt cannot return home to live with her.   Christoper Allegra, MD 01/10/2022, 12:58 PM  Total Time Spent in Direct Patient Care:  I personally spent 30 minutes on the unit in direct patient care. The direct patient care time included face-to-face time with the patient, reviewing the patient's chart,  communicating with other professionals, and coordinating care. Greater than 50% of this time was spent in counseling or coordinating care with the patient regarding goals of hospitalization, psycho-education, and discharge planning needs.   Janine Limbo, MD Psychiatrist

## 2022-01-10 NOTE — Progress Notes (Signed)
Patient did not attend morning orientation group.  

## 2022-01-10 NOTE — Group Note (Signed)
Recreation Therapy Group Note ? ? ?Group Topic:Problem Solving  ?Group Date: 01/10/2022 ?Start Time: 1010 ?End Time: 1055 ?Facilitators: Victorino Sparrow, LRT,CTRS ?Location: [address] ? ? ?Goal Area(s) Addresses:  ?Patient will effectively work in a team with other group members. ?Patient will verbalize importance of using appropriate problem solving techniques.  ?Patient will identify positive change associated with effective problem solving skills.  ? ?Group Description:  Brain Teasers.  Patients were given two sheets of brain teasers to fill out.  Patients started off working individually but eventually worked together to come up with the answers for the harder ones.  Patients were to concentrate and focus in order to come up with the correct answers. ? ? ?Affect/Mood: N/A ?  ?Participation Level: Did not attend ?  ? ?Clinical Observations/Individualized Feedback:   ? ? ?Plan: Continue to engage patient in RT group sessions 2-3x/week. ? ? ?Victorino Sparrow, LRT,CTRS ?01/10/2022 1:09 PM ?

## 2022-01-10 NOTE — Progress Notes (Signed)
?   01/09/22 2044  ?Psych Admission Type (Psych Patients Only)  ?Admission Status Involuntary  ?Psychosocial Assessment  ?Patient Complaints Anxiety;Crying spells;Nervousness  ?Eye Contact Glaring  ?Facial Expression Anxious;Worried  ?Affect Anxious;Labile;Preoccupied  ?Speech Tangential;Pressured  ?Interaction Assertive  ?Motor Activity Fidgety;Pacing;Restless  ?Appearance/Hygiene Disheveled  ?Behavior Characteristics Cooperative;Anxious  ?Mood Preoccupied;Helpless  ?Thought Process  ?Coherency Disorganized  ?Content Preoccupation  ?Delusions None reported or observed  ?Perception Hallucinations  ?Hallucination Auditory  ?Judgment Impaired  ?Confusion Moderate  ?Danger to Self  ?Current suicidal ideation? Denies  ?Danger to Others  ?Danger to Others None reported or observed  ? ? ?

## 2022-01-10 NOTE — Plan of Care (Signed)
?  Problem: Health Behavior/Discharge Planning: ?Goal: Compliance with treatment plan for underlying cause of condition will improve ?Outcome: Progressing ?  ?Problem: Education: ?Goal: Emotional status will improve ?Outcome: Not Progressing ?Goal: Mental status will improve ?Outcome: Not Progressing ?  ?

## 2022-01-10 NOTE — Progress Notes (Signed)
Adult Psychoeducational Group Note ? ?Date:  01/10/2022 ?Time:  8:35 PM ? ?Group Topic/Focus:  ?Wrap-Up Group:   The focus of this group is to help patients review their daily goal of treatment and discuss progress on daily workbooks. ? ?Participation Level:  Did Not Attend ? ?Participation Quality:   Did Not Attend ? ?Affect:   Did Not Attend ? ?Cognitive:   Did Not Attend ? ?Insight: None ? ?Engagement in Group:   Did Not Attend ? ?Modes of Intervention:   Did Not Attend ? ?Additional Comments:  Pt was encouraged to attend wrap up group but did not attend. ? ?Candy Sledge ?01/10/2022, 8:35 PM ?

## 2022-01-10 NOTE — Group Note (Signed)
LCSW Group Therapy Note ? ?Group Date: 01/10/2022 ?Start Time: 1100 ?End Time: 1200 ? ? ?Type of Therapy and Topic:  Group Therapy - Healthy vs Unhealthy Coping Skills ? ?Participation Level:  Minimal  ? ?Description of Group ?The focus of this group was to determine what unhealthy coping techniques typically are used by group members and what healthy coping techniques would be helpful in coping with various problems. Patients were guided in becoming aware of the differences between healthy and unhealthy coping techniques. Patients were asked to identify 2-3 healthy coping skills they would like to learn to use more effectively. ? ?Therapeutic Goals ?Patients learned that coping is what human beings do all day long to deal with various situations in their lives ?Patients defined and discussed healthy vs unhealthy coping techniques ?Patients identified their preferred coping techniques and identified whether these were healthy or unhealthy ?Patients determined 2-3 healthy coping skills they would like to become more familiar with and use more often. ?Patients provided support and ideas to each other ? ? ?Summary of Patient Progress:  Patient came and participated in group. Patient was paranoid that people were erasing things off of her paper. She was able to share that drawing, listening to music, reading, and organizing are ways she copes. ? ? ?Therapeutic Modalities ?Cognitive Behavioral Therapy ?Motivational Interviewing ? ?Vassie Moselle, LCSW ?01/10/2022  11:38 AM   ?

## 2022-01-11 NOTE — Progress Notes (Signed)
[facility] MD Progress Note  01/11/2022 11:14 AM Misty Howell  MRN:  [id number]  Subjective: Misty Howell reported " I really miss my son, I thought I saw him recently and they are trying to keep him from me. "  Misty Howell was seen and evaluated faced to face.  Patient observed sitting on the side of the bed.  Slightly guarded on approach but pleasant.   "  I know my husband killed my son, He wants to keep it quiet so he is able to when in the election."   Presents with delusional, disorganized and slight pressured speech. She is denying depression or depressive symptoms.  Does reports ongoing symptoms of worry related to her son's wellbeing.  Staff reported patient has been taking medications as directed.  Continues to present with paranoia denying suicidal homicidal ideations.  Denies visual hallucinations.  Reports auditory " static" continue with current medication regiment.  Orders placed for EKG. valproic acid level pending. Support, encouragement and reassurance was provided.   Principal Problem: Bipolar I disorder, current or most recent episode manic, severe ([place]) Diagnosis: Principal Problem:   Bipolar I disorder, current or most recent episode manic, severe ([place]) Active Problems:   Acute psychosis ([place])   GAD (generalized anxiety disorder)   PTSD (post-traumatic stress disorder)  Total Time spent with patient: 15 minutes  Past Psychiatric History:   Past Medical History:  Past Medical History:  Diagnosis Date   Anxiety    Cancer ([place])    Giant cell tumor left fifth finger.   Hypercholesterolemia    Overweight(278.02)    Seizures ([facility])    Syncope and collapse    Vision abnormalities    Vitamin D deficiency     Past Surgical History:  Procedure Laterality Date   CESAREAN SECTION     FINGER SURGERY     due to giant cell tumor, hip bone placed in finger   Family History:  Family History  Problem Relation Age of Onset   Hypertension Mother    Migraines Mother    Hypertension  Father    Family Psychiatric  History:  Social History:  Social History   Substance and Sexual Activity  Alcohol Use Not Currently   Comment: social     Social History   Substance and Sexual Activity  Drug Use Yes   Types: Marijuana   Comment: Pt stated she used Delta 8 a couple weeks ago    Social History   Socioeconomic History   Marital status: Legally Separated    Spouse name: Not on file   Number of children: Not on file   Years of education: Not on file   Highest education level: Not on file  Occupational History   Occupation: Finance  Tobacco Use   Smoking status: Former    Packs/day: 0.00    Years: 3.00    Pack years: 0.00    Types: Cigarettes    Quit date: 01/20/1989    Years since quitting: 32.9   Smokeless tobacco: Never  Vaping Use   Vaping Use: Never used  Substance and Sexual Activity   Alcohol use: Not Currently    Comment: social   Drug use: Yes    Types: Marijuana    Comment: Pt stated she used Delta 8 a couple weeks ago   Sexual activity: Not Currently  Other Topics Concern   Not on file  Social History Narrative   Pt lives in [place] with mother and two teenaged sons.  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  Not followed by an outpatient psych provider.   Social Determinants of Health   Financial Resource Strain: Not on file  Food Insecurity: Not on file  Transportation Needs: Not on file  Physical Activity: Not on file  Stress: Not on file  Social Connections: Not on file   Additional Social History:                         Sleep: Fair  Appetite:  Fair  Current Medications: Current Facility-Administered Medications  Medication Dose Route Frequency Provider Last Rate Last Admin   acetaminophen (TYLENOL) tablet 650 mg  650 mg Oral Q6H PRN Ethelene Hal, NP       alum & mag hydroxide-simeth (MAALOX/MYLANTA) 200-200-20 MG/5ML suspension 30 mL  30 mL Oral Q4H PRN Ethelene Hal, NP       divalproex (DEPAKOTE  SPRINKLE) capsule 500 mg  500 mg Oral Q12H Massengill, Ovid Curd, MD   500 mg at 01/11/22 0569   haloperidol (HALDOL) tablet 5 mg  5 mg Oral BID Janine Limbo, MD   5 mg at 01/11/22 7948   hydrOXYzine (ATARAX) tablet 25 mg  25 mg Oral TID PRN Ethelene Hal, NP   25 mg at 01/10/22 0848   levETIRAcetam (KEPPRA) tablet 500 mg  500 mg Oral BID Massengill, Ovid Curd, MD   500 mg at 01/11/22 0165   LORazepam (ATIVAN) tablet 0.5 mg  0.5 mg Oral BID Massengill, Ovid Curd, MD   0.5 mg at 01/11/22 0808   OLANZapine zydis (ZYPREXA) disintegrating tablet 5 mg  5 mg Oral Q8H PRN Massengill, Ovid Curd, MD       And   LORazepam (ATIVAN) tablet 1 mg  1 mg Oral PRN Massengill, Ovid Curd, MD       And   ziprasidone (GEODON) injection 20 mg  20 mg Intramuscular PRN Massengill, Ovid Curd, MD       magnesium hydroxide (MILK OF MAGNESIA) suspension 30 mL  30 mL Oral Daily PRN Ethelene Hal, NP       OLANZapine zydis (ZYPREXA) disintegrating tablet 10 mg  10 mg Oral BID Massengill, Ovid Curd, MD   10 mg at 01/11/22 5374   propranolol ER (INDERAL LA) 24 hr capsule 60 mg  60 mg Oral Daily Massengill, Ovid Curd, MD   60 mg at 01/11/22 0808   traZODone (DESYREL) tablet 50 mg  50 mg Oral QHS PRN Ethelene Hal, NP   50 mg at 01/10/22 2118   Vitamin D (Ergocalciferol) (DRISDOL) capsule 50,000 Units  50,000 Units Oral Q7 days [name], MD   50,000 Units at 01/08/22 1711    Lab Results: No results found for this or any previous visit (from the past 48 hour(s)).  Blood Alcohol level:  Lab Results  Component Value Date   ETH <10 01/03/2022   ETH <10 82/70/7867    Metabolic Disorder Labs: Lab Results  Component Value Date   HGBA1C 5.6 01/07/2022   MPG 114.02 01/07/2022   MPG 108.28 08/08/2017   No results found for: PROLACTIN Lab Results  Component Value Date   CHOL 154 01/07/2022   TRIG 153 (H) 01/07/2022   HDL 49 01/07/2022   CHOLHDL 3.1 01/07/2022   VLDL 31 01/07/2022   LDLCALC 74 01/07/2022    LDLCALC 121 (H) 04/13/2009    Physical Findings: AIMS: Facial and Oral Movements Muscles of Facial Expression: None, normal Lips and Perioral Area: None, normal Jaw: None, normal Tongue: None, normal,Extremity  Movements Upper (arms, wrists, hands, fingers): None, normal Lower (legs, knees, ankles, toes): None, normal, Trunk Movements Neck, shoulders, hips: None, normal, Overall Severity Severity of abnormal movements (highest score from questions above): None, normal Incapacitation due to abnormal movements: None, normal Patient's awareness of abnormal movements (rate only patient's report): No Awareness, Dental Status Current problems with teeth and/or dentures?: No Does patient usually wear dentures?: No  CIWA:    COWS:     Musculoskeletal: Strength & Muscle Tone: within normal limits Gait & Station: normal Patient leans: N/A  Psychiatric Specialty Exam:  Presentation  General Appearance: Disheveled  Eye Contact:Good  Speech:Pressured  Speech Volume:Normal  Handedness:Right   Mood and Affect  Mood:Anxious  Affect:Full Range   Thought Process  Thought Processes:Disorganized  Descriptions of Associations:Tangential  Orientation:Full (Time, Place and Person)  Thought Content:Tangential; Paranoid Ideation  History of Schizophrenia/Schizoaffective disorder:No  Duration of Psychotic Symptoms:Less than six months  Hallucinations:No data recorded Ideas of Reference:Paranoia  Suicidal Thoughts:No data recorded Homicidal Thoughts:No data recorded  Sensorium  Memory:Immediate Fair; Recent Fair; Remote Fair  Judgment:Poor  Insight:Poor   Executive Functions  Concentration:Poor  Attention Span:Poor  [place]  Language:Good   Psychomotor Activity  Psychomotor Activity:No data recorded  Assets  Assets:Communication Skills; Resilience; Social Support   Sleep  Sleep:No data recorded   Physical Exam: Physical  Exam Vitals and nursing note reviewed.  Cardiovascular:     Rate and Rhythm: Normal rate.  Neurological:     Mental Status: She is alert and oriented to person, place, and time.  Psychiatric:        Mood and Affect: Mood normal.        Thought Content: Thought content normal.   Review of Systems  Eyes: Negative.   Cardiovascular: Negative.   Psychiatric/Behavioral:  Positive for depression and hallucinations. The patient is nervous/anxious.   All other systems reviewed and are negative. Blood pressure 108/73, pulse 99, temperature 97.6 F (36.4 C), temperature source Oral, resp. rate 20, height '5\' 4"'$  (1.626 m), weight 86.2 kg, SpO2 98 %. Body mass index is 32.61 kg/m.   Treatment Plan Summary: Daily contact with patient to assess and evaluate symptoms and progress in treatment and Medication management  Continue with current treatment plan on 01/12/2019 as listed below except for noted  Acute psychosis: Bipolar 1 disorder severe: Generalized anxiety disorder: Posttraumatic stress disorder  Continue Haldol 5 mg p.o. twice daily Continue Zyprexa 10 p.o. twice daily Continue Depakote sprinkles 500 mg p.o. twice daily Continue Keppra 500 mg p.o. twice daily Continue Ativan 0.5 mg p.o. twice daily  See chart for agitation protocol -Orders placed for updated EKG  CSW to continue working on discharge disposition Patient encouraged to participate within the therapeutic milieu    [place]ill [place], NP 01/11/2022, 11:14 AM

## 2022-01-11 NOTE — [facility] Group Notes (Signed)
Goals Group ?3/4//2023 ? ? ?Group Focus: affirmation, clarity of thought, and goals/reality orientation ?Treatment Modality:  Psychoeducation ?Interventions utilized were assignment, group exercise, and support ?Purpose: To be able to understand and verbalize the reason for their admission to the hospital. To understand that the medication helps with their chemical imbalance but they also need to work on their choices in life. To be challenged to develop Howell list of 30 positives about themselves. Also introduce the concept that "feelings" are not reality. ? ?Participation Level:  did not attend ?Misty Howell ?

## 2022-01-11 NOTE — Progress Notes (Addendum)
D: Patient continues to ruminate about her "son and ex-husband" visiting her this evening. Patient presents with disorganized speech and delusional thoughts. Repeat EKG attempted and patient insisted on having "MD or another person in the room." Patient denies any thoughts of self harm; however, remains delusional and disorganized. ? ?A: Continue to monitor medication management and MD orders.  Safety checks completed every 15 minutes per protocol.  Offer support and encouragement as needed. ? ?R: Patient is cooperative with staff; she is compliant with her medications.  ? 01/11/22 1100  ?Psych Admission Type (Psych Patients Only)  ?Admission Status Involuntary  ?Psychosocial Assessment  ?Patient Complaints Anxiety  ?Eye Contact Fair  ?Facial Expression Anxious  ?Affect Appropriate to circumstance  ?Speech Tangential  ?Interaction Assertive  ?Motor Activity Restless  ?Appearance/Hygiene Unremarkable  ?Behavior Characteristics Appropriate to situation  ?Mood Preoccupied  ?Thought Process  ?Coherency Disorganized  ?Content WDL  ?Delusions Paranoid  ?Perception Hallucinations  ?Hallucination Auditory  ?Judgment Impaired  ?Confusion Mild  ?Danger to Self  ?Current suicidal ideation? Denies  ?Danger to Others  ?Danger to Others None reported or observed  ?Danger to Others Abnormal  ?Harmful Behavior to others No threats or harm toward other people  ? ? ?

## 2022-01-11 NOTE — Progress Notes (Signed)
Adult Psychoeducational Group Note ? ?Date:  01/11/2022 ?Time:  10:16 PM ? ?Group Topic/Focus:  ?Wrap-Up Group:   The focus of this group is to help patients review their daily goal of treatment and discuss progress on daily workbooks. ? ?Participation Level:  Active ? ?Participation Quality:  Appropriate ? ?Affect:  Appropriate ? ?Cognitive:  Appropriate ? ?Insight: Appropriate ? ?Engagement in Group:  Engaged ? ?Modes of Intervention:  Discussion ? ?Additional Comments:   Pt stated her goal for today was to focus on her treatment plan. Pt stated she accomplished her goal today. Pt stated she was able to talked  with her doctor and with her social worker about her care today. Pt rated her overall day a 9 out of 10. Pt stated today was a much better day. Pt stated she was able to contact her sister, son and her mother today which improved her overall day. Pt stated she felt better about herself tonight. Pt stated her son coming for visitation tonight improved her evening. Pt stated staff brought back all her meals today because she is on unit restriction. Pt stated she took all medications provided today. Pt stated her appetite was pretty good today. Pt rated her sleep last night was pretty good. Pt stated the goal tonight was to get some rest. Pt stated she had some physical pain tonight. Pt stated she had some mild pain in her left arm. Pt rated the mild pain in her left arm a 1 on the pain level scale. Pt nurse was updated on the situation. Pt deny visual hallucinations and auditory issues tonight. Pt denies thoughts of harming herself or others. Pt stated she would alert staff if anything changed. ? ?Candy Sledge ?01/11/2022, 10:16 PM ?

## 2022-01-11 NOTE — Group Note (Signed)
?  [facility] LCSW Group Therapy Note ? ?Date/Time:  01/11/2022 11:15AM-12:00PM ? ?Type of Therapy and Topic:  Group Therapy:  Feelings About Hospitalization ? ?Participation Level:  Did Not Attend  ? ?Description of Group ?This process group involved patients discussing their feelings related to being hospitalized, as well as the benefits they see to being in the hospital.  These feelings and benefits were itemized.  The group then brainstormed specific ways in which they could seek those same benefits when they discharge and return home. ? ?Therapeutic Goals ?Patient will identify and describe positive and negative feelings related to hospitalization ?Patient will verbalize benefits of hospitalization to themselves personally ?Patients will brainstorm together ways they can obtain similar benefits in the outpatient setting, identify barriers to wellness and possible solutions ? ?Summary of Patient Progress:  The patient did not attend group. ? ?Therapeutic Modalities ?Cognitive Behavioral Therapy ?Motivational Interviewing ? ? ? ?Selmer Dominion, LCSW ?01/11/2022, 1:06 PM ?   ?

## 2022-01-12 NOTE — Progress Notes (Signed)
Notified by patient's nurse that patient sustained an unwitnessed fall. This Probation officer presented to patient's bedside for a face to face evaluation. On evaluation, patient is sitting on her bed in no apparent distress. She is alert and oriented X4. Patient reports that she"tripped over the blanket and fell backward to a sitting position" . She denies hitting her head or loss of conscience.  She is able to move all extremities without limitation. No injuries/bruising noted; she denies dizziness, LOC, confusion, discomfort/pain ?

## 2022-01-12 NOTE — Progress Notes (Addendum)
D: Patient continues to have disorganized thought processes. She continues request that nursing staff put her son's name down for visitation. She also bring up her ex-husband being nearby the hospital. Patient states, "I feel safe here; however, I know my ex is around." Patient denies any thoughts of self harm. She continues to have delusional thoughts. Patient does not have any physical complaints today. ? ?A: Continue to monitor medication management and MD orders.  Safety checks completed every 15 minutes per protocol.  Offer support and encouragement as needed. ? ?R: Patient is receptive to staff; her behavior is appropriate.   ? ? 01/12/22 0800  ?Psych Admission Type (Psych Patients Only)  ?Admission Status Involuntary  ?Psychosocial Assessment  ?Patient Complaints Anxiety  ?Eye Contact Fair  ?Facial Expression Animated  ?Affect Preoccupied  ?Speech Tangential  ?Interaction Assertive  ?Motor Activity Restless  ?Appearance/Hygiene Unremarkable  ?Behavior Characteristics Anxious  ?Mood Preoccupied  ?Thought Process  ?Coherency Disorganized  ?Content WDL  ?Delusions Paranoid  ?Perception Hallucinations  ?Hallucination Auditory  ?Judgment Impaired  ?Confusion Mild  ?Danger to Self  ?Current suicidal ideation? Denies  ?Danger to Others  ?Danger to Others None reported or observed  ?Danger to Others Abnormal  ?Destructive Behavior No threats or harm toward property  ? ? ?

## 2022-01-12 NOTE — [facility] Group Notes (Signed)
Adult Psychoeducational Group Note ? ?Date:  01/12/2022 ?Time:  11:41 AM ? ?Group Topic/Focus:  ?Goals Group:   The focus of this group is to help patients establish daily goals to achieve during treatment and discuss how the patient can incorporate goal setting into their daily lives to aide in recovery. ? ?Participation Level:  Active ? ?Participation Quality:  Inattentive ? ?Affect:  Appropriate ? ?Cognitive:  Appropriate ? ?Insight: Appropriate ? ?Engagement in Group:  Limited ? ?Modes of Intervention:  Discussion ? ?Additional Comments:   Patient attended goals group. Patient's goal was to get a discharge plan. ? ?Nicoletta Hush T Ria Comment ?01/12/2022, 11:41 AM ?

## 2022-01-12 NOTE — Progress Notes (Signed)
?   01/12/22 0022  ?What Happened  ?Was fall witnessed? No  ?Was patient injured? No  ?Patient found other (Comment) ?(pt came out of her room and reported she fell)  ?Stated prior activity other (comment)  ?Follow Up  ?MD notified Leandro Reasoner ?(pt reported she tripped on her blanket on her way to bathroom)  ?Time MD notified [id number]  ?Family notified No - patient refusal  ?Additional tests No  ? ? ?

## 2022-01-12 NOTE — Progress Notes (Signed)
?   01/12/22 0022  ?What Happened  ?Was fall witnessed? No  ? ?Patient came out of her room reported to the staff that she fell in her room . Patient said she tripped on her blanket on her way to the bathroom. States she fell backward and sat on floor. Denies hitting her head and denies any injuries. ?

## 2022-01-12 NOTE — [facility] Group Notes (Signed)
Adult Psychoeducational Group Not ?Date:  01/12/2022 ?Time:  1834-3735 ?Group Topic/Focus: PROGRESSIVE RELAXATION. A group where deep breathing is taught and tensing and relaxation muscle groups is used. Imagery is used as well.  Pts are asked to imagine 3 pillars that hold them up when they are not able to hold themselves up and to share that with the group. ? ?Participation Level: did not attend ? ?Bryson Dames A ? ? ?

## 2022-01-12 NOTE — Progress Notes (Signed)
Patient came out of her room reported to the staff that she fell in her room . Patient said she tripped on her blanket on her way to the bathroom. States she fell backward and sat on floor. Denies hitting her head and denies any injuries. ?

## 2022-01-12 NOTE — Progress Notes (Addendum)
[facility] MD Progress Note  01/12/2022 12:48 PM DER GAGLIANO  MRN:  [id number]  Subjective: " I worry about my son, he is with his father and I cannot see him" Reason for Admission: Patient is a 56 year old female with a psychiatric history of "anxiety I think that is all" who was admitted to this psychiatric unit from [facility] for evaluation psychotic symptoms and manic and manic symptoms.  Patient was medically cleared by outside emergency department.  Today's note:  Chart reviewed and care reviewed with members of our interdisciplinary team.  Patient was seen in her room which is littered with clothing everywhere and linens and blanket scatter between her bed and the floor.  Her speech remains rapid and disorganized.  She appeared sleeping in between sentences.   She reported poor sleep last night because she woke up three times without any reason.  Record shows she tripped over her blanket and fell.  No injuries noted. She reported that she is worried about her 84 years old son who is staying with his father.  She reported she has two sons and per Nursing report she is still talking about one of her decreased son as if he is alive.  She jumped from one topic to another stating she hears voices telling her not to listen people who tells her to do the wrong things.  Her Depakote was increased yesterday and level is scheduled for tomorrow.  She is taking Haldol and Olanzapine QTC 404 as of yesterday.  Not sure if Haldol is being used to taper off Olanzapine for LAI.  Patient reported good appetite but plans to get some needed rest after our interaction.  She denied  SI/HI and no mention of Paranoia but reported AH.  Principal Problem: Bipolar I disorder, current or most recent episode manic, severe ([place]) Diagnosis: Principal Problem:   Bipolar I disorder, current or most recent episode manic, severe ([place]) Active Problems:   Acute psychosis ([place])   GAD (generalized anxiety disorder)   PTSD (post-traumatic  stress disorder)  Total Time spent with patient: 15 minutes  Past Psychiatric History:   Past Medical History:  Past Medical History:  Diagnosis Date   Anxiety    Cancer ([place])    Giant cell tumor left fifth finger.   Hypercholesterolemia    Overweight(278.02)    Seizures ([facility])    Syncope and collapse    Vision abnormalities    Vitamin D deficiency     Past Surgical History:  Procedure Laterality Date   CESAREAN SECTION     FINGER SURGERY     due to giant cell tumor, hip bone placed in finger   Family History:  Family History  Problem Relation Age of Onset   Hypertension Mother    Migraines Mother    Hypertension Father    Family Psychiatric  History:  Social History:  Social History   Substance and Sexual Activity  Alcohol Use Not Currently   Comment: social     Social History   Substance and Sexual Activity  Drug Use Yes   Types: Marijuana   Comment: Pt stated she used Delta 8 a couple weeks ago    Social History   Socioeconomic History   Marital status: Legally Separated    Spouse name: Not on file   Number of children: Not on file   Years of education: Not on file   Highest education level: Not on file  Occupational History   Occupation: Finance  Tobacco Use  Smoking status: Former    Packs/day: 0.00    Years: 3.00    Pack years: 0.00    Types: Cigarettes    Quit date: 01/20/1989    Years since quitting: 33.0   Smokeless tobacco: Never  Vaping Use   Vaping Use: Never used  Substance and Sexual Activity   Alcohol use: Not Currently    Comment: social   Drug use: Yes    Types: Marijuana    Comment: Pt stated she used Delta 8 a couple weeks ago   Sexual activity: Not Currently  Other Topics Concern   Not on file  Social History Narrative   Pt lives in [place] with mother and two teenaged sons.  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  Not followed by an outpatient psych provider.   Social Determinants of Health   Financial Resource Strain: Not  on file  Food Insecurity: Not on file  Transportation Needs: Not on file  Physical Activity: Not on file  Stress: Not on file  Social Connections: Not on file   Additional Social History:                         Sleep: Fair  Appetite:  Fair  Current Medications: Current Facility-Administered Medications  Medication Dose Route Frequency Provider Last Rate Last Admin   acetaminophen (TYLENOL) tablet 650 mg  650 mg Oral Q6H PRN Ethelene Hal, NP       alum & mag hydroxide-simeth (MAALOX/MYLANTA) 200-200-20 MG/5ML suspension 30 mL  30 mL Oral Q4H PRN Ethelene Hal, NP       divalproex (DEPAKOTE SPRINKLE) capsule 500 mg  500 mg Oral Q12H Massengill, Ovid Curd, MD   500 mg at 01/12/22 0756   haloperidol (HALDOL) tablet 5 mg  5 mg Oral BID Janine Limbo, MD   5 mg at 01/12/22 0756   hydrOXYzine (ATARAX) tablet 25 mg  25 mg Oral TID PRN Ethelene Hal, NP   25 mg at 01/10/22 0848   levETIRAcetam (KEPPRA) tablet 500 mg  500 mg Oral BID Janine Limbo, MD   500 mg at 01/12/22 0756   LORazepam (ATIVAN) tablet 0.5 mg  0.5 mg Oral BID Massengill, Ovid Curd, MD   0.5 mg at 01/12/22 0755   OLANZapine zydis (ZYPREXA) disintegrating tablet 5 mg  5 mg Oral Q8H PRN Massengill, Ovid Curd, MD       And   LORazepam (ATIVAN) tablet 1 mg  1 mg Oral PRN Massengill, Ovid Curd, MD       And   ziprasidone (GEODON) injection 20 mg  20 mg Intramuscular PRN Massengill, Ovid Curd, MD       magnesium hydroxide (MILK OF MAGNESIA) suspension 30 mL  30 mL Oral Daily PRN Ethelene Hal, NP       OLANZapine zydis (ZYPREXA) disintegrating tablet 10 mg  10 mg Oral BID Massengill, Ovid Curd, MD   10 mg at 01/12/22 0756   propranolol ER (INDERAL LA) 24 hr capsule 60 mg  60 mg Oral Daily Massengill, Ovid Curd, MD   60 mg at 01/12/22 0754   traZODone (DESYREL) tablet 50 mg  50 mg Oral QHS PRN Ethelene Hal, NP   50 mg at 01/11/22 2114   Vitamin D (Ergocalciferol) (DRISDOL) capsule 50,000  Units  50,000 Units Oral Q7 days [name], MD   50,000 Units at 01/08/22 1711    Lab Results: No results found for this or any previous visit (from the past 48 hour(s)).  Blood Alcohol level:  Lab Results  Component Value Date   ETH <10 01/03/2022   ETH <10 96/75/9163    Metabolic Disorder Labs: Lab Results  Component Value Date   HGBA1C 5.6 01/07/2022   MPG 114.02 01/07/2022   MPG 108.28 08/08/2017   No results found for: PROLACTIN Lab Results  Component Value Date   CHOL 154 01/07/2022   TRIG 153 (H) 01/07/2022   HDL 49 01/07/2022   CHOLHDL 3.1 01/07/2022   VLDL 31 01/07/2022   LDLCALC 74 01/07/2022   LDLCALC 121 (H) 04/13/2009    Physical Findings: AIMS: Facial and Oral Movements Muscles of Facial Expression: None, normal Lips and Perioral Area: None, normal Jaw: None, normal Tongue: None, normal,Extremity Movements Upper (arms, wrists, hands, fingers): None, normal Lower (legs, knees, ankles, toes): None, normal, Trunk Movements Neck, shoulders, hips: None, normal, Overall Severity Severity of abnormal movements (highest score from questions above): None, normal Incapacitation due to abnormal movements: None, normal Patient's awareness of abnormal movements (rate only patient's report): No Awareness, Dental Status Current problems with teeth and/or dentures?: No Does patient usually wear dentures?: No  CIWA:    COWS:     Musculoskeletal: Strength & Muscle Tone: within normal limits Gait & Station: normal Patient leans: N/A  Psychiatric Specialty Exam:  Presentation  General Appearance: Disheveled  Eye Contact:Good  Speech:Pressured  Speech Volume:Normal  Handedness:Right   Mood and Affect  Mood:Anxious  Affect:Full Range   Thought Process  Thought Processes:Disorganized  Descriptions of Associations:Tangential  Orientation:Full (Time, Place and Person)  Thought Content:Tangential; Paranoid Ideation  History of  Schizophrenia/Schizoaffective disorder:No  Duration of Psychotic Symptoms:Less than six months  Hallucinations:No data recorded Ideas of Reference:Paranoia  Suicidal Thoughts:No data recorded Homicidal Thoughts:No data recorded  Sensorium  Memory:Immediate Fair; Recent Fair; Remote Fair  Judgment:Poor  Insight:Poor   Executive Functions  Concentration:Poor  Attention Span:Poor  [place]  Language:Good   Psychomotor Activity  Psychomotor Activity:No data recorded  Assets  Assets:Communication Skills; Resilience; Social Support   Sleep  Sleep:No data recorded   Physical Exam: Physical Exam Vitals and nursing note reviewed.  Cardiovascular:     Rate and Rhythm: Normal rate.  Neurological:     Mental Status: She is alert and oriented to person, place, and time.  Psychiatric:        Mood and Affect: Mood normal.        Thought Content: Thought content normal.   Review of Systems  Eyes: Negative.   Cardiovascular: Negative.   Psychiatric/Behavioral:  Positive for depression and hallucinations. The patient is nervous/anxious.   All other systems reviewed and are negative. Blood pressure 111/77, pulse 97, temperature (!) 97.4 F (36.3 C), temperature source Oral, resp. rate 20, height '5\' 4"'$  (1.626 m), weight 86.2 kg, SpO2 100 %. Body mass index is 32.61 kg/m.   Treatment Plan Summary:  Depakote level in am 01/13/2022 Daily contact with patient to assess and evaluate symptoms and progress in treatment and Medication management Acute psychosis: Bipolar 1 disorder severe: Generalized anxiety disorder: Posttraumatic stress disorder  Continue Haldol 5 mg p.o. twice daily Continue Zyprexa 10 p.o. twice daily Continue Depakote sprinkles 500 mg p.o. twice daily Continue Keppra 500 mg p.o. twice daily Continue Ativan 0.5 mg p.o. twice daily  See chart for agitation protocol -updated EKG-QTC 01/11/22-404  CSW to continue working on  discharge disposition Patient encouraged to participate within the therapeutic milieu    Delfin Gant, NP- PMHNP-BC 01/12/2022, 12:48 PM

## 2022-01-12 NOTE — Progress Notes (Signed)
Adult Psychoeducational Group Note ? ?Date:  01/12/2022 ?Time:  8:16 PM ? ?Group Topic/Focus:  ?Wrap-Up Group:   The focus of this group is to help patients review their daily goal of treatment and discuss progress on daily workbooks. ? ?Participation Level:  Did Not Attend ? ?Participation Quality:   Did Not Attend ? ?Affect:   Did Not Attend ? ?Cognitive:   Did Not Attend ? ?Insight: None ? ?Engagement in Group:   Did Not Attend ? ?Modes of Intervention:   Did Not Attend ? ?Additional Comments: Pt was encouraged to attend wrap up group but did not attend.  ? ?Candy Sledge ?01/12/2022, 8:16 PM ?

## 2022-01-12 NOTE — Group Note (Signed)
[place] LCSW Group Therapy Note ? ?Date/Time:  01/12/2022  11:00AM-12:00PM ? ?Type of Therapy and Topic:  Group Therapy:  Music and Mood ? ?Participation Level:  Minimal  ? ?Description of Group: ?In this process group, members listened to a variety of genres of music and identified that different types of music evoke different responses.  Patients were encouraged to identify music that was soothing for them and music that was energizing for them.  Patients discussed how this knowledge can help with wellness and recovery in various ways including managing depression and anxiety as well as encouraging healthy sleep habits.   ? ?Therapeutic Goals: ?Patients will explore the impact of different varieties of music on mood ?Patients will verbalize the thoughts they have when listening to different types of music ?Patients will identify music that is soothing to them as well as music that is energizing to them ?Patients will discuss how to use this knowledge to assist in maintaining wellness and recovery ?Patients will explore the use of music as a coping skill ? ?Summary of Patient Progress:  At the beginning of group, patient expressed her mood was a little worried.  She came and went to group several times, looking anxious.  At the end of group, patient expressed her mood was a little better.   ? ?Therapeutic Modalities: ?Solution Focused Brief Therapy ?Activity ? ? ?Selmer Dominion, LCSW ?  ?

## 2022-01-13 ENCOUNTER — Encounter (HOSPITAL_COMMUNITY): Payer: Self-pay

## 2022-01-13 LAB — VALPROIC ACID LEVEL: Valproic Acid Lvl: 77 ug/mL (ref 50.0–100.0)

## 2022-01-13 MED ORDER — LITHIUM CARBONATE ER 300 MG PO TBCR
300.0000 mg | EXTENDED_RELEASE_TABLET | Freq: Two times a day (BID) | ORAL | Status: DC
  Administered 2022-01-13 – 2022-01-15 (×5): 300 mg via ORAL
  Filled 2022-01-13 (×8): qty 1

## 2022-01-13 NOTE — Plan of Care (Signed)
?  Problem: Coping: ?Goal: Ability to verbalize frustrations and anger appropriately will improve ?Outcome: Progressing ?  ?Problem: Coping: ?Goal: Ability to demonstrate self-control will improve ?Outcome: Progressing ?  ?Problem: Safety: ?Goal: Periods of time without injury will increase ?Outcome: Progressing ?  ?Problem: Health Behavior/Discharge Planning: ?Goal: Compliance with prescribed medication regimen will improve ?Outcome: Progressing ?  ?Problem: Safety: ?Goal: Ability to redirect hostility and anger into socially appropriate behaviors will improve ?Outcome: Progressing ?  ?Problem: Safety: ?Goal: Ability to remain free from injury will improve ?Outcome: Progressing ?  ?

## 2022-01-13 NOTE — Group Note (Signed)
[place] LCSW Group Therapy Note ? ?Date/Time: ? ?Type of Therapy and Topic:  Group Therapy:  Communication ? ?Participation Level:  Did not attend ? ?Description of Group:   ? In this group patients will be encouraged to explore how individuals communicate with one another appropriately and inappropriately. Patients will be guided to discuss their thoughts, feelings, and behaviors related to barriers communicating feelings, needs, and stressors. The group will process together ways to execute positive and appropriate communications, with attention given to how one use behavior, tone, and body language to communicate. Patient will be encouraged to reflect on an incident where they were successfully able to communicate and the factors that they believe helped them to communicate. Each patient will be encouraged to identify specific changes they are motivated to make in order to overcome communication barriers with self, peers, authority, and parents. This group will be process-oriented, with patients participating in exploration of their own experiences as well as giving and receiving support and challenging self as well as other group members. ? ?Therapeutic Goals: ?Patient will identify how people communicate (body language, facial expression, and electronics) Also discuss tone, voice and how these impact what is communicated and how the message is perceived.  ?Patient will identify feelings (such as fear or worry), thought process and behaviors related to why people internalize feelings rather than express self openly. ?Patient will identify two changes they are willing to make to overcome communication barriers. ?Members will then practice through Role Play how to communicate by utilizing psycho-education material (such as I Feel statements and acknowledging feelings rather than displacing on others) ? ? ?Summary of Patient Progress: Did not attend.  ? ? ? ? ?Therapeutic Modalities:   ?Cognitive Behavioral  Therapy ?Solution Focused Therapy ?Motivational Interviewing ?Family Systems Approach ? ?

## 2022-01-13 NOTE — [facility] Group Notes (Signed)
[place] Group Notes:  (Nursing/MHT/Case Management/Adjunct) ? ?Date:  01/13/2022  ?Time:  10:01 AM ? ?Type of Therapy:   Orientation/Goals group ? ?Participation Level:  Active ? ?Participation Quality:  Appropriate ? ?Affect:  Appropriate ? ?Cognitive:  Appropriate ? ?Insight:  Appropriate ? ?Engagement in Group:  Engaged and Improving ? ?Modes of Intervention:  Discussion, Education, and Orientation ? ?Summary of Progress/Problems: Pt goal for today is to be a better mom, daughter and friend and to do the right thing.  ? ?Misty Howell ?01/13/2022, 10:01 AM ?

## 2022-01-13 NOTE — BH IP Treatment Plan (Signed)
Interdisciplinary Treatment and Diagnostic Plan Update  01/13/2022 Time of Session: 9:30am Misty Howell MRN: [id number]  Principal Diagnosis: Bipolar I disorder, current or most recent episode manic, severe ([place])  Secondary Diagnoses: Principal Problem:   Bipolar I disorder, current or most recent episode manic, severe ([facility]) Active Problems:   Acute psychosis ([place])   GAD (generalized anxiety disorder)   PTSD (post-traumatic stress disorder)   Current Medications:  Current Facility-Administered Medications  Medication Dose Route Frequency Provider Last Rate Last Admin   acetaminophen (TYLENOL) tablet 650 mg  650 mg Oral Q6H PRN Ethelene Hal, NP       alum & mag hydroxide-simeth (MAALOX/MYLANTA) 200-200-20 MG/5ML suspension 30 mL  30 mL Oral Q4H PRN Ethelene Hal, NP       divalproex (DEPAKOTE SPRINKLE) capsule 500 mg  500 mg Oral Q12H Massengill, Ovid Curd, MD   500 mg at 01/13/22 0804   haloperidol (HALDOL) tablet 5 mg  5 mg Oral BID Massengill, Ovid Curd, MD   5 mg at 01/13/22 0805   hydrOXYzine (ATARAX) tablet 25 mg  25 mg Oral TID PRN Ethelene Hal, NP   25 mg at 01/10/22 0848   levETIRAcetam (KEPPRA) tablet 500 mg  500 mg Oral BID Massengill, Ovid Curd, MD   500 mg at 01/13/22 0805   LORazepam (ATIVAN) tablet 0.5 mg  0.5 mg Oral BID Massengill, Ovid Curd, MD   0.5 mg at 01/13/22 0806   OLANZapine zydis (ZYPREXA) disintegrating tablet 5 mg  5 mg Oral Q8H PRN Massengill, Ovid Curd, MD       And   LORazepam (ATIVAN) tablet 1 mg  1 mg Oral PRN Massengill, Ovid Curd, MD       And   ziprasidone (GEODON) injection 20 mg  20 mg Intramuscular PRN Massengill, Ovid Curd, MD       magnesium hydroxide (MILK OF MAGNESIA) suspension 30 mL  30 mL Oral Daily PRN Ethelene Hal, NP       OLANZapine zydis (ZYPREXA) disintegrating tablet 10 mg  10 mg Oral BID Massengill, Ovid Curd, MD   10 mg at 01/13/22 0805   propranolol ER (INDERAL LA) 24 hr capsule 60 mg  60 mg Oral Daily Massengill,  Ovid Curd, MD   60 mg at 01/13/22 0805   traZODone (DESYREL) tablet 50 mg  50 mg Oral QHS PRN Ethelene Hal, NP   50 mg at 01/11/22 2114   Vitamin D (Ergocalciferol) (DRISDOL) capsule 50,000 Units  50,000 Units Oral Q7 days [name], MD   50,000 Units at 01/08/22 1711   PTA Medications: Medications Prior to Admission  Medication Sig Dispense Refill Last Dose   Eszopiclone 3 MG TABS Take 1 tablet (3 mg total) by mouth at bedtime as needed. Take immediately before bedtime (Patient not taking: Reported on 01/04/2022) 10 tablet 0    HYDROcodone-acetaminophen (NORCO/VICODIN) 5-325 MG tablet Take 1 tablet by mouth every 6 (six) hours as needed for severe pain. (Patient not taking: Reported on 01/04/2022) 10 tablet 0    hydrOXYzine (ATARAX) 25 MG tablet Take 1 tablet (25 mg total) by mouth every 6 (six) hours as needed for anxiety. (Patient not taking: Reported on 01/04/2022) 30 tablet 0    ibuprofen (ADVIL) 600 MG tablet Take 1 tablet (600 mg total) by mouth every 6 (six) hours as needed. (Patient not taking: Reported on 01/04/2022) 30 tablet 0    levETIRAcetam (KEPPRA) 500 MG tablet Take 1 tablet (500 mg total) by mouth 2 (two) times daily. (Patient not taking:  Reported on 01/04/2022) 60 tablet 1    lidocaine (LIDODERM) 5 % Place 1 patch onto the skin daily. Remove & Discard patch within 12 hours or as directed by MD (Patient not taking: Reported on 01/04/2022) 30 patch 0    methocarbamol (ROBAXIN) 750 MG tablet Take 1 tablet (750 mg total) by mouth 2 (two) times daily as needed for muscle spasms (or muscle tightness). (Patient not taking: Reported on 01/04/2022) 20 tablet 0    nitrofurantoin, macrocrystal-monohydrate, (MACROBID) 100 MG capsule Take 1 capsule (100 mg total) by mouth 2 (two) times daily. (Patient not taking: Reported on 01/04/2022) 10 capsule 0    ondansetron (ZOFRAN ODT) 4 MG disintegrating tablet Take 1 tablet (4 mg total) by mouth every 8 (eight) hours as needed for nausea or vomiting.  (Patient not taking: Reported on 01/04/2022) 20 tablet 0     Patient Stressors: Other: none stated by pt    Patient Strengths: Supportive family/friends   Treatment Modalities: Medication Management, Group therapy, Case management,  1 to 1 session with clinician, Psychoeducation, Recreational therapy.   Physician Treatment Plan for Primary Diagnosis: Bipolar I disorder, current or most recent episode manic, severe ([place]) Long Term Goal(s): Improvement in symptoms so as ready for discharge   Short Term Goals: Ability to identify changes in lifestyle to reduce recurrence of condition will improve Ability to verbalize feelings will improve Ability to demonstrate self-control will improve Ability to identify and develop effective coping behaviors will improve Ability to maintain clinical measurements within normal limits will improve Compliance with prescribed medications will improve Ability to identify triggers associated with substance abuse/mental health issues will improve  Medication Management: Evaluate patient's response, side effects, and tolerance of medication regimen.  Therapeutic Interventions: 1 to 1 sessions, Unit Group sessions and Medication administration.  Evaluation of Outcomes: Not Met  Physician Treatment Plan for Secondary Diagnosis: Principal Problem:   Bipolar I disorder, current or most recent episode manic, severe ([place]) Active Problems:   Acute psychosis ([place])   GAD (generalized anxiety disorder)   PTSD (post-traumatic stress disorder)  Long Term Goal(s): Improvement in symptoms so as ready for discharge   Short Term Goals: Ability to identify changes in lifestyle to reduce recurrence of condition will improve Ability to verbalize feelings will improve Ability to demonstrate self-control will improve Ability to identify and develop effective coping behaviors will improve Ability to maintain clinical measurements within normal limits will  improve Compliance with prescribed medications will improve Ability to identify triggers associated with substance abuse/mental health issues will improve     Medication Management: Evaluate patient's response, side effects, and tolerance of medication regimen.  Therapeutic Interventions: 1 to 1 sessions, Unit Group sessions and Medication administration.  Evaluation of Outcomes: Not Met   RN Treatment Plan for Primary Diagnosis: Bipolar I disorder, current or most recent episode manic, severe ([place]) Long Term Goal(s): Knowledge of disease and therapeutic regimen to maintain health will improve  Short Term Goals: Ability to remain free from injury will improve, Ability to verbalize frustration and anger appropriately will improve, Ability to demonstrate self-control, Ability to participate in decision making will improve, Ability to identify and develop effective coping behaviors will improve, and Compliance with prescribed medications will improve  Medication Management: RN will administer medications as ordered by provider, will assess and evaluate patient's response and provide education to patient for prescribed medication. RN will report any adverse and/or side effects to prescribing provider.  Therapeutic Interventions: 1 on 1 counseling sessions, Psychoeducation, Medication  administration, Evaluate responses to treatment, Monitor vital signs and CBGs as ordered, Perform/monitor CIWA, COWS, AIMS and Fall Risk screenings as ordered, Perform wound care treatments as ordered.  Evaluation of Outcomes: Not Met   LCSW Treatment Plan for Primary Diagnosis: Bipolar I disorder, current or most recent episode manic, severe ([place]) Long Term Goal(s): Safe transition to appropriate next level of care at discharge, Engage patient in therapeutic group addressing interpersonal concerns.  Short Term Goals: Engage patient in aftercare planning with referrals and resources, Increase social support,  Increase ability to appropriately verbalize feelings, Increase emotional regulation, Identify triggers associated with mental health/substance abuse issues, and Increase skills for wellness and recovery  Therapeutic Interventions: Assess for all discharge needs, 1 to 1 time with Social worker, Explore available resources and support systems, Assess for adequacy in community support network, Educate family and significant other(s) on suicide prevention, Complete Psychosocial Assessment, Interpersonal group therapy.  Evaluation of Outcomes: Not Met   Progress in Treatment: Attending groups: Yes. Participating in groups: Yes. Taking medication as prescribed: Yes. Toleration medication: Yes. Family/Significant other contact made: Yes, individual(s) contacted:  Mother  Patient understands diagnosis: No. Discussing patient identified problems/goals with staff: Yes. Medical problems stabilized or resolved: Yes. Denies suicidal/homicidal ideation: Yes. Issues/concerns per patient self-inventory: No.     New problem(s) identified: No, Describe:  None    New Short Term/Long Term Goal(s): medication stabilization, elimination of SI thoughts, development of comprehensive mental wellness plan.    Patient Goals: "To be a better person"    Discharge Plan or Barriers: Patient is to follow up with [facility] for therapy and medication management   Reason for Continuation of Hospitalization: Anxiety Hallucinations Mania Medication stabilization   Estimated Length of Stay: 3 to 5 days    Scribe for Treatment Team: Vassie Moselle, LCSW 01/13/2022 11:41 AM

## 2022-01-13 NOTE — Progress Notes (Signed)
[facility] MD Progress Note  01/13/2022 12:18 PM Misty Howell  MRN:  [id number] Subjective:  Patient is a 56 year old female with a psychiatric history of "anxiety I think that is all" who was admitted to this psychiatric unit from [facility] for evaluation psychotic symptoms and manic symptoms.  Yesterday, the psychiatry team made the following recommendations: Continue Haldol 5 mg p.o. twice daily Continue Zyprexa 10 p.o. twice daily Continue Depakote sprinkles 500 mg p.o. twice daily Continue Keppra 500 mg p.o. twice daily Continue Ativan 0.5 mg p.o. twice daily  Per nursing: pt is illogical, disorganized, and manic.  On my assessment today, the patient continues to have disorganized and illogical thoughts.  Her speech is less pressured.  She has less racing thoughts.  However she remains confused.  She reports that her children are in trouble and this causes her mood to be "worried and anxious".  She reports feeling depressed and distraught due to her delusional thought construct. She is starting to challenge some of the paranoid thoughts that she was having previously about her son Misty Howell (who passed away about 1 years ago) she reports that she is no longer convinced that he is alive and that it is possible that her mind is playing tricks on her and that he is in fact deceased. She reports sleep is about 4 to 5 hours per night. She reports continuing to have auditory hallucinations, that tell her "it is going to be okay". She denies having suicidal thoughts.  Denies having homicidal thoughts.  She denies having side effects to current prescribed psychiatric medications.  Patient is agreeable to starting lithium for treatment of mood instability.  She has no EPS on my physical exam today.    Collateral from mother 01-08-2022: Misty Howell at [phone] states that last week Pt had not been sleeping well and took her belongings  in her room, she was fidgety, and hide and covered everything.  She parked her car in the backyard.  She called 911 multiple times.  When police came they told her that she looked anxious and they observed her for some time. She then took her to the hospital where she was not acting right, she refused treatment and was agitated.  She then started walking outside.  Mom states when she came home, she locked her door and she was up all night talking to herself and making loud sound.  Mom states that patient lost her 52 year old son last year in February 2022 due to drug overdose.  Patient had similar episode 2 years ago and was admitted to facility in [facility] for 7 to 10 days.  Mom states patient is always hyper and quick to speak her mind.  Mom states she has a history of seizures and had 4-5 seizures.  Mom states she does not sleep more than 3 hours at night.  Updated mom about current medications.  Mom states that she is not able to take care of her at this time.  Principal Problem: Bipolar I disorder, current or most recent episode manic, severe ([place]) Diagnosis: Principal Problem:   Bipolar I disorder, current or most recent episode manic, severe ([facility]) Active Problems:   Acute psychosis ([place])   GAD (generalized anxiety disorder)   PTSD (post-traumatic stress disorder)  Total Time spent with patient: 20 minutes  Past Psychiatric History: see H&P  Past Medical History:  Past Medical History:  Diagnosis Date   Anxiety    Cancer ([place])    Giant cell tumor left fifth  finger.   Hypercholesterolemia    Overweight(278.02)    Seizures (HCC)    Syncope and collapse    Vision abnormalities    Vitamin D deficiency     Past Surgical History:  Procedure Laterality Date   CESAREAN SECTION     FINGER SURGERY     due to giant cell tumor, hip bone placed in finger   Family History:  Family History  Problem Relation Age of Onset   Hypertension Mother    Migraines Mother    Hypertension Father    Family Psychiatric  History: see H&P Social History:   Social History   Substance and Sexual Activity  Alcohol Use Not Currently   Comment: social     Social History   Substance and Sexual Activity  Drug Use Yes   Types: Marijuana   Comment: Pt stated she used Delta 8 a couple weeks ago    Social History   Socioeconomic History   Marital status: Legally Separated    Spouse name: Not on file   Number of children: Not on file   Years of education: Not on file   Highest education level: Not on file  Occupational History   Occupation: Finance  Tobacco Use   Smoking status: Former    Packs/day: 0.00    Years: 3.00    Pack years: 0.00    Types: Cigarettes    Quit date: 01/20/1989    Years since quitting: 33.0   Smokeless tobacco: Never  Vaping Use   Vaping Use: Never used  Substance and Sexual Activity   Alcohol use: Not Currently    Comment: social   Drug use: Yes    Types: Marijuana    Comment: Pt stated she used Delta 8 a couple weeks ago   Sexual activity: Not Currently  Other Topics Concern   Not on file  Social History Narrative   Pt lives in [place] with mother and two teenaged sons.  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  Not followed by an outpatient psych provider.   Social Determinants of Health   Financial Resource Strain: Not on file  Food Insecurity: Not on file  Transportation Needs: Not on file  Physical Activity: Not on file  Stress: Not on file  Social Connections: Not on file   Additional Social History:                         Sleep: 4-5 hours   Appetite:  Good  Current Medications: Current Facility-Administered Medications  Medication Dose Route Frequency Provider Last Rate Last Admin   acetaminophen (TYLENOL) tablet 650 mg  650 mg Oral Q6H PRN Ethelene Hal, NP       alum & mag hydroxide-simeth (MAALOX/MYLANTA) 200-200-20 MG/5ML suspension 30 mL  30 mL Oral Q4H PRN Ethelene Hal, NP       divalproex (DEPAKOTE SPRINKLE) capsule 500 mg  500 mg Oral Q12H Perl Folmar,  Lalaine Overstreet, MD   500 mg at 01/13/22 0804   haloperidol (HALDOL) tablet 5 mg  5 mg Oral BID Sativa Gelles, Ovid Curd, MD   5 mg at 01/13/22 0805   hydrOXYzine (ATARAX) tablet 25 mg  25 mg Oral TID PRN Ethelene Hal, NP   25 mg at 01/10/22 0848   levETIRAcetam (KEPPRA) tablet 500 mg  500 mg Oral BID Kiwan Gadsden, Ovid Curd, MD   500 mg at 01/13/22 0805   lithium carbonate (LITHOBID) CR tablet 300 mg  300 mg Oral Q12H Merl Bommarito,  Ovid Curd, MD       LORazepam (ATIVAN) tablet 0.5 mg  0.5 mg Oral BID Janine Limbo, MD   0.5 mg at 01/13/22 0806   OLANZapine zydis (ZYPREXA) disintegrating tablet 5 mg  5 mg Oral Q8H PRN Coreen Shippee, Ovid Curd, MD       And   LORazepam (ATIVAN) tablet 1 mg  1 mg Oral PRN Kailynne Ferrington, Ovid Curd, MD       And   ziprasidone (GEODON) injection 20 mg  20 mg Intramuscular PRN Dayanira Giovannetti, Ovid Curd, MD       magnesium hydroxide (MILK OF MAGNESIA) suspension 30 mL  30 mL Oral Daily PRN Ethelene Hal, NP       OLANZapine zydis (ZYPREXA) disintegrating tablet 10 mg  10 mg Oral BID Norissa Bartee, Ovid Curd, MD   10 mg at 01/13/22 0805   propranolol ER (INDERAL LA) 24 hr capsule 60 mg  60 mg Oral Daily Apolinar Bero, Ovid Curd, MD   60 mg at 01/13/22 0805   traZODone (DESYREL) tablet 50 mg  50 mg Oral QHS PRN Ethelene Hal, NP   50 mg at 01/11/22 2114   Vitamin D (Ergocalciferol) (DRISDOL) capsule 50,000 Units  50,000 Units Oral Q7 days [name], MD   50,000 Units at 01/08/22 1711    Lab Results:  Results for orders placed or performed during the hospital encounter of 01/06/22 (from the past 48 hour(s))  Valproic acid level     Status: None   Collection Time: 01/13/22  6:24 AM  Result Value Ref Range   Valproic Acid Lvl 77 50.0 - 100.0 ug/mL    Comment: Performed at [facility], [place] [address], [place], [place] [address]     Blood Alcohol level:  Lab Results  Component Value Date   ETH <10 01/03/2022   ETH <10 36/64/4034    Metabolic Disorder Labs: Lab  Results  Component Value Date   HGBA1C 5.6 01/07/2022   MPG 114.02 01/07/2022   MPG 108.28 08/08/2017   No results found for: PROLACTIN Lab Results  Component Value Date   CHOL 154 01/07/2022   TRIG 153 (H) 01/07/2022   HDL 49 01/07/2022   CHOLHDL 3.1 01/07/2022   VLDL 31 01/07/2022   LDLCALC 74 01/07/2022   LDLCALC 121 (H) 04/13/2009    Physical Findings: AIMS: Facial and Oral Movements Muscles of Facial Expression: None, normal Lips and Perioral Area: None, normal Jaw: None, normal Tongue: None, normal,Extremity Movements Upper (arms, wrists, hands, fingers): None, normal Lower (legs, knees, ankles, toes): None, normal, Trunk Movements Neck, shoulders, hips: None, normal, Overall Severity Severity of abnormal movements (highest score from questions above): None, normal Incapacitation due to abnormal movements: None, normal Patient's awareness of abnormal movements (rate only patient's report): No Awareness, Dental Status Current problems with teeth and/or dentures?: No Does patient usually wear dentures?: No aims score zero on 01-10-2022  CIWA:    COWS:     Musculoskeletal: Strength & Muscle Tone: within normal limits Gait & Station: normal Patient leans: N/A  Psychiatric Specialty Exam:  Presentation  General Appearance: Disheveled  Eye Contact:Good  Speech: Rapid, less pressured  Speech Volume:Normal  Handedness:Right   Mood and Affect  Mood: Anxious, depressed  Affect:Full Range   Thought Process  Thought Processes:Disorganized  Descriptions of Associations:Tangential  Orientation:Full (Time, Place and Person)  Thought Content:Tangential; Paranoid Ideation  History of Schizophrenia/Schizoaffective disorder:No  Duration of Psychotic Symptoms:Less than six months  Hallucinations:Hallucinations: +AH  Ideas of Reference:Paranoia  Suicidal Thoughts:denies  Homicidal  Thoughts:denies   Sensorium  Memory:Immediate Fair; Recent Fair;  Remote Fair  Judgment:Poor  Insight:Poor   Executive Functions  Concentration:Poor  Attention Span:Poor  [place]  Language:Good   Psychomotor Activity  Psychomotor Activity:psychomotor agitation    Assets  Assets:Communication Skills; Resilience; Social Support   Sleep  Sleep: improving     Physical Exam: Physical Exam Vitals and nursing note reviewed.  Constitutional:      General: She is not in acute distress.    Appearance: Normal appearance. She is not ill-appearing, toxic-appearing or diaphoretic.  Neurological:     Mental Status: She is alert and oriented to person, place, and time.   Review of Systems  Constitutional:  Negative for chills and fever.  Respiratory:  Negative for cough and shortness of breath.   Cardiovascular:  Negative for chest pain.  Gastrointestinal:  Negative for abdominal pain, constipation, diarrhea, nausea and vomiting.  Neurological:  Negative for dizziness and headaches.  Psychiatric/Behavioral:  Negative for depression, hallucinations and suicidal ideas. The patient is not nervous/anxious.    Blood pressure 128/85, pulse 87, temperature 97.7 F (36.5 C), temperature source Oral, resp. rate 18, height '5\' 4"'$  (1.626 m), weight 86.2 kg, SpO2 100 %. Body mass index is 32.61 kg/m.   Treatment Plan Summary:Patient is a 56 year old female with a psychiatric history of "anxiety I think that is all" who was admitted to this psychiatric unit from [facility] for evaluation psychotic symptoms and manic symptoms.  Daily contact with patient to assess and evaluate symptoms and progress in treatment -Bipolar 1 disorder, severe, current episode manic versus brief psychotic disorder versus substance-induced mood disorder versus substance-induced psychotic disorder -GAD -PTSD -Seizure disorder by history   Psychiatric Diagnoses and Treatment:  -Start haldol 2.5 mg bid    -Continue Zyprexa  10 mg twice daily for mood  stabilization and psychotic thinking. -Continue Keppra 500 mg twice daily for seizure disorder -Continue Depakote sprinkles 500 mg twice daily for mood stabilization.  01-13-2025 Valproic acid level 77. -Continue propranolol ER 60 mg once daily for anxiety and tachycardia -Continue haldol to 5 mg bid -for mood instability and psychotic symptoms -Continue ativan 0.5 mg bid for anxiety  -Start lithium CR 300 mg twice daily-for bipolar disorder  CT head negative in July 2022             -- Metabolic profile and EKG monitoring obtained while on an atypical antipsychotic (BMI: Lipid Panel: HbgA1c: QTc:) 413                          3. Medical Issues Being Addressed:              Tobacco Use Disorder             -- Nicotine patch '21mg'$ /24 hours ordered             -- Smoking cessation encouraged              Vitamin D deficiency              -Continue vitamin D 50,000 Iunits weekly for 6 weeks.  4. Discharge Planning: Social work and case management to assist with discharge planning and identification of hospital follow-up needs prior to discharge.  Per mom, pt cannot return home to live with her.   Christoper Allegra, MD 01/13/2022, 12:18 PM  Total Time Spent in Direct Patient Care:  I personally spent 30 minutes on the  unit in direct patient care. The direct patient care time included face-to-face time with the patient, reviewing the patient's chart, communicating with other professionals, and coordinating care. Greater than 50% of this time was spent in counseling or coordinating care with the patient regarding goals of hospitalization, psycho-education, and discharge planning needs.   Janine Limbo, MD Psychiatrist

## 2022-01-13 NOTE — Group Note (Signed)
Recreation Therapy Group Note ? ? ?Group Topic:Coping Skills  ?Group Date: 01/13/2022 ?Start Time: 1000 ?End Time: 1100 ?Facilitators: Victorino Sparrow, LRT,CTRS ?Location: [place] ? ? ?Goal Area(s) Addresses:  ?Patient will identify positive stress management techniques. ?Patient will identify benefits of using stress management post d/c. ? ?Group Description:  Meditation.  LRT played a meditation that focused on using your day to restore, forgive and show grace to yourself and others.  The meditation also focused on loving every part of yourself flaws and all.  Patients were to listen and follow along with the meditation as it played to fully engage in activity. ? ? ?Affect/Mood: Appropriate ?  ?Participation Level: Engaged ?  ?Participation Quality: Independent ?  ?Behavior: Appropriate ?  ?Speech/Thought Process: Rational ?  ?Insight: Good ?  ?Judgement: Good ?  ?Modes of Intervention: Group work and Music ?  ?Patient Response to Interventions:  Engaged ?  ?Education Outcome: ? Acknowledges education and In group clarification offered   ? ?Clinical Observations/Individualized Feedback: Pt was much more rational in speech.  Pt was attentive and engaged during group session.  Pt identified her most used coping skill is "sitting in quiet".  Pt was able to identify two coping skills for depression as crafts and quilting.  ? ? ?Plan: Continue to engage patient in RT group sessions 2-3x/week. ? ? ?Victorino Sparrow, LRT,CTRS ?01/13/2022 2:23 PM ?

## 2022-01-13 NOTE — BHH Counselor (Signed)
CSW spoke with Misty Howell at [facility] who states she has been out of the office and has not had a chance to review this patients FL-2.  ? ?She reports that once she reviews the FL-2 today she will reach back out with further questions/determination.  ? ? ? ?Darletta Moll MSW, LCSW ?Clincal Social Worker  ?[facility]  ?

## 2022-01-13 NOTE — BHH Group Notes (Signed)
Adult Psychoeducational Group Note ? ?Date:  01/13/2022 ?Time:  8:42 PM ? ?Group Topic/Focus:  ?Wrap-Up Group:   The focus of this group is to help patients review their daily goal of treatment and discuss progress on daily workbooks. ? ?Participation Level:  Active ? ?Participation Quality:  Appropriate and Attentive ? ?Affect:  Appropriate ? ?Cognitive:  Alert and Appropriate ? ?Insight: Appropriate and Good ? ?Engagement in Group:  Engaged ? ?Modes of Intervention:  Discussion and Education ? ?Additional Comments:  Pt attended and participated in wrap up group this evening and rated their day an 8/10. Pt stated that they went to groups today and that they read also. Pt has an ongoing goal to be a better person, and mom. Pt states that their discharge plan is in progress and that they "have a little bit more steps to take" before they will be able to discharge.    ? ?Cristi Loron ?01/13/2022, 8:42 PM ?

## 2022-01-13 NOTE — Progress Notes (Signed)
Pt stated she was trying to do what she is supposed to do and stay out of trouble on the unit ? ? ? 01/13/22 2100  ?Psych Admission Type (Psych Patients Only)  ?Admission Status Involuntary  ?Psychosocial Assessment  ?Patient Complaints Anxiety  ?Eye Contact Fair  ?Facial Expression Anxious  ?Affect Preoccupied  ?Speech Slow;Logical/coherent  ?Interaction Assertive  ?Motor Activity Slow  ?Appearance/Hygiene Unremarkable  ?Behavior Characteristics Anxious  ?Mood Preoccupied  ?Aggressive Behavior  ?Effect No apparent injury  ?Thought Process  ?Coherency Concrete thinking  ?Content WDL  ?Delusions Paranoid  ?Perception Hallucinations  ?Hallucination Auditory  ?Judgment Impaired  ?Confusion WDL  ?Danger to Self  ?Current suicidal ideation? Denies  ?Danger to Others  ?Danger to Others None reported or observed  ? ? ?

## 2022-01-13 NOTE — Progress Notes (Signed)
?   01/13/22 0000  ?Psych Admission Type (Psych Patients Only)  ?Admission Status Involuntary  ?Psychosocial Assessment  ?Patient Complaints Anxiety  ?Eye Contact Fair  ?Facial Expression Animated  ?Affect Preoccupied  ?Speech Tangential  ?Interaction Assertive  ?Motor Activity Restless  ?Appearance/Hygiene Unremarkable  ?Behavior Characteristics Anxious  ?Mood Preoccupied  ?Thought Process  ?Coherency Disorganized  ?Content WDL  ?Delusions Paranoid  ?Perception Hallucinations  ?Hallucination Auditory  ?Judgment Impaired  ?Confusion Mild  ?Danger to Self  ?Current suicidal ideation? Denies  ?Danger to Others  ?Danger to Others None reported or observed  ?Danger to Others Abnormal  ?Destructive Behavior No threats or harm toward property  ? ?Patient alert and oriented x 3. Patient denies SI, HI and AVH. Patient denies pain, now in bed appears asleep no apparent distress noted ?

## 2022-01-13 NOTE — BHH Group Notes (Addendum)
?  Spirituality group facilitated by Kathrynn Humble, [place].  ? ?Group Description: Group focused on topic of hope. Patients participated in facilitated discussion around topic, connecting with one another around experiences and definitions for hope. Group members engaged with visual explorer photos, reflecting on what hope looks like for them today. Group engaged in discussion around how their definitions of hope are present today in hospital.  ? ?Modalities: Psycho-social ed, Adlerian, Narrative, MI  ? ?Patient Progress: Patient was encouraged but did not attend group. ? ?[employer], Bcc ?Pager, [phone] ? ?

## 2022-01-13 NOTE — Progress Notes (Signed)
Pt was encouraged but didn't attend therapeutic relaxation group. ?

## 2022-01-13 NOTE — Progress Notes (Signed)
?   01/13/22 0815  ?Psych Admission Type (Psych Patients Only)  ?Admission Status Involuntary  ?Psychosocial Assessment  ?Patient Complaints Anxiety  ?Eye Contact Fair  ?Facial Expression Animated  ?Affect Preoccupied  ?Speech Slow;Tangential  ?Interaction Assertive  ?Motor Activity Restless  ?Appearance/Hygiene Unremarkable  ?Behavior Characteristics Anxious;Cooperative  ?Mood Preoccupied  ?Thought Process  ?Coherency Disorganized  ?Content WDL  ?Delusions Paranoid  ?Perception Hallucinations  ?Hallucination Auditory  ?Judgment Impaired  ?Confusion Mild  ?Danger to Self  ?Current suicidal ideation? Denies  ?Danger to Others  ?Danger to Others None reported or observed  ?Danger to Others Abnormal  ?Harmful Behavior to others No threats or harm toward other people  ?Destructive Behavior No threats or harm toward property  ? ? ?

## 2022-01-14 MED ORDER — LORAZEPAM 0.5 MG PO TABS
0.2500 mg | ORAL_TABLET | Freq: Two times a day (BID) | ORAL | Status: DC
  Administered 2022-01-14 – 2022-01-15 (×2): 0.25 mg via ORAL
  Filled 2022-01-14 (×2): qty 1

## 2022-01-14 NOTE — Progress Notes (Signed)
Pt denies SI/HI/AVH and verbally agrees to approach staff if these become apparent or before harming themselves/others. Rates depression 0/10. Rates anxiety 3/10. Rates pain 0/10. Pt has been isolative in her room but has been out a little bit. Pt wrote on her self-evaluation sheet about her sons. When RN spoke to the pt, pt seemed to have a little more insight and stated, "maybe I just made a mistake about what I saw." Pt sister called and would like to speak with tx team. Sister has consent and phone is in note. Scheduled medications administered to pt, per MD orders. RN provided support and encouragement to pt. Q15 min safety checks implemented and continued. Pt safe on the unit. RN will continue to monitor and intervene as needed.  ? 01/14/22 0347  ?Psych Admission Type (Psych Patients Only)  ?Admission Status Involuntary  ?Psychosocial Assessment  ?Patient Complaints Anxiety  ?Eye Contact Fair  ?Facial Expression Flat  ?Affect Flat;Preoccupied  ?Speech Logical/coherent;Slow  ?Interaction Assertive;Forwards little  ?Motor Activity Slow  ?Appearance/Hygiene Unremarkable  ?Behavior Characteristics Cooperative;Appropriate to situation;Anxious  ?Mood Anxious;Pleasant;Preoccupied  ?Thought Process  ?Coherency Disorganized  ?Content Preoccupation  ?Delusions None reported or observed  ?Perception WDL  ?Hallucination None reported or observed  ?Judgment Impaired  ?Confusion None  ?Danger to Self  ?Current suicidal ideation? Denies  ?Danger to Others  ?Danger to Others None reported or observed  ?Danger to Others Abnormal  ?Harmful Behavior to others No threats or harm toward other people  ?Destructive Behavior No threats or harm toward property  ? ? ?

## 2022-01-14 NOTE — Progress Notes (Addendum)
[facility] MD Progress Note  01/14/2022 1:08 PM Misty Howell  MRN:  [id number]  Subjective:    Patient is a 56 year old female with a psychiatric history of "anxiety I think that is all" who was admitted to this psychiatric unit from [facility] for evaluation psychotic symptoms and manic symptoms.   Yesterday, the psychiatry team made the following recommendations: -Continue Zyprexa  10 mg twice daily for mood stabilization and psychotic thinking. -Continue Keppra 500 mg twice daily for seizure disorder -Continue Depakote sprinkles 500 mg twice daily for mood stabilization.  01-13-2025 Valproic acid level 77. -Continue propranolol ER 60 mg once daily for anxiety and tachycardia -Continue haldol to 5 mg bid -for mood instability and psychotic symptoms -Continue ativan 0.5 mg bid for anxiety  -Start lithium CR 300 mg twice daily-for bipolar disorder  On my assessment today, the patient is initially more organized and linear in her thoughts.  As the interview progressed, her thoughts became more disorganized, tangential, and paranoid. Patient reports that her mood is better and less flat and sad.  She reports anxiety is some less.   She continues to believe that her living son is safe but she does not know about her other son, Misty Howell (whom the patient believes is alive, but is actually deceased).  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 As interview progresses, she begins to fixate on her own safety and "getting in trouble". She reports auditory hallucinations are less, and for the first time today asked this Probation officer "are the dreams". She denies having suicidal thoughts.  Denies having homicidal thoughts.  She denies have any adverse effect to her currently prescribed  scheduled psychiatric medications.   Principal Problem: Bipolar I disorder, current or most recent episode manic, severe ([place]) Diagnosis: Principal Problem:   Bipolar I disorder, current or most recent episode manic, severe ([facility]) Active Problems:   Acute psychosis ([place])   GAD (generalized anxiety disorder)   PTSD (post-traumatic stress disorder)  Total Time spent with patient: 20 minutes  Past Psychiatric History: See H&P  Past Medical History:  Past Medical History:  Diagnosis Date   Anxiety    Cancer ([place])    Giant cell tumor left fifth finger.   Hypercholesterolemia    Overweight(278.02)    Seizures ([facility])    Syncope and collapse    Vision abnormalities    Vitamin D deficiency     Past Surgical History:  Procedure Laterality Date   CESAREAN SECTION     FINGER SURGERY     due to giant cell tumor, hip bone placed in finger   Family History:  Family History  Problem Relation Age of Onset   Hypertension Mother    Migraines Mother    Hypertension Father    Family Psychiatric  History: See H&P Social History:  Social History   Substance and Sexual Activity  Alcohol Use Not Currently   Comment: social     Social History   Substance and Sexual Activity  Drug Use Yes   Types: Marijuana   Comment: Pt stated she used Delta 8 a couple weeks ago    Social History   Socioeconomic History   Marital status: Legally Separated    Spouse name: Not on file  Number of children: Not on file   Years of education: Not on file   Highest education level: Not on file  Occupational History   Occupation: Finance  Tobacco Use   Smoking status: Former    Packs/day: 0.00    Years: 3.00    Pack years: 0.00    Types: Cigarettes    Quit date: 01/20/1989    Years since quitting: 33.0   Smokeless tobacco: Never  Vaping Use   Vaping Use: Never used  Substance and Sexual Activity   Alcohol use: Not Currently    Comment: social   Drug use: Yes    Types: Marijuana     Comment: Pt stated she used Delta 8 a couple weeks ago   Sexual activity: Not Currently  Other Topics Concern   Not on file  Social History Narrative   Pt lives in [place] with mother and two teenaged sons.  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  Not followed by an outpatient psych provider.   Social Determinants of Health   Financial Resource Strain: Not on file  Food Insecurity: Not on file  Transportation Needs: Not on file  Physical Activity: Not on file  Stress: Not on file  Social Connections: Not on file   Additional Social History:                         Sleep: Fair  Appetite:  Fair  Current Medications: Current Facility-Administered Medications  Medication Dose Route Frequency Provider Last Rate Last Admin   acetaminophen (TYLENOL) tablet 650 mg  650 mg Oral Q6H PRN Ethelene Hal, NP       alum & mag hydroxide-simeth (MAALOX/MYLANTA) 200-200-20 MG/5ML suspension 30 mL  30 mL Oral Q4H PRN Ethelene Hal, NP       divalproex (DEPAKOTE SPRINKLE) capsule 500 mg  500 mg Oral Q12H Malin Sambrano, Ovid Curd, MD   500 mg at 01/14/22 7169   haloperidol (HALDOL) tablet 5 mg  5 mg Oral BID Janine Limbo, MD   5 mg at 01/14/22 6789   hydrOXYzine (ATARAX) tablet 25 mg  25 mg Oral TID PRN Ethelene Hal, NP   25 mg at 01/10/22 0848   levETIRAcetam (KEPPRA) tablet 500 mg  500 mg Oral BID Janine Limbo, MD   500 mg at 01/14/22 3810   lithium carbonate (LITHOBID) CR tablet 300 mg  300 mg Oral Q12H Samuell Knoble, Ovid Curd, MD   300 mg at 01/14/22 1751   LORazepam (ATIVAN) tablet 0.5 mg  0.5 mg Oral BID Takila Kronberg, Ovid Curd, MD   0.5 mg at 01/14/22 0823   OLANZapine zydis (ZYPREXA) disintegrating tablet 5 mg  5 mg Oral Q8H PRN Montavis Schubring, Ovid Curd, MD       And   LORazepam (ATIVAN) tablet 1 mg  1 mg Oral PRN Shemika Robbs, Ovid Curd, MD       And   ziprasidone (GEODON) injection 20 mg  20 mg Intramuscular PRN Lemoyne Scarpati, Ovid Curd, MD       magnesium hydroxide (MILK OF  MAGNESIA) suspension 30 mL  30 mL Oral Daily PRN Ethelene Hal, NP       OLANZapine zydis (ZYPREXA) disintegrating tablet 10 mg  10 mg Oral BID Mia Milan, Ovid Curd, MD   10 mg at 01/14/22 0258   propranolol ER (INDERAL LA) 24 hr capsule 60 mg  60 mg Oral Daily Timiko Offutt, Ovid Curd, MD   60 mg at 01/14/22 5277   traZODone (DESYREL) tablet 50 mg  50 mg  Oral QHS PRN Ethelene Hal, NP   50 mg at 01/13/22 2043   Vitamin D (Ergocalciferol) (DRISDOL) capsule 50,000 Units  50,000 Units Oral Q7 days [name], MD   50,000 Units at 01/08/22 1711    Lab Results:  Results for orders placed or performed during the hospital encounter of 01/06/22 (from the past 48 hour(s))  Valproic acid level     Status: None   Collection Time: 01/13/22  6:24 AM  Result Value Ref Range   Valproic Acid Lvl 77 50.0 - 100.0 ug/mL    Comment: Performed at [facility], [place] [address] [place], [place] [address]    Blood Alcohol level:  Lab Results  Component Value Date   ETH <10 01/03/2022   ETH <10 38/88/2800    Metabolic Disorder Labs: Lab Results  Component Value Date   HGBA1C 5.6 01/07/2022   MPG 114.02 01/07/2022   MPG 108.28 08/08/2017   No results found for: PROLACTIN Lab Results  Component Value Date   CHOL 154 01/07/2022   TRIG 153 (H) 01/07/2022   HDL 49 01/07/2022   CHOLHDL 3.1 01/07/2022   VLDL 31 01/07/2022   LDLCALC 74 01/07/2022   LDLCALC 121 (H) 04/13/2009    Physical Findings: AIMS: Facial and Oral Movements Muscles of Facial Expression: None, normal Lips and Perioral Area: None, normal Jaw: None, normal Tongue: None, normal,Extremity Movements Upper (arms, wrists, hands, fingers): None, normal Lower (legs, knees, ankles, toes): None, normal, Trunk Movements Neck, shoulders, hips: None, normal, Overall Severity Severity of abnormal movements (highest score from questions above): None, normal Incapacitation due to abnormal movements: None,  normal Patient's awareness of abnormal movements (rate only patient's report): No Awareness, Dental Status Current problems with teeth and/or dentures?: No Does patient usually wear dentures?: No  CIWA:    COWS:     Musculoskeletal: Strength & Muscle Tone: within normal limits Gait & Station: normal Patient leans: N/A  Psychiatric Specialty Exam:  Presentation  General Appearance: Disheveled  Eye Contact:Good  Speech:Pressured  Speech Volume:Normal  Handedness:Right   Mood and Affect  Mood:Anxious; Dysphoric; Labile  Affect:Full Range   Thought Process  Thought Processes:Goal Directed  Descriptions of Associations:Tangential  Orientation:Full (Time, Place and Person)  Thought Content:Tangential; Paranoid Ideation  History of Schizophrenia/Schizoaffective disorder:No  Duration of Psychotic Symptoms:Less than six months  Hallucinations:Hallucinations: Auditory  Ideas of Reference:Delusions; Paranoia  Suicidal Thoughts:Suicidal Thoughts: No  Homicidal Thoughts:Homicidal Thoughts: No   Sensorium  Memory:Immediate Fair; Recent Fair; Remote Fair  Judgment:Fair  Insight:Poor   Executive Functions  Concentration:Poor  Attention Span:Poor  [place]  Language:Good   Psychomotor Activity  Psychomotor Activity:Psychomotor Activity: Restlessness   Assets  Assets:Communication Skills; Resilience; Social Support   Sleep  Sleep:Sleep: Fair    Physical Exam: Physical Exam Vitals reviewed.  Constitutional:      General: She is not in acute distress.    Appearance: She is normal weight. She is not toxic-appearing.  Pulmonary:     Effort: Pulmonary effort is normal.  Neurological:     Mental Status: She is alert.     Motor: No weakness.     Gait: Gait normal.    Review of Systems  Constitutional:  Negative for chills and fever.  Cardiovascular:  Negative for chest pain and palpitations.  Neurological:   Negative for dizziness, tingling, tremors and headaches.  Psychiatric/Behavioral:  Positive for depression and hallucinations. The patient is nervous/anxious.   Blood pressure 104/81, pulse 94, temperature  97.6 F (36.4 C), temperature source Oral, resp. rate 18, height '5\' 4"'$  (1.626 m), weight 86.2 kg, SpO2 98 %. Body mass index is 32.61 kg/m.   Treatment Plan Summary: Patient is a 56 year old female with a psychiatric history of "anxiety I think that is all" who was admitted to this psychiatric unit from [facility] for evaluation psychotic symptoms and manic symptoms.   Daily contact with patient to assess and evaluate symptoms and progress in treatment.  Assessment: -Bipolar 1 disorder, severe, current episode manic versus brief psychotic disorder versus substance-induced mood disorder versus substance-induced psychotic disorder -GAD -PTSD -Seizure disorder by history   Psychiatric Diagnoses and Treatment:  -Start haldol 2.5 mg bid    -Continue Zyprexa  10 mg twice daily for mood stabilization and psychotic thinking. -Continue Keppra 500 mg twice daily for seizure disorder -Continue Depakote sprinkles 500 mg twice daily for mood stabilization.  01-13-2025 Valproic acid level 77. -Continue propranolol ER 60 mg once daily for anxiety and tachycardia -Continue haldol to 5 mg bid -for mood instability and psychotic symptoms -DECREASE ativan from 0.5 mg bid to 0.25 mg bid - for anxiety and mood stabilization. Will decrease due to potentially perpetuating confusion that is not 2/2 psychotic thinking  -Continue lithium CR 300 mg twice daily-for bipolar disorder   CT head negative in July 2022             -- Metabolic profile and EKG monitoring obtained while on an atypical antipsychotic (BMI: Lipid Panel: HbgA1c: QTc:) 413                          3. Medical Issues Being Addressed:              Tobacco Use Disorder             -- Nicotine patch '21mg'$ /24 hours ordered             -- Smoking  cessation encouraged              Vitamin D deficiency              -Continue vitamin D 50,000 Iunits weekly for 6 weeks.   4. Discharge Planning: Social work and case management to assist with discharge planning and identification of hospital follow-up needs prior to discharge.  Per mom, pt cannot return home to live with her.    Christoper Allegra, MD 01/13/2022, 12:18 PM   Total Time Spent in Direct Patient Care:  I personally spent 30 minutes on the unit in direct patient care. The direct patient care time included face-to-face time with the patient, reviewing the patient's chart, communicating with other professionals, and coordinating care. Greater than 50% of this time was spent in counseling or coordinating care with the patient regarding goals of hospitalization, psycho-education, and discharge planning needs.    Janine Limbo, MD Psychiatrist

## 2022-01-14 NOTE — BHH Group Notes (Signed)
Adult Psychoeducational Group Note ? ?Date:  01/14/2022 ?Time:  9:17 PM ? ?Group Topic/Focus:  ?Wrap-Up Group:   The focus of this group is to help patients review their daily goal of treatment and discuss progress on daily workbooks. ? ?Participation Level:  Active ? ?Participation Quality:  Appropriate and Attentive ? ?Affect:  Appropriate ? ?Cognitive:  Alert and Appropriate ? ?Insight: Appropriate and Good ? ?Engagement in Group:  Engaged ? ?Modes of Intervention:  Discussion and Education ? ?Additional Comments:  Pt attended and participated in wrap up group this evening and rated their day an 8/10. Pt stated that they went to groups and stated that they had no stress, went outside and had positive thoughts. Pt stated that their ongoing goal is to be a better person, listener and to apologize when they make mistakes. Pt D/C plan is still in progress.  ? ?Cristi Loron ?01/14/2022, 9:17 PM ?

## 2022-01-14 NOTE — Progress Notes (Signed)
?   01/14/22 0530  ?Sleep  ?Number of Hours 7  ? ? ?

## 2022-01-14 NOTE — BHH Counselor (Signed)
CSW spoke with patients sister, Langley Gauss [phone]) who reiterated this patient is not able to return to live with mother. She is hopeful this patient will be able to discharge to [facility]. She also found an [place] house that this patient may be able to discharge to as an alternative. She spoke to [place] [phone]) with the [facility] who reports they have an opening. Moving fees are $250 and it is $125 weekly for rent. Pt's sister is open to paying these fees for her sister for a week or two until this patient is able to find a job. ? ? ? ?Darletta Moll MSW, LCSW ?Clincal Social Worker  ?[facility]  ?

## 2022-01-14 NOTE — Progress Notes (Signed)
?   01/14/22 2300  ?Psych Admission Type (Psych Patients Only)  ?Admission Status Involuntary  ?Psychosocial Assessment  ?Patient Complaints Anxiety  ?Eye Contact Fair  ?Facial Expression Flat  ?Affect Flat  ?Speech Logical/coherent  ?Interaction Assertive  ?Motor Activity Slow  ?Appearance/Hygiene Unremarkable  ?Behavior Characteristics Cooperative  ?Mood Anxious;Pleasant  ?Aggressive Behavior  ?Effect No apparent injury  ?Thought Process  ?Coherency Disorganized  ?Content Preoccupation  ?Delusions None reported or observed  ?Perception WDL  ?Hallucination None reported or observed  ?Judgment Impaired  ?Confusion None  ?Danger to Self  ?Current suicidal ideation? Denies  ?Danger to Others  ?Danger to Others None reported or observed  ?Danger to Others Abnormal  ?Harmful Behavior to others No threats or harm toward other people  ?Destructive Behavior No threats or harm toward property  ? ? ?

## 2022-01-15 LAB — LITHIUM LEVEL: Lithium Lvl: 0.38 mmol/L — ABNORMAL LOW (ref 0.60–1.20)

## 2022-01-15 MED ORDER — LITHIUM CARBONATE ER 450 MG PO TBCR
450.0000 mg | EXTENDED_RELEASE_TABLET | Freq: Two times a day (BID) | ORAL | Status: DC
Start: 2022-01-15 — End: 2022-01-17
  Administered 2022-01-15 – 2022-01-17 (×4): 450 mg via ORAL
  Filled 2022-01-15 (×8): qty 1

## 2022-01-15 MED ORDER — LORAZEPAM 0.5 MG PO TABS
0.2500 mg | ORAL_TABLET | Freq: Two times a day (BID) | ORAL | Status: AC
  Administered 2022-01-15: 0.25 mg via ORAL
  Filled 2022-01-15: qty 1

## 2022-01-15 NOTE — Group Note (Signed)
Recreation Therapy Group Note ? ? ?Group Topic:Health and Wellness  ?Group Date: 01/15/2022 ?Start Time: 1000 ?End Time: 1610 ?Facilitators: Victorino Sparrow, LRT,CTRS ?Location: [place] ? ? ?Goal Area(s) Addresses:  ?Patient will define components of whole wellness. ?Patient will verbalize benefit of whole wellness. ? ?Group Description:  Exercise.  LRT and patients discussed the components of wellness.  LRT then lead patients in a series of stretches.  Patients then took turns leading group in exercises or dance moves of their choosing (except twerking and any other inappropriate dance moves).  The goal was to get at least 30 minutes of movement so patients could release any stress, tension or just feel better.  Patients were encouraged to take breaks and get water as needed.  ? ? ?Affect/Mood: Appropriate ?  ?Participation Level: Engaged ?  ?Participation Quality: Independent ?  ?Behavior: Appropriate ?  ?Speech/Thought Process: Focused ?  ?Insight: Good ?  ?Judgement: Good ?  ?Modes of Intervention: Music and Exercise ?  ?Patient Response to Interventions:  Engaged ?  ?Education Outcome: ? Acknowledges education and In group clarification offered   ? ?Clinical Observations/Individualized Feedback: Pt was appeared a little flat at first but brightened up.  Pt was attentive and participated in the exercises presented and led a few as well.  Pt was appropriate during activity.  Pt wasn't rambling about things that had nothing to do with the activity.  Pt was able to focus throughout.  ? ? ?Plan: Continue to engage patient in RT group sessions 2-3x/week. ? ? ?Victorino Sparrow, LRT,CTRS ?01/15/2022 12:37 PM ?

## 2022-01-15 NOTE — Progress Notes (Signed)
Pt stated she had ok day, pt said she felt better this evening, pt had brighter affect and was a little more talkative ? ? ? 01/15/22 2200  ?Psych Admission Type (Psych Patients Only)  ?Admission Status Involuntary  ?Psychosocial Assessment  ?Patient Complaints Anxiety  ?Eye Contact Fair  ?Facial Expression Anxious;Sad  ?Affect Sad  ?Speech Logical/coherent  ?Interaction Assertive  ?Motor Activity Slow  ?Appearance/Hygiene Unremarkable  ?Behavior Characteristics Cooperative  ?Mood Anxious;Pleasant  ?Aggressive Behavior  ?Effect No apparent injury  ?Thought Process  ?Coherency Circumstantial  ?Content WDL  ?Delusions None reported or observed  ?Perception WDL  ?Hallucination None reported or observed  ?Judgment Impaired  ?Confusion None  ?Danger to Self  ?Current suicidal ideation? Denies  ?Danger to Others  ?Danger to Others None reported or observed  ?Danger to Others Abnormal  ?Harmful Behavior to others No threats or harm toward other people  ?Destructive Behavior No threats or harm toward property  ? ? ?

## 2022-01-15 NOTE — Progress Notes (Signed)
The focus of this group is to help patients review their daily goal of treatment and discuss progress on daily workbooks. ? ?Pt attended the evening group and responded to all discussion prompts from the [place]. Pt shared that today was a good day on the unit, the highlight of which was a visit from her Mother, whom she considers very supportive. ? ?Pt told that their goal for this week was to figure out their aftercare living situation. "I'm not sure where I'm going yet, but I'm going to need my own space." Pt felt optimistic that this would be resolved soon. ? ?Pt rated her day an 8 out of 10 and her affect was appropriate. ?

## 2022-01-15 NOTE — Group Note (Signed)
LCSW Group Therapy Note ? ? ?Group Date: 01/15/2022 ?Start Time: 1300 ?End Time: 1400 ? ? ?Type of Therapy and Topic:  Group Therapy:  Strengths Exploration ? ? ?Participation Level: Active ? ?Description of Group: ?This group allows individuals to explore their strengths, learn to use strengths in new ways to improve well-being. Strengths-based interventions involve identifying strengths, understanding how they are used, and learning new ways to apply them. Individuals will identify their strengths, and then explore their roles in different areas of life (relationships, professional life, and personal fulfillment). Individuals will think about ways in which they currently use their strengths, along with new ways they could begin using them. ? ?  ?Therapeutic Goals ?Patient will verbalize two of their strengths ?Patient will identify how their strengths are currently used ?Patient will identify two new ways to apply their strengths  ?Patients will create a plan to apply their strengths in their daily lives  ?  ? ?Summary of Patient Progress:  Patient attended and participated in group. Patient shared that her strengths are that she is courageous, open-minded, and humble.  ? ?  ? ?Therapeutic Modalities ?Cognitive Behavioral Therapy ?Motivational Interviewing ? ? ?Darletta Moll MSW, LCSW ?Clincal Social Worker  ?[facility]  ?

## 2022-01-15 NOTE — BHH Counselor (Signed)
CSW left a message for Tammy with [facility] regarding possible placement for this patient.  ? ? ? ? ?Darletta Moll MSW, LCSW ?Clincal Social Worker  ?[facility]  ?

## 2022-01-15 NOTE — Progress Notes (Signed)
Pt denies SI/HI/AVH and verbally agrees to approach staff if these become apparent or before harming themselves/others. Rates depression 0/10. Rates anxiety 2/10. Rates pain 0/10. Pt is still isolative but thought process is clearing. Pt has been attending groups and going to meals with no issue. Scheduled medications administered to pt, per MD orders. RN provided support and encouragement to pt. Q15 min safety checks implemented and continued. Pt safe on the unit. RN will continue to monitor and intervene as needed.  ? 01/15/22 0818  ?Psych Admission Type (Psych Patients Only)  ?Admission Status Involuntary  ?Psychosocial Assessment  ?Patient Complaints Anxiety  ?Eye Contact Fair  ?Facial Expression Flat  ?Affect Appropriate to circumstance;Flat  ?Speech Logical/coherent  ?Interaction Assertive;Forwards little;Isolative  ?Motor Activity Slow  ?Appearance/Hygiene Unremarkable  ?Behavior Characteristics Cooperative;Calm;Appropriate to situation  ?Mood Anxious;Pleasant  ?Thought Process  ?Coherency Disorganized  ?Content WDL  ?Delusions None reported or observed  ?Perception WDL  ?Hallucination None reported or observed  ?Judgment Impaired  ?Confusion None  ?Danger to Self  ?Current suicidal ideation? Denies  ?Danger to Others  ?Danger to Others None reported or observed  ?Danger to Others Abnormal  ?Harmful Behavior to others No threats or harm toward other people  ?Destructive Behavior No threats or harm toward property  ? ? ?

## 2022-01-15 NOTE — Progress Notes (Signed)
Pt was encouraged but didn't attend therapeutic relaxation group. ?

## 2022-01-15 NOTE — Progress Notes (Signed)
[facility] MD Progress Note  01/15/2022 12:21 PM PEGAH SEGEL  MRN:  [id number]  Subjective:    Patient is a 56 year old female with a psychiatric history of "anxiety I think that is all" who was admitted to this psychiatric unit from [facility] for evaluation psychotic symptoms and manic symptoms.   Yesterday, the psychiatry team made the following recommendations: -Continue Zyprexa  10 mg twice daily for mood stabilization and psychotic thinking. -Continue Keppra 500 mg twice daily for seizure disorder -Continue Depakote sprinkles 500 mg twice daily for mood stabilization.  01-13-2025 Valproic acid level 77. -Continue propranolol ER 60 mg once daily for anxiety and tachycardia -Continue haldol to 5 mg bid -for mood instability and psychotic symptoms -DECREASE ativan from 0.5 mg bid to 0.25 mg bid - for anxiety and mood stabilization. Will decrease due to potentially perpetuating confusion that is not 2/2 psychotic thinking  -Continue lithium CR 300 mg twice daily-for bipolar disorder  On my assessment today, the patient is tearful.  Her thoughts are more linear and logical, and less tangential.  She is concerned about her living son's wellbeing.  She does not like the phone with her sister, and the patient reports that she can live with after discharge. She reports that her mood is better and less depressed.  She reports that anxiety is still in an elevated level, but less than yesterday.  She reports that her thoughts are more clear and she is able to understand what is going on better.  She reports that she is still worried about her slapjack.  She is starting to challenge delusions such as her deceased son still living who she reports "my mind is misinterpreting things I think".  She is less paranoid. She reports that sleep is improving.  She reports appetite is stable. Denies SI.  Denies HI. She denies AH, which is an improvement.  Denies VH. She denies side effects to currently prescribed  psychiatric medications.    Principal Problem: Bipolar I disorder, current or most recent episode manic, severe ([place]) Diagnosis: Principal Problem:   Bipolar I disorder, current or most recent episode manic, severe ([facility]) Active Problems:   Acute psychosis ([place])   GAD (generalized anxiety disorder)   PTSD (post-traumatic stress disorder)  Total Time spent with patient: 20 minutes  Past Psychiatric History: See H&P  Past Medical History:  Past Medical History:  Diagnosis Date   Anxiety    Cancer ([place])    Giant cell tumor left fifth finger.   Hypercholesterolemia    Overweight(278.02)    Seizures ([facility])    Syncope and collapse    Vision abnormalities    Vitamin D deficiency     Past Surgical History:  Procedure Laterality Date   CESAREAN SECTION     FINGER SURGERY     due to giant cell tumor, hip bone placed in finger   Family History:  Family History  Problem Relation Age of Onset   Hypertension Mother    Migraines Mother    Hypertension Father    Family Psychiatric  History: See H&P Social History:  Social History   Substance and Sexual Activity  Alcohol Use Not Currently   Comment: social     Social History   Substance and Sexual Activity  Drug Use Yes   Types: Marijuana   Comment: Pt stated she used Delta 8 a couple weeks ago    Social History   Socioeconomic History   Marital status: Legally Separated    Spouse name:  Not on file   Number of children: Not on file   Years of education: Not on file   Highest education level: Not on file  Occupational History   Occupation: Finance  Tobacco Use   Smoking status: Former    Packs/day: 0.00    Years: 3.00    Pack years: 0.00    Types: Cigarettes    Quit date: 01/20/1989    Years since quitting: 33.0   Smokeless tobacco: Never  Vaping Use   Vaping Use: Never used  Substance and Sexual Activity   Alcohol use: Not Currently    Comment: social   Drug use: Yes    Types: Marijuana    Comment: Pt  stated she used Delta 8 a couple weeks ago   Sexual activity: Not Currently  Other Topics Concern   Not on file  Social History Narrative   Pt lives in [place] with mother and two teenaged sons.  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  Not followed by an outpatient psych provider.   Social Determinants of Health   Financial Resource Strain: Not on file  Food Insecurity: Not on file  Transportation Needs: Not on file  Physical Activity: Not on file  Stress: Not on file  Social Connections: Not on file   Additional Social History:                         Sleep: Fair  Appetite:  Fair  Current Medications: Current Facility-Administered Medications  Medication Dose Route Frequency Provider Last Rate Last Admin   acetaminophen (TYLENOL) tablet 650 mg  650 mg Oral Q6H PRN Ethelene Hal, NP       alum & mag hydroxide-simeth (MAALOX/MYLANTA) 200-200-20 MG/5ML suspension 30 mL  30 mL Oral Q4H PRN Ethelene Hal, NP       divalproex (DEPAKOTE SPRINKLE) capsule 500 mg  500 mg Oral Q12H Sandy Haye, Ovid Curd, MD   500 mg at 01/15/22 0818   haloperidol (HALDOL) tablet 5 mg  5 mg Oral BID Refujio Haymer, Ovid Curd, MD   5 mg at 01/15/22 0818   hydrOXYzine (ATARAX) tablet 25 mg  25 mg Oral TID PRN Ethelene Hal, NP   25 mg at 01/10/22 0848   levETIRAcetam (KEPPRA) tablet 500 mg  500 mg Oral BID Tracen Mahler, Ovid Curd, MD   500 mg at 01/15/22 1610   lithium carbonate (LITHOBID) CR tablet 300 mg  300 mg Oral Q12H Kinan Safley, Ovid Curd, MD   300 mg at 01/15/22 9604   LORazepam (ATIVAN) tablet 0.25 mg  0.25 mg Oral BID Imaya Duffy, Ovid Curd, MD   0.25 mg at 01/15/22 0820   OLANZapine zydis (ZYPREXA) disintegrating tablet 5 mg  5 mg Oral Q8H PRN Doren Kaspar, Ovid Curd, MD       And   LORazepam (ATIVAN) tablet 1 mg  1 mg Oral PRN Auda Finfrock, Ovid Curd, MD       And   ziprasidone (GEODON) injection 20 mg  20 mg Intramuscular PRN Makaylie Dedeaux, Ovid Curd, MD       magnesium hydroxide (MILK OF MAGNESIA)  suspension 30 mL  30 mL Oral Daily PRN Ethelene Hal, NP       OLANZapine zydis (ZYPREXA) disintegrating tablet 10 mg  10 mg Oral BID Darleene Cumpian, Ovid Curd, MD   10 mg at 01/15/22 0820   propranolol ER (INDERAL LA) 24 hr capsule 60 mg  60 mg Oral Daily Jonesha Tsuchiya, Ovid Curd, MD   60 mg at 01/15/22 0819   traZODone (DESYREL) tablet  50 mg  50 mg Oral QHS PRN Ethelene Hal, NP   50 mg at 01/14/22 2046   Vitamin D (Ergocalciferol) (DRISDOL) capsule 50,000 Units  50,000 Units Oral Q7 days [name], MD   50,000 Units at 01/08/22 1711    Lab Results:  No results found for this or any previous visit (from the past 48 hour(s)).   Blood Alcohol level:  Lab Results  Component Value Date   ETH <10 01/03/2022   ETH <10 40/98/1191    Metabolic Disorder Labs: Lab Results  Component Value Date   HGBA1C 5.6 01/07/2022   MPG 114.02 01/07/2022   MPG 108.28 08/08/2017   No results found for: PROLACTIN Lab Results  Component Value Date   CHOL 154 01/07/2022   TRIG 153 (H) 01/07/2022   HDL 49 01/07/2022   CHOLHDL 3.1 01/07/2022   VLDL 31 01/07/2022   LDLCALC 74 01/07/2022   LDLCALC 121 (H) 04/13/2009    Physical Findings: AIMS: Facial and Oral Movements Muscles of Facial Expression: None, normal Lips and Perioral Area: None, normal Jaw: None, normal Tongue: None, normal,Extremity Movements Upper (arms, wrists, hands, fingers): None, normal Lower (legs, knees, ankles, toes): None, normal, Trunk Movements Neck, shoulders, hips: None, normal, Overall Severity Severity of abnormal movements (highest score from questions above): None, normal Incapacitation due to abnormal movements: None, normal Patient's awareness of abnormal movements (rate only patient's report): No Awareness, Dental Status Current problems with teeth and/or dentures?: No Does patient usually wear dentures?: No  CIWA:    COWS:     Musculoskeletal: Strength & Muscle Tone: within normal limits Gait &  Station: normal Patient leans: N/A  Psychiatric Specialty Exam:  Presentation  General Appearance: Disheveled Unaware of her appearance, showing cleavage outside of room, not in a provocative manner   Eye Contact:Good  Speech:less pressured  Speech Volume:Normal  Handedness:Right   Mood and Affect  Mood:Anxious; Labile  Affect:Full Range   Thought Process  Thought Processes:Goal Directed  Descriptions of Associations:less Tangential  Orientation:Full (Time, Place and Person)  Thought Content:less paranoia   History of Schizophrenia/Schizoaffective disorder:No  Duration of Psychotic Symptoms:Less than six months  Hallucinations:Hallucinations: denies AH, VH  Ideas of Reference:Delusions and Paranoia are softer   Suicidal Thoughts:Suicidal Thoughts: No  Homicidal Thoughts:Homicidal Thoughts: No   Sensorium  Memory:Immediate Fair; Recent Fair; Remote [place]   Executive Functions  Concentration:Poor  Attention Span:Poor  Recall:Poor  Fund of Knowledge:Fair  Language:Good   Psychomotor Activity  Psychomotor Activity:Psychomotor Activity: Restlessness   Assets  Assets:Communication Skills; Resilience; Social Support   Sleep  Sleep:Sleep: Fair    Physical Exam: Physical Exam Vitals reviewed.  Constitutional:      General: She is not in acute distress.    Appearance: She is not toxic-appearing.  Pulmonary:     Effort: Pulmonary effort is normal.  Neurological:     Mental Status: She is alert.     Motor: No weakness.     Gait: Gait normal.    Review of Systems  Constitutional:  Negative for chills and fever.  Cardiovascular:  Negative for chest pain and palpitations.  Neurological:  Negative for dizziness, tingling, tremors and headaches.  Psychiatric/Behavioral:  Positive for depression, hallucinations and substance abuse. Negative for suicidal ideas. The patient is nervous/anxious. The patient  does not have insomnia.    Blood pressure 117/80, pulse 99, temperature 97.7 F (36.5 C), temperature source Oral, resp. rate 16, height '5\' 4"'$  (1.626 m), weight 86.2 kg,  SpO2 98 %. Body mass index is 32.61 kg/m.   Treatment Plan Summary: Patient is a 56 year old female with a psychiatric history of "anxiety I think that is all" who was admitted to this psychiatric unit from [facility] for evaluation psychotic symptoms and manic symptoms.   Daily contact with patient to assess and evaluate symptoms and progress in treatment.  Assessment: -Bipolar 1 disorder, severe, current episode manic versus brief psychotic disorder versus substance-induced mood disorder versus substance-induced psychotic disorder -GAD -PTSD -Seizure disorder by history   Psychiatric Diagnoses and Treatment:  -Start haldol 2.5 mg bid    -Continue Zyprexa  10 mg twice daily for mood stabilization and psychotic thinking. -Continue Keppra 500 mg twice daily for seizure disorder -Continue Depakote sprinkles 500 mg twice daily for mood stabilization.  01-13-2025 Valproic acid level 77. -Continue propranolol ER 60 mg once daily for anxiety and tachycardia -Continue haldol to 5 mg bid -for mood instability and psychotic symptoms -DISCONTINUE ativan 0.25 mg bid, AFTER TONIGHT'S DOSE.  -iNCREASE lithium FROM CR 300 mg twice daily -> to CR 450 mg BID  - for bipolar disorder   CT head negative in July 2022             -- Metabolic profile and EKG monitoring obtained while on an atypical antipsychotic (BMI: Lipid Panel: HbgA1c: QTc:) 413                          3. Medical Issues Being Addressed:              Tobacco Use Disorder             -- Nicotine patch '21mg'$ /24 hours ordered             -- Smoking cessation encouraged              Vitamin D deficiency              -Continue vitamin D 50,000 Iunits weekly for 6 weeks.   4. Discharge Planning: Social work and case management to assist with discharge planning and  identification of hospital follow-up needs prior to discharge.  Per mom, pt cannot return home to live with her.    Christoper Allegra, MD 01/13/2022, 12:18 PM   Total Time Spent in Direct Patient Care:  I personally spent 30 minutes on the unit in direct patient care. The direct patient care time included face-to-face time with the patient, reviewing the patient's chart, communicating with other professionals, and coordinating care. Greater than 50% of this time was spent in counseling or coordinating care with the patient regarding goals of hospitalization, psycho-education, and discharge planning needs.    Janine Limbo, MD Psychiatrist

## 2022-01-15 NOTE — BHH Group Notes (Signed)
[facility] Group Notes:  (Nursing/MHT/Case Management/Adjunct) ? ?Date:  01/15/2022  ?Time:  10:44 AM ? ?Type of Therapy:   Orientation/Goals group ? ?Participation Level:  Active ? ?Participation Quality:  Appropriate ? ?Affect:  Appropriate ? ?Cognitive:  Appropriate ? ?Insight:  Appropriate ? ?Engagement in Group:  Engaged and Improving ? ?Modes of Intervention:  Discussion, Education, and Orientation ? ?Summary of Progress/Problems: Pt goal for today is to work on bettering herself and to live with integrity.  ? ?Misty Howell ?01/15/2022, 10:44 AM ?

## 2022-01-15 NOTE — Progress Notes (Signed)
?   01/15/22 0515  ?Sleep  ?Number of Hours 7.5  ? ? ?

## 2022-01-16 NOTE — Progress Notes (Signed)
?   01/16/22 0545  ?Sleep  ?Number of Hours 8.25  ? ? ?

## 2022-01-16 NOTE — Group Note (Signed)
Recreation Therapy Group Note ? ? ?Group Topic: Communication ?Group Date: 01/16/2022 ?Start Time: 0951 ?End Time: 1030 ?Facilitators: Victorino Sparrow, LRT,CTRS ?Location: [place] [facility] ? ? ?Goal Area(s) Addresses:  ?Patient will effectively listen to complete activity.  ?Patient will identify communication skills used to make activity successful.  ?Patient will identify how skills used during activity can be used to reach post d/c goals.  ?  ?Group Description: Geometric Drawings.  Three volunteers from the peer group will be shown an abstract picture with a particular arrangement of geometrical shapes.  Each round, one 'speaker' will describe the pattern, as accurately as possible without revealing the image to the group.  The remaining group members will listen and draw the picture to reflect how it is described to them. Patients with the role of 'listener' cannot ask clarifying questions but, may request that the speaker repeat a direction. Once the drawings are complete, the presenter will show the rest of the group the picture and compare how close each person came to drawing the picture. LRT will facilitate a post-activity discussion regarding effective communication and the importance of planning, listening, and asking for clarification in daily interactions with others. ? ? ?Affect/Mood: N/A ?  ?Participation Level: Did not attend ?  ? ?Clinical Observations/Individualized Feedback:   ?  ? ?Plan: Continue to engage patient in RT group sessions 2-3x/week. ? ? ?Victorino Sparrow, LRT,CTRS ?01/16/2022 11:33 AM ?

## 2022-01-16 NOTE — Progress Notes (Signed)
[facility] MD Progress Note  01/16/2022 3:36 PM Misty Howell  MRN:  [id number]  Subjective:    Patient is a 56 year old female with a psychiatric history of "anxiety I think that is all" who was admitted to this psychiatric unit from [facility] for evaluation psychotic symptoms and manic symptoms.   Yesterday, the psychiatry team made the following recommendations: -Continue Zyprexa  10 mg twice daily for mood stabilization and psychotic thinking. -Continue Keppra 500 mg twice daily for seizure disorder -Continue Depakote sprinkles 500 mg twice daily for mood stabilization.  01-13-2025 Valproic acid level 77. -Continue propranolol ER 60 mg once daily for anxiety and tachycardia -Continue haldol to 5 mg bid -for mood instability and psychotic symptoms -DISCONTINUE ativan 0.25 mg bid, AFTER TONIGHT'S DOSE.  -iNCREASE lithium FROM CR 300 mg twice daily -> to CR 450 mg BID  - for bipolar disorder  On my assessment today, the patient demonstrates more stability of her mood.  She is less tearful.  She is able to more clearly manipulate information.  Her thoughts are more organized and more linear.  She is able to start challenging some of the delusions.  No obvious paranoid thoughts.  Denies auditory hallucinations.  Anxiety is up and down throughout the day. Sleep is improved, 8.25 hours last night per nursing. Appetite is stable. Denies having suicidal thoughts.  Denies having homicidal thoughts.   Patient is able to rationally and logically discuss discharge planning, for the first time today.  She reports sister is helping her find housing.  She denies side effects to currently prescribed psychiatric medications.  She denies other physical complaints.   Principal Problem: Bipolar I disorder, current or most recent episode manic, severe ([place]) Diagnosis: Principal Problem:   Bipolar I disorder, current or most recent episode manic, severe ([facility]) Active Problems:   Acute psychosis ([place])   GAD  (generalized anxiety disorder)   PTSD (post-traumatic stress disorder)  Total Time spent with patient: 20 minutes  Past Psychiatric History: See H&P  Past Medical History:  Past Medical History:  Diagnosis Date   Anxiety    Cancer ([place])    Giant cell tumor left fifth finger.   Hypercholesterolemia    Overweight(278.02)    Seizures ([facility])    Syncope and collapse    Vision abnormalities    Vitamin D deficiency     Past Surgical History:  Procedure Laterality Date   CESAREAN SECTION     FINGER SURGERY     due to giant cell tumor, hip bone placed in finger   Family History:  Family History  Problem Relation Age of Onset   Hypertension Mother    Migraines Mother    Hypertension Father    Family Psychiatric  History: See H&P Social History:  Social History   Substance and Sexual Activity  Alcohol Use Not Currently   Comment: social     Social History   Substance and Sexual Activity  Drug Use Yes   Types: Marijuana   Comment: Pt stated she used Delta 8 a couple weeks ago    Social History   Socioeconomic History   Marital status: Legally Separated    Spouse name: Not on file   Number of children: Not on file   Years of education: Not on file   Highest education level: Not on file  Occupational History   Occupation: Finance  Tobacco Use   Smoking status: Former    Packs/day: 0.00    Years: 3.00  Pack years: 0.00    Types: Cigarettes    Quit date: 01/20/1989    Years since quitting: 33.0   Smokeless tobacco: Never  Vaping Use   Vaping Use: Never used  Substance and Sexual Activity   Alcohol use: Not Currently    Comment: social   Drug use: Yes    Types: Marijuana    Comment: Pt stated she used Delta 8 a couple weeks ago   Sexual activity: Not Currently  Other Topics Concern   Not on file  Social History Narrative   Pt lives in [place] with mother and two teenaged sons.  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  Not followed by an outpatient psych provider.    Social Determinants of Health   Financial Resource Strain: Not on file  Food Insecurity: Not on file  Transportation Needs: Not on file  Physical Activity: Not on file  Stress: Not on file  Social Connections: Not on file   Additional Social History:                         Sleep: Fair  Appetite:  Fair  Current Medications: Current Facility-Administered Medications  Medication Dose Route Frequency Provider Last Rate Last Admin   acetaminophen (TYLENOL) tablet 650 mg  650 mg Oral Q6H PRN Ethelene Hal, NP       alum & mag hydroxide-simeth (MAALOX/MYLANTA) 200-200-20 MG/5ML suspension 30 mL  30 mL Oral Q4H PRN Ethelene Hal, NP       divalproex (DEPAKOTE SPRINKLE) capsule 500 mg  500 mg Oral Q12H Zora Glendenning, Ovid Curd, MD   500 mg at 01/16/22 5638   haloperidol (HALDOL) tablet 5 mg  5 mg Oral BID Misty Limbo, MD   5 mg at 01/16/22 7564   hydrOXYzine (ATARAX) tablet 25 mg  25 mg Oral TID PRN Ethelene Hal, NP   25 mg at 01/10/22 0848   levETIRAcetam (KEPPRA) tablet 500 mg  500 mg Oral BID Misty Limbo, MD   500 mg at 01/16/22 3329   lithium carbonate (ESKALITH) CR tablet 450 mg  450 mg Oral Q12H Lian Pounds, Ovid Curd, MD   450 mg at 01/16/22 0837   OLANZapine zydis (ZYPREXA) disintegrating tablet 5 mg  5 mg Oral Q8H PRN Joshus Rogan, Ovid Curd, MD       And   LORazepam (ATIVAN) tablet 1 mg  1 mg Oral PRN Riann Oman, Ovid Curd, MD       And   ziprasidone (GEODON) injection 20 mg  20 mg Intramuscular PRN Ariea Rochin, Ovid Curd, MD       magnesium hydroxide (MILK OF MAGNESIA) suspension 30 mL  30 mL Oral Daily PRN Ethelene Hal, NP       OLANZapine zydis (ZYPREXA) disintegrating tablet 10 mg  10 mg Oral BID Haeleigh Streiff, Ovid Curd, MD   10 mg at 01/16/22 5188   propranolol ER (INDERAL LA) 24 hr capsule 60 mg  60 mg Oral Daily Piccola Arico, Ovid Curd, MD   60 mg at 01/16/22 0837   traZODone (DESYREL) tablet 50 mg  50 mg Oral QHS PRN Ethelene Hal, NP    50 mg at 01/15/22 2051   Vitamin D (Ergocalciferol) (DRISDOL) capsule 50,000 Units  50,000 Units Oral Q7 days [name], MD   50,000 Units at 01/15/22 1329    Lab Results:  Results for orders placed or performed during the hospital encounter of 01/06/22 (from the past 48 hour(s))  Lithium level     Status: Abnormal  Collection Time: 01/15/22  6:30 PM  Result Value Ref Range   Lithium Lvl 0.38 (L) 0.60 - 1.20 mmol/L    Comment: Performed at [facility], [place] [address], [place], [place] [address]     Blood Alcohol level:  Lab Results  Component Value Date   ETH <10 01/03/2022   ETH <10 60/45/4098    Metabolic Disorder Labs: Lab Results  Component Value Date   HGBA1C 5.6 01/07/2022   MPG 114.02 01/07/2022   MPG 108.28 08/08/2017   No results found for: PROLACTIN Lab Results  Component Value Date   CHOL 154 01/07/2022   TRIG 153 (H) 01/07/2022   HDL 49 01/07/2022   CHOLHDL 3.1 01/07/2022   VLDL 31 01/07/2022   LDLCALC 74 01/07/2022   LDLCALC 121 (H) 04/13/2009    Physical Findings: AIMS: Facial and Oral Movements Muscles of Facial Expression: None, normal Lips and Perioral Area: None, normal Jaw: None, normal Tongue: None, normal,Extremity Movements Upper (arms, wrists, hands, fingers): None, normal Lower (legs, knees, ankles, toes): None, normal, Trunk Movements Neck, shoulders, hips: None, normal, Overall Severity Severity of abnormal movements (highest score from questions above): None, normal Incapacitation due to abnormal movements: None, normal Patient's awareness of abnormal movements (rate only patient's report): No Awareness, Dental Status Current problems with teeth and/or dentures?: No Does patient usually wear dentures?: No  CIWA:    COWS:     Musculoskeletal: Strength & Muscle Tone: within normal limits Gait & Station: normal Patient leans: N/A  Psychiatric Specialty Exam:  Presentation  General Appearance:  Appropriately clothed and covered.  Not odorous.   Eye Contact:Good  Speech: Normal rate  Speech Volume:Normal  Handedness:Right   Mood and Affect  Mood: Euthymic, anxious  Affect:Full Range   Thought Process  Thought Processes:Goal Directed  Descriptions of Associations: Linear  Orientation:Full (Time, Place and Person)  Thought Content: Logical  History of Schizophrenia/Schizoaffective disorder:No  Duration of Psychotic Symptoms:Less than six months  Hallucinations:Hallucinations: denies AH, VH  Ideas of Reference: Denies paranoid thinking.  Suicidal Thoughts:No data recorded Denies having suicidal thoughts  Homicidal Thoughts:No data recorded Denies having homicidal thoughts  Sensorium  Memory:Immediate Fair; Recent Fair; Remote Fair  Judgment:Fair  Insight:improving   Executive Functions  Concentration: Improving  Attention Span: Improving  Recall: [place]  Language:Good   Psychomotor Activity  Psychomotor Activity:No data recorded Normal  Assets  Assets:Communication Skills; Resilience; Social Support   Sleep  Sleep:No data recorded Stable, 8.25 hours last night   Physical Exam: Physical Exam Vitals reviewed.  Constitutional:      General: She is not in acute distress.    Appearance: She is not toxic-appearing.  Pulmonary:     Effort: Pulmonary effort is normal.  Neurological:     Mental Status: She is alert.     Motor: No weakness.     Gait: Gait normal.    Review of Systems  Constitutional:  Negative for chills and fever.  Cardiovascular:  Negative for chest pain and palpitations.  Neurological:  Negative for dizziness, tingling, tremors and headaches.  Psychiatric/Behavioral:  Positive for depression, hallucinations and substance abuse. Negative for suicidal ideas. The patient is nervous/anxious. The patient does not have insomnia.    Blood pressure 104/84, pulse 94, temperature 97.7 F (36.5  C), temperature source Oral, resp. rate 16, height '5\' 4"'$  (1.626 m), weight 86.2 kg, SpO2 98 %. Body mass index is 32.61 kg/m.   Treatment Plan Summary: Patient is a 56 year old  female with a psychiatric history of "anxiety I think that is all" who was admitted to this psychiatric unit from [facility] for evaluation psychotic symptoms and manic symptoms.   Daily contact with patient to assess and evaluate symptoms and progress in treatment.  Assessment: -Bipolar 1 disorder, severe, current episode manic versus brief psychotic disorder versus substance-induced mood disorder versus substance-induced psychotic disorder -GAD -PTSD -Seizure disorder by history   Psychiatric Diagnoses and Treatment:  -Continue Zyprexa  10 mg twice daily for mood stabilization and psychotic thinking. -Continue Keppra 500 mg twice daily for seizure disorder -Continue Depakote sprinkles 500 mg twice daily for mood stabilization.  01-13-2025 Valproic acid level 77. -Continue propranolol ER 60 mg once daily for anxiety and tachycardia -Continue haldol to 5 mg bid -for mood instability and psychotic symptoms -Continue lithium CR 450 mg BID  - for bipolar disorder   CT head negative in July 2022             -- Metabolic profile and EKG monitoring obtained while on an atypical antipsychotic (BMI: Lipid Panel: HbgA1c: QTc:) 413                          3. Medical Issues Being Addressed:              Tobacco Use Disorder             -- Nicotine patch '21mg'$ /24 hours ordered             -- Smoking cessation encouraged Vitamin D deficiency              -Continue vitamin D 50,000 Iunits weekly for 6 weeks.   4. Discharge Planning: Social work and case management to assist with discharge planning and identification of hospital follow-up needs prior to discharge.  Per mom, pt cannot return home to live with her.    Christoper Allegra, MD 01/13/2022, 12:18 PM   Total Time Spent in Direct Patient Care:  I personally spent 30  minutes on the unit in direct patient care. The direct patient care time included face-to-face time with the patient, reviewing the patient's chart, communicating with other professionals, and coordinating care. Greater than 50% of this time was spent in counseling or coordinating care with the patient regarding goals of hospitalization, psycho-education, and discharge planning needs.    Misty Limbo, MD Psychiatrist

## 2022-01-16 NOTE — Progress Notes (Signed)
Pt was encouraged but didn't attend orientation/goals group. ?

## 2022-01-16 NOTE — BHH Counselor (Signed)
CSW provided this patient with homeless resources. ? ? ? ? ?Darletta Moll MSW, LCSW ?Clincal Social Worker  ?[facility]  ?

## 2022-01-16 NOTE — BHH Counselor (Signed)
Jasper spoke with Christina at the [place] found by patients sister. Per Margreta Journey their house is currently full. CSW will continue to seek alternative housing for this patient. ? ? ? ?Darletta Moll MSW, LCSW ?Clincal Social Worker  ?[facility]  ?

## 2022-01-16 NOTE — BHH Counselor (Signed)
CSW emailed referral to [facility] for possible shelter placement.  ? ? ? ? ?Darletta Moll MSW, LCSW ?Clincal Social Worker  ?[facility]  ?

## 2022-01-17 ENCOUNTER — Encounter (HOSPITAL_COMMUNITY): Payer: Self-pay

## 2022-01-17 MED ORDER — TRAZODONE HCL 50 MG PO TABS: 50.0000 mg | ORAL_TABLET | Freq: Every evening | ORAL | 0 refills | Status: DC | PRN

## 2022-01-17 MED ORDER — HALOPERIDOL 5 MG PO TABS: 5.0000 mg | ORAL_TABLET | Freq: Two times a day (BID) | ORAL | 0 refills | Status: DC

## 2022-01-17 MED ORDER — LEVETIRACETAM 500 MG PO TABS: 500.0000 mg | ORAL_TABLET | Freq: Two times a day (BID) | ORAL | 0 refills | Status: DC

## 2022-01-17 MED ORDER — LITHIUM CARBONATE ER 450 MG PO TBCR: 450.0000 mg | EXTENDED_RELEASE_TABLET | Freq: Two times a day (BID) | ORAL | 0 refills | Status: DC

## 2022-01-17 MED ORDER — OLANZAPINE 10 MG PO TBDP: 10.0000 mg | ORAL_TABLET | Freq: Two times a day (BID) | ORAL | 0 refills | Status: DC

## 2022-01-17 MED ORDER — VITAMIN D (ERGOCALCIFEROL) 1.25 MG (50000 UNIT) PO CAPS: 50000.0000 [IU] | ORAL_CAPSULE | ORAL | 0 refills | Status: DC

## 2022-01-17 MED ORDER — HYDROXYZINE HCL 25 MG PO TABS: 25.0000 mg | ORAL_TABLET | Freq: Three times a day (TID) | ORAL | 0 refills | Status: DC | PRN

## 2022-01-17 MED ORDER — PROPRANOLOL HCL ER 60 MG PO CP24: 60.0000 mg | ORAL_CAPSULE | Freq: Every day | ORAL | 0 refills | Status: DC

## 2022-01-17 MED ORDER — DIVALPROEX SODIUM 125 MG PO CSDR: 500.0000 mg | DELAYED_RELEASE_CAPSULE | Freq: Two times a day (BID) | ORAL | 0 refills | Status: DC

## 2022-01-17 NOTE — Progress Notes (Signed)
?  [facility] Adult Case Management Discharge Plan : ? ?Will you be returning to the same living situation after discharge:  No. Will be discharged to a shelter ?At discharge, do you have transportation home?: No. Taxi voucher will be used ?Do you have the ability to pay for your medications: No. Samples to be provided at discharge ? ?Release of information consent forms completed and in the chart;  Patient's signature needed at discharge. ? ?Patient to Follow up at: ? Follow-up Information   ? ? [facility]. Go on 01/24/2022.   ?Why: You have a hospital follow up appointment for therapy and medication management services on 01/24/22 at 1:00 pm.  This appointment will be held in person ?Contact information: ?[address] [place], [place] [address] ? ?Phone: [phone], [phone] ?Fax:  [phone] ? ?  ?  ? ? [facility]. Call.   ?Why: Please go to have labs completed on Monday 01/20/2022. ?Contact information: ?[address] ?[place], [place] [address] ? ?Phone: [phone] ? ?  ?  ? ?  ?  ? ?  ? ? ?Next level of care provider has access to [facility] ? ?Safety Planning and Suicide Prevention discussed: Yes,  with mother and sister ? ?  ? ?Has patient been referred to the Quitline?: N/A patient is not a smoker ? ?Patient has been referred for addiction treatment: N/A ? ?Vassie Moselle, LCSW ?01/17/2022, 9:48 AM ?

## 2022-01-17 NOTE — Progress Notes (Signed)
D: Pt A & O X 3. Denies SI, HI, AVH and pain at this time. D/C home as ordered. Picked up in front of facility by taxi cab [facility]).  ?A: D/C instructions reviewed with pt including prescriptions, medication samples and follow up appointment, compliance encouraged. All belongings from assigned locker returned to pt at time of departure. Scheduled medications given with verbal education and effects monitored. Safety checks maintained without incident till time of d/c.  ?R: Pt receptive to care. Compliant with medications when offered. Denies adverse drug reactions when assessed. Verbalized understanding related to d/c instructions. Signed belonging sheet in agreement with items received from locker. Ambulatory with a steady gait. Appears to be in no physical distress at time of departure.  ? ?

## 2022-01-17 NOTE — Progress Notes (Signed)
Recreation Therapy Notes ? ?INPATIENT RECREATION TR PLAN ? ?Patient Details ?Name: Misty Howell ?MRN: [id number] ?DOB: [birth year] ?Today's Date: 01/17/2022 ? ?Rec Therapy Plan ?Is patient appropriate for Therapeutic Recreation?: Yes ?Treatment times per week: about 3 days ?Estimated Length of Stay: 5-7 days ?TR Treatment/Interventions: Group participation (Comment) ? ?Discharge Criteria ?Pt will be discharged from therapy if:: Discharged ?Treatment plan/goals/alternatives discussed and agreed upon by:: Patient/family ? ?Discharge Summary ?Short term goals set: See patient care plan ?Short term goals met: Adequate for discharge ?Progress toward goals comments: Groups attended ?Which groups?: Wellness, Coping skills, Leisure education, Other (Comment) (Decision Making; Team Building) ?Reason goals not met: Slow to come around ?Therapeutic equipment acquired: N/A ?Reason patient discharged from therapy: Discharge from hospital ?Pt/family agrees with progress & goals achieved: Yes ?Date patient discharged from therapy: 01/17/22 ? ? ?Victorino Sparrow, LRT,CTRS ?Victorino Sparrow A ?01/17/2022, 11:28 AM ?

## 2022-01-17 NOTE — Progress Notes (Signed)
?   01/17/22 0545  ?Sleep  ?Number of Hours 8.25  ? ? ?

## 2022-01-17 NOTE — BH IP Treatment Plan (Signed)
Interdisciplinary Treatment and Diagnostic Plan Update  01/17/2022 Time of Session: Did not attend- update Misty Howell MRN: [id number]  Principal Diagnosis: Bipolar I disorder, current or most recent episode manic, severe ([place])  Secondary Diagnoses: Principal Problem:   Bipolar I disorder, current or most recent episode manic, severe ([facility]) Active Problems:   Acute psychosis ([place])   GAD (generalized anxiety disorder)   PTSD (post-traumatic stress disorder)   Current Medications:  Current Facility-Administered Medications  Medication Dose Route Frequency Provider Last Rate Last Admin   acetaminophen (TYLENOL) tablet 650 mg  650 mg Oral Q6H PRN Ethelene Hal, NP       alum & mag hydroxide-simeth (MAALOX/MYLANTA) 200-200-20 MG/5ML suspension 30 mL  30 mL Oral Q4H PRN Ethelene Hal, NP       divalproex (DEPAKOTE SPRINKLE) capsule 500 mg  500 mg Oral Q12H Massengill, Ovid Curd, MD   500 mg at 01/17/22 0998   haloperidol (HALDOL) tablet 5 mg  5 mg Oral BID Janine Limbo, MD   5 mg at 01/17/22 3382   hydrOXYzine (ATARAX) tablet 25 mg  25 mg Oral TID PRN Ethelene Hal, NP   25 mg at 01/10/22 0848   levETIRAcetam (KEPPRA) tablet 500 mg  500 mg Oral BID Massengill, Ovid Curd, MD   500 mg at 01/17/22 5053   lithium carbonate (ESKALITH) CR tablet 450 mg  450 mg Oral Q12H Massengill, Ovid Curd, MD   450 mg at 01/17/22 0835   OLANZapine zydis (ZYPREXA) disintegrating tablet 5 mg  5 mg Oral Q8H PRN Massengill, Ovid Curd, MD       And   LORazepam (ATIVAN) tablet 1 mg  1 mg Oral PRN Massengill, Ovid Curd, MD       And   ziprasidone (GEODON) injection 20 mg  20 mg Intramuscular PRN Massengill, Ovid Curd, MD       magnesium hydroxide (MILK OF MAGNESIA) suspension 30 mL  30 mL Oral Daily PRN Ethelene Hal, NP       OLANZapine zydis (ZYPREXA) disintegrating tablet 10 mg  10 mg Oral BID Massengill, Ovid Curd, MD   10 mg at 01/17/22 0835   propranolol ER (INDERAL LA) 24 hr capsule 60 mg   60 mg Oral Daily Massengill, Ovid Curd, MD   60 mg at 01/17/22 0835   traZODone (DESYREL) tablet 50 mg  50 mg Oral QHS PRN Ethelene Hal, NP   50 mg at 01/16/22 2105   Vitamin D (Ergocalciferol) (DRISDOL) capsule 50,000 Units  50,000 Units Oral Q7 days [name], MD   50,000 Units at 01/15/22 1329   PTA Medications: Medications Prior to Admission  Medication Sig Dispense Refill Last Dose   Eszopiclone 3 MG TABS Take 1 tablet (3 mg total) by mouth at bedtime as needed. Take immediately before bedtime (Patient not taking: Reported on 01/04/2022) 10 tablet 0    HYDROcodone-acetaminophen (NORCO/VICODIN) 5-325 MG tablet Take 1 tablet by mouth every 6 (six) hours as needed for severe pain. (Patient not taking: Reported on 01/04/2022) 10 tablet 0    hydrOXYzine (ATARAX) 25 MG tablet Take 1 tablet (25 mg total) by mouth every 6 (six) hours as needed for anxiety. (Patient not taking: Reported on 01/04/2022) 30 tablet 0    ibuprofen (ADVIL) 600 MG tablet Take 1 tablet (600 mg total) by mouth every 6 (six) hours as needed. (Patient not taking: Reported on 01/04/2022) 30 tablet 0    lidocaine (LIDODERM) 5 % Place 1 patch onto the skin daily. Remove & Discard patch  within 12 hours or as directed by MD (Patient not taking: Reported on 01/04/2022) 30 patch 0    methocarbamol (ROBAXIN) 750 MG tablet Take 1 tablet (750 mg total) by mouth 2 (two) times daily as needed for muscle spasms (or muscle tightness). (Patient not taking: Reported on 01/04/2022) 20 tablet 0    nitrofurantoin, macrocrystal-monohydrate, (MACROBID) 100 MG capsule Take 1 capsule (100 mg total) by mouth 2 (two) times daily. (Patient not taking: Reported on 01/04/2022) 10 capsule 0    ondansetron (ZOFRAN ODT) 4 MG disintegrating tablet Take 1 tablet (4 mg total) by mouth every 8 (eight) hours as needed for nausea or vomiting. (Patient not taking: Reported on 01/04/2022) 20 tablet 0    [DISCONTINUED] levETIRAcetam (KEPPRA) 500 MG tablet Take 1 tablet  (500 mg total) by mouth 2 (two) times daily. (Patient not taking: Reported on 01/04/2022) 60 tablet 1     Patient Stressors: Other: none stated by pt    Patient Strengths: Supportive family/friends   Treatment Modalities: Medication Management, Group therapy, Case management,  1 to 1 session with clinician, Psychoeducation, Recreational therapy.   Physician Treatment Plan for Primary Diagnosis: Bipolar I disorder, current or most recent episode manic, severe ([place]) Long Term Goal(s): Improvement in symptoms so as ready for discharge   Short Term Goals: Ability to identify changes in lifestyle to reduce recurrence of condition will improve Ability to verbalize feelings will improve Ability to demonstrate self-control will improve Ability to identify and develop effective coping behaviors will improve Ability to maintain clinical measurements within normal limits will improve Compliance with prescribed medications will improve Ability to identify triggers associated with substance abuse/mental health issues will improve  Medication Management: Evaluate patient's response, side effects, and tolerance of medication regimen.  Therapeutic Interventions: 1 to 1 sessions, Unit Group sessions and Medication administration.  Evaluation of Outcomes: Met  Physician Treatment Plan for Secondary Diagnosis: Principal Problem:   Bipolar I disorder, current or most recent episode manic, severe ([place]) Active Problems:   Acute psychosis ([place])   GAD (generalized anxiety disorder)   PTSD (post-traumatic stress disorder)  Long Term Goal(s): Improvement in symptoms so as ready for discharge   Short Term Goals: Ability to identify changes in lifestyle to reduce recurrence of condition will improve Ability to verbalize feelings will improve Ability to demonstrate self-control will improve Ability to identify and develop effective coping behaviors will improve Ability to maintain clinical measurements  within normal limits will improve Compliance with prescribed medications will improve Ability to identify triggers associated with substance abuse/mental health issues will improve     Medication Management: Evaluate patient's response, side effects, and tolerance of medication regimen.  Therapeutic Interventions: 1 to 1 sessions, Unit Group sessions and Medication administration.  Evaluation of Outcomes: Met   RN Treatment Plan for Primary Diagnosis: Bipolar I disorder, current or most recent episode manic, severe ([place]) Long Term Goal(s): Knowledge of disease and therapeutic regimen to maintain health will improve  Short Term Goals: Ability to remain free from injury will improve, Ability to verbalize frustration and anger appropriately will improve, Ability to demonstrate self-control, Ability to participate in decision making will improve, Ability to verbalize feelings will improve, Ability to disclose and discuss suicidal ideas, Ability to identify and develop effective coping behaviors will improve, and Compliance with prescribed medications will improve  Medication Management: RN will administer medications as ordered by provider, will assess and evaluate patient's response and provide education to patient for prescribed medication. RN will report any  adverse and/or side effects to prescribing provider.  Therapeutic Interventions: 1 on 1 counseling sessions, Psychoeducation, Medication administration, Evaluate responses to treatment, Monitor vital signs and CBGs as ordered, Perform/monitor CIWA, COWS, AIMS and Fall Risk screenings as ordered, Perform wound care treatments as ordered.  Evaluation of Outcomes: Met   LCSW Treatment Plan for Primary Diagnosis: Bipolar I disorder, current or most recent episode manic, severe ([place]) Long Term Goal(s): Safe transition to appropriate next level of care at discharge, Engage patient in therapeutic group addressing interpersonal concerns.  Short  Term Goals: Engage patient in aftercare planning with referrals and resources, Increase social support, Increase ability to appropriately verbalize feelings, Increase emotional regulation, Facilitate acceptance of mental health diagnosis and concerns, Facilitate patient progression through stages of change regarding substance use diagnoses and concerns, Identify triggers associated with mental health/substance abuse issues, and Increase skills for wellness and recovery  Therapeutic Interventions: Assess for all discharge needs, 1 to 1 time with Social worker, Explore available resources and support systems, Assess for adequacy in community support network, Educate family and significant other(s) on suicide prevention, Complete Psychosocial Assessment, Interpersonal group therapy.  Evaluation of Outcomes: Met   Progress in Treatment: Attending groups: Yes. Participating in groups: Yes. Taking medication as prescribed: Yes. Toleration medication: Yes. Family/Significant other contact made: Yes, individual(s) contacted:  [name] Patient understands diagnosis: Yes. Discussing patient identified problems/goals with staff: Yes. Medical problems stabilized or resolved: Yes. Denies suicidal/homicidal ideation: Yes. Issues/concerns per patient self-inventory: Yes.  New problem(s) identified: No, Describe:  none reported  New Short Term/Long Term Goal(s):  medication stabilization, elimination of SI thoughts, development of comprehensive mental wellness plan.    Patient Goals:  "to be a better person"   Discharge Plan or Barriers:  Patient discharging to shelter, [facility].  After care follow up provided including therapy, med management and primary care to follow up on labs.   Reason for Continuation of Hospitalization: Other; describe patient will discharge today  Estimated Length of Stay: 0 days   Scribe for Treatment Team: Zachery Conch, LCSW 01/17/2022 10:06 AM

## 2022-01-17 NOTE — Discharge Summary (Signed)
Physician Discharge Summary Note  Patient:  Misty Howell is an 56 y.o., female MRN:  [id number] DOB:  08-30-1966 Patient phone:  [phone] (home)  Patient address:   [address] [place] [address],  Total Time spent with patient: 20 minutes  Date of Admission:  01/06/2022 Date of Discharge: 01-17-2022  Reason for Admission:    Patient is a 56 year old female with a psychiatric history of "anxiety I think that is all" who was admitted to this psychiatric unit from [facility] for evaluation psychotic symptoms and manic and manic symptoms.  Principal Problem: Bipolar I disorder, current or most recent episode manic, severe ([place]) Discharge Diagnoses: Principal Problem:   Bipolar I disorder, current or most recent episode manic, severe ([place]) Active Problems:   Acute psychosis ([place])   GAD (generalized anxiety disorder)   PTSD (post-traumatic stress disorder)   Past Psychiatric History:  Previous psychiatric diagnoses: Anxiety Previous psychiatric hospitalization: Couple of years ago, for anxiety, in [place] Prior to admission psychiatric medication: Denies.  Reports last taking psychiatric medication about 2 years ago, [facility] Psychiatric medication history: Patient is unsure, reports being prescribed psychiatric medication in the past History of suicide attempt: Denies  Past Medical History:  Past Medical History:  Diagnosis Date   Anxiety    Cancer ([place])    Giant cell tumor left fifth finger.   Hypercholesterolemia    Overweight(278.02)    Seizures ([facility])    Syncope and collapse    Vision abnormalities    Vitamin D deficiency     Past Surgical History:  Procedure Laterality Date   CESAREAN SECTION     FINGER SURGERY     due to giant cell tumor, hip bone placed in finger   Family History:  Family History  Problem Relation Age of Onset   Hypertension Mother    Migraines Mother    Hypertension Father    Family Psychiatric  History:   Psychiatric family history: Denies.  Family history of suicide attempt: Denies.  Social History:  Social History   Substance and Sexual Activity  Alcohol Use Not Currently   Comment: social     Social History   Substance and Sexual Activity  Drug Use Yes   Types: Marijuana   Comment: Pt stated she used Delta 8 a couple weeks ago    Social History   Socioeconomic History   Marital status: Legally Separated    Spouse name: Not on file   Number of children: Not on file   Years of education: Not on file   Highest education level: Not on file  Occupational History   Occupation: Finance  Tobacco Use   Smoking status: Former    Packs/day: 0.00    Years: 3.00    Pack years: 0.00    Types: Cigarettes    Quit date: 01/20/1989    Years since quitting: 33.0   Smokeless tobacco: Never  Vaping Use   Vaping Use: Never used  Substance and Sexual Activity   Alcohol use: Not Currently    Comment: social   Drug use: Yes    Types: Marijuana    Comment: Pt stated she used Delta 8 a couple weeks ago   Sexual activity: Not Currently  Other Topics Concern   Not on file  Social History Narrative   Pt lives in [place] with mother and two teenaged sons.  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  Not followed by an outpatient psych provider.   Social Determinants of  Health   Financial Resource Strain: Not on file  Food Insecurity: Not on file  Transportation Needs: Not on file  Physical Activity: Not on file  Stress: Not on file  Social Connections: Not on file    Hospital Course:    HOSPITAL COURSE:  During the patient's hospitalization, patient had extensive initial psychiatric evaluation, and follow-up psychiatric evaluations every day.  Psychiatric diagnoses provided upon initial assessment:  -Bipolar 1 disorder, severe, current episode manic versus brief psychotic disorder versus substance-induced mood disorder versus substance-induced psychotic disorder -GAD -PTSD -Seizure  disorder by history  Patient's psychiatric medications were adjusted on admission:  -Increase Zyprexa from 10 mg once daily at bedtime to 10 mg twice daily for mood stabilization and psychotic thinking -Start Keppra 500 mg twice daily for seizure disorder -Start Depakote DR 500 mg twice daily for mood stabilization -Start propranolol 10 mg twice daily for anxiety and tachycardia  During the hospitalization, other adjustments were made to the patient's psychiatric medication regimen:  -zyprexa was continued at 10 mg bid -haldol was started and increased to 5 mg bid  -depakote sprinkles 500 mg bid was continued -ativan was started, and tapered off before discharge -lithium was started and increased to CR 450 mg bid  -propranolol was increased to ER 60 mg once daily   Patient's care was discussed during the interdisciplinary team meeting every day during the hospitalization.  The patient denied having side effects to prescribed psychiatric medication.  Gradually, patient started adjusting to milieu. The patient was evaluated each day by a clinical provider to ascertain response to treatment. Improvement was noted by the patient's report of decreasing symptoms, improved sleep and appetite, affect, medication tolerance, behavior, and participation in unit programming.  Patient was asked each day to complete a self inventory noting mood, mental status, pain, new symptoms, anxiety and concerns.   Symptoms were reported as significantly decreased or resolved completely by discharge.  The patient reports that their mood is stable.  The patient denied having suicidal thoughts for more than 48 hours prior to discharge.  Patient denies having homicidal thoughts.  Patient denies having auditory hallucinations.  Patient denies any visual hallucinations or other symptoms of psychosis.  The patient was motivated to continue taking medication with a goal of continued improvement in mental health.   The  patient reports their target psychiatric symptoms of mania, paranoia, AH, and delusions, all responded well to the psychiatric medications, and the patient reports overall benefit other psychiatric hospitalization. Supportive psychotherapy was provided to the patient. The patient also participated in regular group therapy while hospitalized. Coping skills, problem solving as well as relaxation therapies were also part of the unit programming.  Labs were reviewed with the patient, and abnormal results were discussed with the patient.  The patient is able to verbalize their individual safety plan to this provider.  # It is recommended to the patient to continue psychiatric medications as prescribed, after discharge from the hospital.    # It is recommended to the patient to follow up with your outpatient psychiatric provider and PCP.  # It was discussed with the patient, the impact of alcohol, drugs, tobacco have been there overall psychiatric and medical wellbeing, and total abstinence from substance use was recommended the patient.ed.  # Prescriptions provided or sent directly to preferred pharmacy at discharge. Patient agreeable to plan. Given opportunity to ask questions. Appears to feel comfortable with discharge.    # In the event of worsening symptoms, the patient is  instructed to call the crisis hotline, 911 and or go to the nearest ED for appropriate evaluation and treatment of symptoms. To follow-up with primary care provider for other medical issues, concerns and or health care needs  # Patient was discharged shelter (see below) with a plan to follow up as noted below.   Physical Findings: AIMS: Facial and Oral Movements Muscles of Facial Expression: None, normal Lips and Perioral Area: None, normal Jaw: None, normal Tongue: None, normal,Extremity Movements Upper (arms, wrists, hands, fingers): None, normal Lower (legs, knees, ankles, toes): None, normal, Trunk Movements Neck,  shoulders, hips: None, normal, Overall Severity Severity of abnormal movements (highest score from questions above): None, normal Incapacitation due to abnormal movements: None, normal Patient's awareness of abnormal movements (rate only patient's report): No Awareness, Dental Status Current problems with teeth and/or dentures?: No Does patient usually wear dentures?: No  CIWA:    COWS:     AIMS score is zero on day of discharge.  No EPS appreciated on physical exam, on day of discharge.   Musculoskeletal: Strength & Muscle Tone: within normal limits Gait & Station: normal Patient leans: N/A   Psychiatric Specialty Exam:  Presentation  General Appearance: Appropriate for Environment; Casual; Fairly Groomed  Eye Contact:Good  Speech:Normal Rate  Speech Volume:Normal  Handedness:Right   Mood and Affect  Mood:Euthymic  Affect:Appropriate; Congruent; Full Range   Thought Process  Thought Processes:Linear  Descriptions of Associations:Intact  Orientation:Full (Time, Place and Person)  Thought Content:Logical  History of Schizophrenia/Schizoaffective disorder:No  Duration of Psychotic Symptoms:Less than six months  Hallucinations:Hallucinations: None  Ideas of Reference:None  Suicidal Thoughts:Suicidal Thoughts: No  Homicidal Thoughts:Homicidal Thoughts: No   Sensorium  Memory:Immediate Good; Recent Good; Remote Good  Judgment:Fair  Insight:Fair   Executive Functions  Concentration:Fair  Attention Span:Fair  Recall:Poor  Fund of Knowledge:Fair  Language:Good   Psychomotor Activity  Psychomotor Activity:Psychomotor Activity: Normal   Assets  Assets:Communication Skills; Resilience; Social Support   Sleep  Sleep:Sleep: Good    Physical Exam: Physical Exam Vitals reviewed.  Constitutional:      General: She is not in acute distress.    Appearance: She is not toxic-appearing.  Pulmonary:     Effort: Pulmonary effort is normal.   Neurological:     Mental Status: She is alert.     Motor: No weakness.     Gait: Gait normal.  Psychiatric:        Mood and Affect: Mood normal.        Behavior: Behavior normal.        Thought Content: Thought content normal.        Judgment: Judgment normal.   Review of Systems  Constitutional:  Negative for chills and fever.  Cardiovascular:  Negative for chest pain and palpitations.  Neurological:  Negative for dizziness, tingling, tremors and headaches.  Psychiatric/Behavioral:  Negative for depression, hallucinations, memory loss, substance abuse and suicidal ideas. The patient is not nervous/anxious and does not have insomnia.   All other systems reviewed and are negative.  Blood pressure 116/81, pulse 93, temperature 97.7 F (36.5 C), temperature source Oral, resp. rate 16, height '5\' 4"'$  (1.626 m), weight 86.2 kg, SpO2 99 %. Body mass index is 32.61 kg/m.   Social History   Tobacco Use  Smoking Status Former   Packs/day: 0.00   Years: 3.00   Pack years: 0.00   Types: Cigarettes   Quit date: 01/20/1989   Years since quitting: 33.0  Smokeless Tobacco Never   Tobacco  Cessation:  N/A, patient does not currently use tobacco products   Blood Alcohol level:  Lab Results  Component Value Date   ETH <10 01/03/2022   ETH <10 41/93/7902    Metabolic Disorder Labs:  Lab Results  Component Value Date   HGBA1C 5.6 01/07/2022   MPG 114.02 01/07/2022   MPG 108.28 08/08/2017   No results found for: PROLACTIN Lab Results  Component Value Date   CHOL 154 01/07/2022   TRIG 153 (H) 01/07/2022   HDL 49 01/07/2022   CHOLHDL 3.1 01/07/2022   VLDL 31 01/07/2022   LDLCALC 74 01/07/2022   LDLCALC 121 (H) 04/13/2009    See Psychiatric Specialty Exam and Suicide Risk Assessment completed by Attending Physician prior to discharge.  Discharge destination:  Other:  shelter  Is patient on multiple antipsychotic therapies at discharge:  Yes,   Do you recommend tapering to  monotherapy for antipsychotics?  Yes   Has Patient had three or more failed trials of antipsychotic monotherapy by history:  No Patient's symptoms did not respond to multiple doses of zyprexa and depakote, therefore haldol was added. Lithium had to be added as well to achieve mood stabilization.   Recommended Plan for Multiple Antipsychotic Therapies: Taper to monotherapy as described:  recommend tapering to monotherapy when patient has had continued psychiatric stability for 3-6 months  Discharge Instructions     Diet - low sodium heart healthy   Complete by: As directed    Increase activity slowly   Complete by: As directed       Allergies as of 01/17/2022   No Known Allergies      Medication List     STOP taking these medications    Eszopiclone 3 MG Tabs   HYDROcodone-acetaminophen 5-325 MG tablet Commonly known as: NORCO/VICODIN   ibuprofen 600 MG tablet Commonly known as: ADVIL   methocarbamol 750 MG tablet Commonly known as: ROBAXIN   nitrofurantoin (macrocrystal-monohydrate) 100 MG capsule Commonly known as: MACROBID   ondansetron 4 MG disintegrating tablet Commonly known as: Zofran ODT       TAKE these medications      Indication  divalproex 125 MG capsule Commonly known as: DEPAKOTE SPRINKLE Take 4 capsules (500 mg total) by mouth every 12 (twelve) hours.  Indication: Manic-Depression   haloperidol 5 MG tablet Commonly known as: HALDOL Take 1 tablet (5 mg total) by mouth 2 (two) times daily.  Indication: Manic Phase of Manic-Depression   hydrOXYzine 25 MG tablet Commonly known as: ATARAX Take 1 tablet (25 mg total) by mouth 3 (three) times daily as needed for anxiety. What changed: when to take this  Indication: Feeling Anxious   levETIRAcetam 500 MG tablet Commonly known as: Keppra Take 1 tablet (500 mg total) by mouth 2 (two) times daily.  Indication: Seizure   lidocaine 5 % Commonly known as: Lidoderm Place 1 patch onto the skin  daily. Remove & Discard patch within 12 hours or as directed by MD  Indication: pain   lithium carbonate 450 MG CR tablet Commonly known as: ESKALITH Take 1 tablet (450 mg total) by mouth every 12 (twelve) hours.  Indication: Manic-Depression   OLANZapine zydis 10 MG disintegrating tablet Commonly known as: ZYPREXA Take 1 tablet (10 mg total) by mouth 2 (two) times daily.  Indication: Manic Phase of Manic-Depression, psychosis   propranolol ER 60 MG 24 hr capsule Commonly known as: INDERAL LA Take 1 capsule (60 mg total) by mouth daily. Start taking on: January 18, 2022  Indication: Feeling Anxious   traZODone 50 MG tablet Commonly known as: DESYREL Take 1 tablet (50 mg total) by mouth at bedtime as needed for sleep.  Indication: Trouble Sleeping   Vitamin D (Ergocalciferol) 1.25 MG (50000 UNIT) Caps capsule Commonly known as: DRISDOL Take 1 capsule (50,000 Units total) by mouth every 7 (seven) days. Start taking on: January 22, 2022  Indication: Vitamin D Deficiency        Follow-up Information     Services, [facility]. Go on 01/24/2022.   Why: You have a hospital follow up appointment for therapy and medication management services on 01/24/22 at 9:00 am.     This appointment will be held in person. Contact information: [place] [address] [phone]         [facility] Follow up on 01/24/2022.   Why: You have a hospital follow up appointment for therapy and medication management services on 01/24/22 at 1:00 pm.  in person Contact information: [address], [place], [place] [address]  Phone: [phone], [phone] Fax:  [phone]                Follow-up recommendations:   -Follow-up with your outpatient psychiatric provider -instructions on appointment date, time, and address (location) are provided to you in discharge paperwork.   -Take your psychiatric medications as prescribed at discharge  - instructions are provided to you in the discharge paperwork   -Follow-up with outpatient primary care doctor and other specialists -for management of chronic medical disease, including: seizure disorder.    You will need to have a lithium level lab taken on Monday 01-20-2022 - please have this lab done in the morning. On the morning of this lab work, please do not take your lithium tablet, until after you have had the lab work completed. Please have the lab results sent to your primary care doctor and your psychiatrist.    -Recommend abstinence from alcohol, tobacco, and other illicit drug use at discharge.    -If your psychiatric symptoms recur, worsen, or if you have side effects to your psychiatric medications, call your outpatient psychiatric provider, 911, [phone] or go to the nearest emergency department.   -If suicidal thoughts occur, call your outpatient psychiatric provider, 911, [phone] or go to the nearest emergency department.    Signed: Christoper Allegra, MD 01/17/2022, 9:44 AM    Total Time Spent in Direct Patient Care:  I personally spent 45 minutes on the unit in direct patient care. The direct patient care time included face-to-face time with the patient, reviewing the patient's chart, communicating with other professionals, and coordinating care. Greater than 50% of this time was spent in counseling or coordinating care with the patient regarding goals of hospitalization, psycho-education, and discharge planning needs.   Janine Limbo, MD Psychiatrist

## 2022-01-17 NOTE — Plan of Care (Signed)
Patient showed improvement in participating in groups in a calm mood and appropriate response at completion of recreation therapy group sessions. ? ? ?Victorino Sparrow, LRT,CTRS ?

## 2022-01-17 NOTE — Progress Notes (Signed)
?   01/17/22 0030  ?Psych Admission Type (Psych Patients Only)  ?Admission Status Involuntary  ?Psychosocial Assessment  ?Patient Complaints Anxiety  ?Eye Contact Fair  ?Facial Expression Anxious  ?Affect Appropriate to circumstance  ?Speech Logical/coherent  ?Interaction Assertive  ?Motor Activity Slow  ?Appearance/Hygiene Unremarkable  ?Behavior Characteristics Cooperative  ?Mood Anxious  ?Aggressive Behavior  ?Effect No apparent injury  ?Thought Process  ?Coherency Circumstantial  ?Content WDL  ?Delusions WDL  ?Perception WDL  ?Hallucination None reported or observed  ?Judgment Impaired  ?Confusion None  ?Danger to Self  ?Current suicidal ideation? Denies  ?Danger to Others  ?Danger to Others None reported or observed  ?Danger to Others Abnormal  ?Harmful Behavior to others No threats or harm toward other people  ?Destructive Behavior No threats or harm toward property  ? ? ?

## 2022-01-17 NOTE — Discharge Instructions (Signed)
Please take medications as prescribed in this document. ? ?You will need to have a lithium level lab taken on Monday 01-20-2022 - please have this lab done in the morning. On the morning of this lab work, please do not take your lithium tablet, until after you have had the lab work completed. Please have the lab results sent to your primary care doctor and your psychiatrist.  ? ? ?

## 2022-01-17 NOTE — Group Note (Signed)
Recreation Therapy Group Note ? ? ?Group Topic:Team Building  ?Group Date: 01/17/2022 ?Start Time: 1000 ?End Time: 5956 ?Facilitators: Victorino Sparrow, LRT,CTRS ?Location: [facility] ? ? ?Goal Area(s) Addresses:  ?Patient will effectively work with peer towards shared goal.  ?Patient will identify skills used to make activity successful.  ?Patient will identify how skills used during activity can be used to reach post d/c goals.  ?  ?Group Description:  Straw Bridge. In teams of 3-5, patients were given 15 plastic drinking straws and an equal length of masking tape. Using the materials provided, patients were instructed to build a free standing bridge-like structure to suspend an everyday item (ex: puzzle box) off of the floor or table surface. All materials were required to be used by the team in their design. LRT facilitated post-activity discussion reviewing team process. Patients were encouraged to reflect how the skills used in this activity can be generalized to daily life post discharge.  ? ? ?Affect/Mood: N/A ?  ?Participation Level: Did not attend ?  ? ?Clinical Observations/Individualized Feedback:   ?  ? ?Plan: Continue to engage patient in RT group sessions 2-3x/week. ? ? ?Victorino Sparrow, LRT,CTRS ?01/17/2022 11:19 AM ?
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
## 2022-02-18 ENCOUNTER — Emergency Department (HOSPITAL_COMMUNITY)
Admission: EM | Admit: 2022-02-18 | Discharge: 2022-02-20 | Disposition: A | Discharge status: Other Acute Inpatient Hospital | Payer: Medicaid Other | Attending: Student | Admitting: Student

## 2022-02-18 ENCOUNTER — Encounter (HOSPITAL_COMMUNITY): Payer: Self-pay | Admitting: Emergency Medicine

## 2022-02-18 DIAGNOSIS — Z79899 Other long term (current) drug therapy: Secondary | ICD-10-CM | POA: Insufficient documentation

## 2022-02-18 DIAGNOSIS — F4310 Post-traumatic stress disorder, unspecified: Secondary | ICD-10-CM | POA: Diagnosis present

## 2022-02-18 DIAGNOSIS — Z85828 Personal history of other malignant neoplasm of skin: Secondary | ICD-10-CM | POA: Insufficient documentation

## 2022-02-18 DIAGNOSIS — F310 Bipolar disorder, current episode hypomanic: Secondary | ICD-10-CM | POA: Diagnosis present

## 2022-02-18 DIAGNOSIS — F3131 Bipolar disorder, current episode depressed, mild: Secondary | ICD-10-CM | POA: Insufficient documentation

## 2022-02-18 DIAGNOSIS — Z20822 Contact with and (suspected) exposure to covid-19: Secondary | ICD-10-CM | POA: Insufficient documentation

## 2022-02-18 DIAGNOSIS — R4689 Other symptoms and signs involving appearance and behavior: Secondary | ICD-10-CM

## 2022-02-18 DIAGNOSIS — F411 Generalized anxiety disorder: Secondary | ICD-10-CM | POA: Diagnosis present

## 2022-02-18 DIAGNOSIS — Z87891 Personal history of nicotine dependence: Secondary | ICD-10-CM | POA: Insufficient documentation

## 2022-02-18 LAB — CBC WITH DIFFERENTIAL/PLATELET
Abs Immature Granulocytes: 0.03 10*3/uL (ref 0.00–0.07)
Basophils Absolute: 0 10*3/uL (ref 0.0–0.1)
Basophils Relative: 0 %
Eosinophils Absolute: 0.1 10*3/uL (ref 0.0–0.5)
Eosinophils Relative: 1 %
HCT: 43.2 % (ref 36.0–46.0)
Hemoglobin: 14.8 g/dL (ref 12.0–15.0)
Immature Granulocytes: 0 %
Lymphocytes Relative: 28 %
Lymphs Abs: 2.3 10*3/uL (ref 0.7–4.0)
MCH: 30.5 pg (ref 26.0–34.0)
MCHC: 34.3 g/dL (ref 30.0–36.0)
MCV: 89.1 fL (ref 80.0–100.0)
Monocytes Absolute: 0.6 10*3/uL (ref 0.1–1.0)
Monocytes Relative: 7 %
Neutro Abs: 5.1 10*3/uL (ref 1.7–7.7)
Neutrophils Relative %: 64 %
Platelets: 278 10*3/uL (ref 150–400)
RBC: 4.85 MIL/uL (ref 3.87–5.11)
RDW: 12.5 % (ref 11.5–15.5)
WBC: 8.1 10*3/uL (ref 4.0–10.5)
nRBC: 0 % (ref 0.0–0.2)

## 2022-02-18 LAB — ETHANOL: Alcohol, Ethyl (B): <10 mg/dL (ref <10)

## 2022-02-18 LAB — ACETAMINOPHEN LEVEL: Acetaminophen (Tylenol), Serum: <10 ug/mL — ABNORMAL LOW (ref 10–30)

## 2022-02-18 LAB — COMPREHENSIVE METABOLIC PANEL
ALT: 18 U/L (ref 0–44)
AST: 19 U/L (ref 15–41)
Albumin: 4.4 g/dL (ref 3.5–5.0)
Alkaline Phosphatase: 83 U/L (ref 38–126)
Anion gap: 9 (ref 5–15)
BUN: 6 mg/dL (ref 6–20)
CO2: 26 mmol/L (ref 22–32)
Calcium: 9.5 mg/dL (ref 8.9–10.3)
Chloride: 104 mmol/L (ref 98–111)
Creatinine, Ser: 0.69 mg/dL (ref 0.44–1.00)
GFR, Estimated: >60 mL/min (ref >60)
Glucose, Bld: 142 mg/dL — ABNORMAL HIGH (ref 70–99)
Potassium: 3.1 mmol/L — ABNORMAL LOW (ref 3.5–5.1)
Sodium: 139 mmol/L (ref 135–145)
Total Bilirubin: 0.4 mg/dL (ref 0.3–1.2)
Total Protein: 7.9 g/dL (ref 6.5–8.1)

## 2022-02-18 LAB — SALICYLATE LEVEL: Salicylate Lvl: <7 mg/dL — ABNORMAL LOW (ref 7.0–30.0)

## 2022-02-18 LAB — RESP PANEL BY RT-PCR (FLU A&B, COVID) ARPGX2
Influenza A by PCR: NEGATIVE
Influenza B by PCR: NEGATIVE
SARS Coronavirus 2 by RT PCR: NEGATIVE

## 2022-02-18 LAB — RAPID URINE DRUG SCREEN, HOSP PERFORMED
Amphetamines: NOT DETECTED
Barbiturates: NOT DETECTED
Benzodiazepines: NOT DETECTED
Cocaine: NOT DETECTED
Opiates: NOT DETECTED
Tetrahydrocannabinol: NOT DETECTED

## 2022-02-18 LAB — VALPROIC ACID LEVEL: Valproic Acid Lvl: <10 ug/mL — ABNORMAL LOW (ref 50.0–100.0)

## 2022-02-18 MED ORDER — OLANZAPINE 5 MG PO TBDP
10.0000 mg | ORAL_TABLET | Freq: Two times a day (BID) | ORAL | Status: DC
  Administered 2022-02-18 – 2022-02-20 (×4): 10 mg via ORAL
  Filled 2022-02-18 (×5): qty 2

## 2022-02-18 MED ORDER — DIVALPROEX SODIUM 125 MG PO CSDR
500.0000 mg | DELAYED_RELEASE_CAPSULE | Freq: Two times a day (BID) | ORAL | Status: DC
  Administered 2022-02-18 – 2022-02-20 (×4): 500 mg via ORAL
  Filled 2022-02-18 (×4): qty 4

## 2022-02-18 MED ORDER — LEVETIRACETAM 500 MG PO TABS
500.0000 mg | ORAL_TABLET | Freq: Two times a day (BID) | ORAL | Status: DC
  Administered 2022-02-18 – 2022-02-20 (×5): 500 mg via ORAL
  Filled 2022-02-18 (×5): qty 1
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
## 2022-02-18 NOTE — ED Notes (Signed)
Patient out to use phone ?

## 2022-02-18 NOTE — BH Assessment (Addendum)
@  1301, followed up with patient's nurse (Crystal, RN) to see if patient is available to be seen. Per Crystal, RN patient is under the care of another nurse Blima Singer, RN). Clinician reached out to that nurse and requested the TTS machine to be set up for patient's initial TTS assessment.  ? ?

## 2022-02-18 NOTE — ED Triage Notes (Signed)
Patient here with complaint of feeling stressed and upset. Denies substance abuse. PAtient is oriented to self and location. Denies SI, HI, and AVH. Patient recently discharged from [facility] approximately one month ago. ?

## 2022-02-18 NOTE — ED Notes (Signed)
Patient on TTS 
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
## 2022-02-18 NOTE — BH Assessment (Addendum)
@  1230, requested patient's nurse (Crystal) to place the TTS machine in patient's room for her initial TTS assessment.  ?

## 2022-02-18 NOTE — Progress Notes (Signed)
Chaplain delivered Bible to nurse who conveyed it to patient, who requested it. ? ?Rev. [place] Ethel Veronica ?Pager [phone] ?

## 2022-02-18 NOTE — ED Notes (Signed)
Patient making second phone call ?

## 2022-02-18 NOTE — ED Provider Triage Note (Signed)
Emergency Medicine Provider Triage Evaluation Note ? ?Misty [facility]ell , a 56 y.o. female  was evaluated in triage.  Pt complains of insomnia and anxiety.  Patient reports that she has been having increased stress recently.  Patient does endorse new recent stressors but does not elaborate on them. ? ?Patient's mother at bedside reports that patient has not been taking any of her prescribed psychiatric medications since being discharged from inpatient psych facility approximately 1 month prior.  States that patient has not been sleeping well at night.  Patient has been having episodes of crying. ? ?Patient denies any SI, HI, AVH, illicit drug use, or alcohol use. ? ?Review of Systems  ?Positive: Anxiety, insomnia ?Negative: SI, HI, AVH ? ?Physical Exam  ?BP (!) 153/120 (BP Location: Left Arm)   Pulse (!) 105   Temp 98.9 ?F (37.2 ?C) (Oral)   Resp 14   SpO2 99%  ?Gen:   Awake, no distress   ?Resp:  Normal effort  ?MSK:   Moves extremities without difficulty  ?Other:  Patient appears withdrawn and has flat affect. ? ?Medical Decision Making  ?Medically screening exam initiated at 9:26 AM.  Appropriate orders placed.  TESA MEADORS was informed that the remainder of the evaluation will be completed by another provider, this initial triage assessment does not replace that evaluation, and the importance of remaining in the ED until their evaluation is complete. ? ?Patient does not have any SI, HI, AVH.  Does not appear to be acutely psychotic or manic at this time.  Does not meet criteria for IVC.  Will place medical clearance labs at this time. ?  ?Loni Beckwith, PA-C ?02/18/22 9147 ? ?

## 2022-02-18 NOTE — ED Provider Notes (Signed)
?[facility] ?Provider Note ? ?CSN: [id number] ?Arrival date & time: 02/18/22 0901 ? ?Chief Complaint(s) ?Medical Clearance ? ?HPI ?Misty Howell is a 56 y.o. female with PMH bipolar disorder, anxiety, rosacea, seizure disorder who presents emergency department for evaluation of abnormal behavior.  On initial evaluation of the patient, she gives me differing reasons as to what brought her to the emergency department stating that she "is just scared".  She apparently has not been taking any of her psychiatric medications.  She states she has not slept in 3 days.  Additional history obtained from mother who states that the patient has had angry outbursts and staring spells out the window.  She states that she looks like how she looks prior to her last psychiatric admission.  The patient denies chest pain, shortness of breath, abdominal pain, nausea, vomiting or other systemic symptoms. ? ?HPI ? ?Past Medical History ?Past Medical History:  ?Diagnosis Date  ? Anxiety   ? Cancer [facility])   ? Giant cell tumor left fifth finger.  ? Hypercholesterolemia   ? Overweight(278.02)   ? Seizures ([place])   ? Syncope and collapse   ? Vision abnormalities   ? Vitamin D deficiency   ? ?Patient Active Problem List  ? Diagnosis Date Noted  ? Bipolar I disorder, current or most recent episode manic, severe ([place]) 01/07/2022  ? GAD (generalized anxiety disorder) 01/07/2022  ? PTSD (post-traumatic stress disorder) 01/07/2022  ? Brief psychotic disorder ([place]) 01/06/2022  ? Acute psychosis ([place]) 01/06/2022  ? Hyperglycemia 08/08/2017  ? Hypokalemia 08/07/2017  ? Seizure ([place]) 11/24/2013  ? Collapse 11/24/2013  ? Vitamin D deficiency 07/06/2010  ? Hyperlipidemia 07/05/2010  ? OVERWEIGHT 03/31/2008  ? Anxiety state 03/30/2008  ? ?Home Medication(s) ?Prior to Admission medications   ?Medication Sig Start Date End Date Taking? Authorizing Provider  ?divalproex (DEPAKOTE SPRINKLE) 125 MG capsule Take 4 capsules  (500 mg total) by mouth every 12 (twelve) hours. 01/17/22   Massengill, Ovid Curd, MD  ?haloperidol (HALDOL) 5 MG tablet Take 1 tablet (5 mg total) by mouth 2 (two) times daily. 01/17/22   Massengill, Ovid Curd, MD  ?hydrOXYzine (ATARAX) 25 MG tablet Take 1 tablet (25 mg total) by mouth 3 (three) times daily as needed for anxiety. 01/17/22   Massengill, Ovid Curd, MD  ?levETIRAcetam (KEPPRA) 500 MG tablet Take 1 tablet (500 mg total) by mouth 2 (two) times daily. 01/17/22   Massengill, Ovid Curd, MD  ?lidocaine (LIDODERM) 5 % Place 1 patch onto the skin daily. Remove & Discard patch within 12 hours or as directed by MD ?Patient not taking: Reported on 01/04/2022 10/01/20   Arlean Hopping C, PA-C  ?lithium carbonate (ESKALITH) 450 MG CR tablet Take 1 tablet (450 mg total) by mouth every 12 (twelve) hours. 01/17/22   Massengill, Ovid Curd, MD  ?OLANZapine zydis (ZYPREXA) 10 MG disintegrating tablet Take 1 tablet (10 mg total) by mouth 2 (two) times daily. 01/17/22   Massengill, Ovid Curd, MD  ?propranolol ER (INDERAL LA) 60 MG 24 hr capsule Take 1 capsule (60 mg total) by mouth daily. 01/18/22   Massengill, Ovid Curd, MD  ?traZODone (DESYREL) 50 MG tablet Take 1 tablet (50 mg total) by mouth at bedtime as needed for sleep. 01/17/22   Massengill, Ovid Curd, MD  ?Vitamin D, Ergocalciferol, (DRISDOL) 1.25 MG (50000 UNIT) CAPS capsule Take 1 capsule (50,000 Units total) by mouth every 7 (seven) days. 01/22/22   Janine Limbo, MD  ?                                                                                                                                  ?  Past Surgical History ?Past Surgical History:  ?Procedure Laterality Date  ? CESAREAN SECTION    ? FINGER SURGERY    ? due to giant cell tumor, hip bone placed in finger  ? ?Family History ?Family History  ?Problem Relation Age of Onset  ? Hypertension Mother   ? Migraines Mother   ? Hypertension Father   ? ? ?Social History ?Social History  ? ?Tobacco Use  ? Smoking status: Former  ?  Packs/day:  0.00  ?  Years: 3.00  ?  Pack years: 0.00  ?  Types: Cigarettes  ?  Quit date: 01/20/1989  ?  Years since quitting: 33.1  ? Smokeless tobacco: Never  ?Vaping Use  ? Vaping Use: Never used  ?Substance Use Topics  ? Alcohol use: Not Currently  ?  Comment: social  ? Drug use: Yes  ?  Types: Marijuana  ?  Comment: Pt stated she used Delta 8 a couple weeks ago  ? ?Allergies ?Patient has no known allergies. ? ?Review of Systems ?Review of Systems  ?Psychiatric/Behavioral:  Positive for behavioral problems and sleep disturbance.   ? ?Physical Exam ?Vital Signs  ?I have reviewed the triage vital signs ?BP (!) 134/94 (BP Location: Left Arm)   Pulse 89   Temp 98.9 ?F (37.2 ?C) (Oral)   Resp 14   SpO2 95%  ? ?Physical Exam ?Vitals and nursing note reviewed.  ?Constitutional:   ?   General: She is not in acute distress. ?   Appearance: She is well-developed.  ?HENT:  ?   Head: Normocephalic and atraumatic.  ?   Comments: Bilateral facial erythema consistent with patient's rosacea ?Eyes:  ?   Conjunctiva/sclera: Conjunctivae normal.  ?Cardiovascular:  ?   Rate and Rhythm: Normal rate and regular rhythm.  ?   Heart sounds: No murmur heard. ?Pulmonary:  ?   Effort: Pulmonary effort is normal. No respiratory distress.  ?   Breath sounds: Normal breath sounds.  ?Abdominal:  ?   Palpations: Abdomen is soft.  ?   Tenderness: There is no abdominal tenderness.  ?Musculoskeletal:     ?   General: No swelling.  ?   Cervical back: Neck supple.  ?Skin: ?   General: Skin is warm and dry.  ?   Capillary Refill: Capillary refill takes less than 2 seconds.  ?Neurological:  ?   Mental Status: She is alert.  ? ? ?ED Results and Treatments ?Labs ?(all labs ordered are listed, but only abnormal results are displayed) ?Labs Reviewed  ?COMPREHENSIVE METABOLIC PANEL - Abnormal; Notable for the following components:  ?    Result Value  ? Potassium 3.1 (*)   ? Glucose, Bld 142 (*)   ? All other components within normal limits  ?SALICYLATE LEVEL -  Abnormal; Notable for the following components:  ? Salicylate Lvl <1.9 (*)   ? All other components within normal limits  ?ACETAMINOPHEN LEVEL - Abnormal; Notable for the following components:  ? Acetaminophen (Tylenol), Serum <10 (*)   ? All other components within normal limits  ?RESP PANEL BY RT-PCR (FLU A&B, COVID) ARPGX2  ?ETHANOL  ?CBC WITH DIFFERENTIAL/PLATELET  ?RAPID URINE DRUG SCREEN, HOSP PERFORMED  ?I-STAT BETA HCG BLOOD, ED ([facility], [facility], [facility] ONLY)  ?                                                                                                                       ? ?  Radiology ?No results found. ? ?Pertinent labs & imaging results that were available during my care of the patient were reviewed by me and considered in my medical decision making (see MDM for details). ? ?Medications Ordered in ED ?Medications - No data to display                                                               ?                                                                    ?Procedures ?Procedures ? ?(including critical care time) ? ?Medical Decision Making / ED Course ? ? ?This patient presents to the ED for concern of abnormal behavior, this involves an extensive number of treatment options, and is a complaint that carries with it a high risk of complications and morbidity.  The differential diagnosis includes psychosis, mania, catatonia, electrolyte abnormality, infection ? ?MDM: ?Patient seen emergency department for evaluation of abnormal behavior.  Physical exam with a red complexion to bilateral cheeks consistent with the patient's rosacea but is otherwise unremarkable.  Laboratory evaluation unremarkable outside of some mild hypokalemia.  Home medications restarted.  Patient medically clear for psychiatric evaluation.  TTS evaluate the patient recommending inpatient psychiatric admission.  Psychiatric facility pending. ? ? ?Additional history obtained: ?-Additional history obtained from mother ?-External records from  outside source obtained and reviewed including: Chart review including previous notes, labs, imaging, consultation notes ? ? ?Lab Tests: ?-I ordered, reviewed, and interpreted labs.   ?The pertinent results in

## 2022-02-18 NOTE — Progress Notes (Signed)
Patient has been faxed out due to no appropriate beds at [facility]. Patient meets [facility] inpatient criteria per Earleen Newport, NP. Patient has been faxed out to the following facilities:  ? ? ?[facility]  [address] [place] [address] [phone]  ?[facility]  [address] [place] [address] [phone]  ?[place]  [address], [place] [address] [phone]  ?[place] [place] [address] [phone]  ?[place] [address] [place] [address] [phone]  ?[facility]  [place], [place] [address] [phone]  ?[facility]  [address], [place] [address] [phone]  ?[facility]  [address] [place] [address] [phone]  ?[place] [address] [place] [place] [address] [phone]  ?[facility]  [address], [place] [address] [phone]  ?[facility]  [address] [place] [address] [phone]  ?[facility]  [address] [place] [address] [phone]  ? ?Mariea Clonts, MSW, LCSW-A  ?7:55 PM 02/18/2022   ?

## 2022-02-18 NOTE — ED Notes (Signed)
Patient has finally gotten in the bed to lie down. ?

## 2022-02-19 ENCOUNTER — Encounter (HOSPITAL_COMMUNITY): Payer: Self-pay | Admitting: Registered Nurse

## 2022-02-19 NOTE — ED Notes (Signed)
Pt requested her lunch tray. Informed pt that it is not lunch time yet but that she can have a snack. Pt provided crackers and a soft drink.  ?

## 2022-02-19 NOTE — ED Notes (Signed)
TTS in process 

## 2022-02-19 NOTE — ED Notes (Signed)
Pt speaking with TTS 

## 2022-02-19 NOTE — Consult Note (Signed)
Telepsych Consultation  ? ?Reason for Consult:  Paranoia ?Referring Physician:  Teressa Lower, MD ?Location of Patient: [facility] ED ?Location of Provider: Other: [facility] ? ?Patient Identification: Misty Howell ?MRN:  [id number] ?Principal Diagnosis: Bipolar I disorder, current or most recent episode hypomanic ([name]) ?Diagnosis:  Principal Problem: ?  Bipolar I disorder, current or most recent episode hypomanic ([name]) ?Active Problems: ?  GAD (generalized anxiety disorder) ?  PTSD (post-traumatic stress disorder) ? ? ?Total Time spent with patient: 30 minutes ? ?Subjective:   ?Misty Howell is a 56 y.o. female patient admitted to [facility] ED after presenting via her mother with complaints ?I've been upset, I just need to talk to a counselor? and ?Talk about the things that happened to me when I was a little girl?.  Reported no sleep more than 2-3 hours in a couple of days.  Also, paranoia not wanting to disclose things because it will be exposed to the Internet. Also, feels that she is being monitored and watched.. ? ?HPI:  Misty Howell, 56 y.o., female patient seen via tele health by this provider, consulted with Dr. Hampton Abbot; and chart reviewed on 02/19/22.  On evaluation Misty Howell reports she is feeling better this morning since she has had some sleep.  She denies suicidal/self-harm/homicidal ideation, psychosis, and paranoia but states yesterday she felt she couldn't talk because didn't want anyone overhearing some of her private things that she needed to say.  States that she is always paranoid related to [place], knowing a lot of people "not sure what to discuss privately with doctor or nurse."  Reporting she has had 2 prior psychiatric hospitalizations but felt uncomfortable related to paranoia.  Also states she hasn't taken any of her medications since her last hospitalization "I was at [facility] last and I discarded all my medicine cause I didn't know which to take.  State last  hospitalization was 2-3 weeks ago.  Possibility that medication changes were made from the time patient was discharged from [facility] to another facility at some point and patient didn't know which medications she was suppose to take so she threw them all away.  She currently doesn't have outpatient psychiatric services.   ?During evaluation Misty Howell is sitting up in chair at foot of bed in no acute distress.  She is alert, oriented x 4, calm, cooperative and her mood is anxious with congruent affect.  She does present with some paranoia but it doesn't appear that she responding to internal/external stimuli, or delusional thoughts.  She denies suicidal/self-harm/homicidal ideation, and psychosis.  Doesn't feel she is back to herself and needs to be on medications to get stable enough to be at home.  Reports she lives with her mother.  ? ? ?Past Psychiatric History: See below ? ?Risk to Self:  Denies ?Risk to Others:  Denies ?Prior Inpatient Therapy:  Yes ?Prior Outpatient Therapy:  Yes, but no outpatient psychiatric services at this time ? ?Past Medical History:  ?Past Medical History:  ?Diagnosis Date  ? Anxiety   ? Cancer [facility])   ? Giant cell tumor left fifth finger.  ? Hypercholesterolemia   ? Overweight(278.02)   ? Seizures ([place])   ? Syncope and collapse   ? Vision abnormalities   ? Vitamin D deficiency   ?  ?Past Surgical History:  ?Procedure Laterality Date  ? CESAREAN SECTION    ? FINGER SURGERY    ? due to giant cell tumor,  hip bone placed in finger  ? ?Family History:  ?Family History  ?Problem Relation Age of Onset  ? Hypertension Mother   ? Migraines Mother   ? Hypertension Father   ? ?Family Psychiatric  History: None reported ?Social History:  ?Social History  ? ?Substance and Sexual Activity  ?Alcohol Use Not Currently  ? Comment: social  ?   ?Social History  ? ?Substance and Sexual Activity  ?Drug Use Yes  ? Types: Marijuana  ? Comment: Pt stated she used Delta 8 a couple weeks ago  ?  ?Social  History  ? ?Socioeconomic History  ? Marital status: Legally Separated  ?  Spouse name: Not on file  ? Number of children: Not on file  ? Years of education: Not on file  ? Highest education level: Not on file  ?Occupational History  ? Occupation: Finance  ?Tobacco Use  ? Smoking status: Former  ?  Packs/day: 0.00  ?  Years: 3.00  ?  Pack years: 0.00  ?  Types: Cigarettes  ?  Quit date: 01/20/1989  ?  Years since quitting: 33.1  ? Smokeless tobacco: Never  ?Vaping Use  ? Vaping Use: Never used  ?Substance and Sexual Activity  ? Alcohol use: Not Currently  ?  Comment: social  ? Drug use: Yes  ?  Types: Marijuana  ?  Comment: Pt stated she used Delta 8 a couple weeks ago  ? Sexual activity: Not Currently  ?Other Topics Concern  ? Not on file  ?Social History Narrative  ? Pt lives in [place] with mother and two teenaged sons.  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  Not followed by an outpatient psych provider.  ? ?Social Determinants of Health  ? ?Financial Resource Strain: Not on file  ?Food Insecurity: Not on file  ?Transportation Needs: Not on file  ?Physical Activity: Not on file  ?Stress: Not on file  ?Social Connections: Not on file  ? ?Additional Social History: ?  ? ?Allergies:  No Known Allergies ? ?Labs:  ?Results for orders placed or performed during the hospital encounter of 02/18/22 (from the past 48 hour(s))  ?Resp Panel by RT-PCR (Flu A&B, Covid) Nasopharyngeal Swab     Status: None  ? Collection Time: 02/18/22  9:27 AM  ? Specimen: Nasopharyngeal Swab; Nasopharyngeal(NP) swabs in vial transport medium  ?Result Value Ref Range  ? SARS Coronavirus 2 by RT PCR NEGATIVE NEGATIVE  ?  Comment: (NOTE) ?SARS-CoV-2 target nucleic acids are NOT DETECTED. ? ?The SARS-CoV-2 RNA is generally detectable in upper respiratory ?specimens during the acute phase of infection. The lowest ?concentration of SARS-CoV-2 viral copies this assay can detect is ?138 copies/mL. A negative result does not preclude SARS-Cov-2 ?infection and  should not be used as the sole basis for treatment or ?other patient management decisions. A negative result may occur with  ?improper specimen collection/handling, submission of specimen other ?than nasopharyngeal swab, presence of viral mutation(s) within the ?areas targeted by this assay, and inadequate number of viral ?copies(<138 copies/mL). A negative result must be combined with ?clinical observations, patient history, and epidemiological ?information. The expected result is Negative. ? ?Fact Sheet for Patients:  ?EntrepreneurPulse.com.au ? ?Fact Sheet for Healthcare Providers:  ?IncredibleEmployment.be ? ?This test is no t yet approved or cleared by the [place] FDA and  ?has been authorized for detection and/or diagnosis of SARS-CoV-2 by ?FDA under an Emergency Use Authorization (EUA). This EUA will remain  ?in effect (meaning this test can be used)  for the duration of the ?COVID-19 declaration under Section 564(b)(1) of the Act, 21 ?U.S.C.section 360bbb-3(b)(1), unless the authorization is terminated  ?or revoked sooner.  ? ? ?  ? Influenza A by PCR NEGATIVE NEGATIVE  ? Influenza B by PCR NEGATIVE NEGATIVE  ?  Comment: (NOTE) ?The Xpert Xpress SARS-CoV-2/FLU/RSV plus assay is intended as an aid ?in the diagnosis of influenza from Nasopharyngeal swab specimens and ?should not be used as a sole basis for treatment. Nasal washings and ?aspirates are unacceptable for Xpert Xpress SARS-CoV-2/FLU/RSV ?testing. ? ?Fact Sheet for Patients: ?EntrepreneurPulse.com.au ? ?Fact Sheet for Healthcare Providers: ?IncredibleEmployment.be ? ?This test is not yet approved or cleared by the [place] FDA and ?has been authorized for detection and/or diagnosis of SARS-CoV-2 by ?FDA under an Emergency Use Authorization (EUA). This EUA will remain ?in effect (meaning this test can be used) for the duration of the ?COVID-19 declaration under Section  564(b)(1) of the Act, 21 U.S.C. ?section 360bbb-3(b)(1), unless the authorization is terminated or ?revoked. ? ?Performed at [facility], [place] [address], [place], [place] ?[address] ?  ?Comprehensive metabol

## 2022-02-19 NOTE — ED Provider Notes (Signed)
10:50 PM-the patient was discharged, and attempting to be transferred to [facility] for psychiatric admission, when she instructed the driver to turn around and bring her back to the ED.  On arrival back to the ED, she was now psychotic, stating she was scared and afraid she was going to be raped. ? ?Earlier today the patient took her clothes off because they were "dirty".  She was exposing herself in the hallway, naked. ? ?Patient was admitted to the ED yesterday, with statements of feeling scared, sleeplessness, periods of anger.  She reported that she has had problems like this requiring hospitalization in psychiatric facilities. ? ?Patient has history of bipolar disorder. ? ?Psychiatric admission arranged, but she refused safe transport.  At this time patient is alert and conversant.  She states that she was scared, to explain why she did not want to ride with them earlier.  She states now that she wants to call her sister, to see if it is okay to go to the facility by safe transport ? ?11:35 PM-plan repeat TTS to evaluate for current psychiatric status ?  ?Daleen Bo, MD ?02/19/22 2338 ? ?

## 2022-02-19 NOTE — BH Assessment (Addendum)
Patient is to be admitted to [facility] tonight 02/19/22 after 8:00pm by Dr. Weber Cooks.  ?Attending Physician will be Dr.  Weber Cooks .   ?Patient has been assigned to room 303, by [place], Arlyss Repress.  ? ?** Call report to: [phone] **  ? ?ER staff is aware of the admission: ?  ?Seth Bake, Patient Access.  ?

## 2022-02-19 NOTE — ED Notes (Signed)
Mother Misty Howell [phone] would like an update immediately ?

## 2022-02-19 NOTE — ED Notes (Signed)
Patient at desk making phone call to mother-Monique,RN  ?

## 2022-02-19 NOTE — ED Notes (Addendum)
Pt came to doorway of room and requested that staff "give her new clothes to put on but only her own clothes because the hospital clothes are dirty" and that staff "gave them to her dirty" pt educated that she cannot have her personal belongings but that she can be provided a new set of clean hospital provided scrubs. Pt refused claiming that all the hospital scrubs are dirty and went back into room before coming to the doorway again 30 minutes later repeating herself. Staff reassured her that no one gave her dirty/soiled scrubs.  ?

## 2022-02-19 NOTE — Progress Notes (Signed)
[facility] LCSW Progress Note ?  ?02/19/2022    3:19 PM ? ?Misty Howell  ? ?[id number]  ? ?Type of Contact and Topic:  Psychiatric Bed Placement  ? ?Pt accepted to [facility] 303   ? ?Patient meets inpatient criteria per Earleen Newport, NP   ? ?The attending provider will be Dr. Weber Cooks ? ?Call report to [phone]  ? ?Gweneth Dimitri, RN @ [facility] ED notified.    ? ?Pt scheduled  to arrive at [place].  ? ?Mariea Clonts, MSW, LCSW-A  ?3:20 PM 02/19/2022   ?  ? ?  ?  ? ? ? ? ?  ?

## 2022-02-19 NOTE — ED Provider Notes (Signed)
Emergency Medicine Observation Re-evaluation Note ? ?Misty Howell is a 56 y.o. female, seen on rounds today.  Pt initially presented to the ED for complaints of Medical Clearance and Psychiatric Evaluation ?Currently, the patient is sleeping. ? ?Physical Exam  ?BP 94/61 (BP Location: Right Arm)   Pulse 78   Temp 97.8 ?F (36.6 ?C) (Oral)   Resp 14   SpO2 100%  ?Physical Exam ?General: sleeping ?Cardiac: regular rate ?Lungs: clear ?Psych: paranoid, intermittently agitated ? ?ED Course / MDM  ?EKG:  ? ?I have reviewed the labs performed to date as well as medications administered while in observation.  Recent changes in the last 24 hours include in the night pt feeling like people were putting naked pictures of her on line and she was walking around without a shirt but redirectable. ? ?Plan  ?Current plan is for inpt treatment. ? Misty Howell is not under involuntary commitment. ? ? ?  ?Blanchie Dessert, MD ?02/19/22 [phone] ? ?

## 2022-02-19 NOTE — ED Notes (Signed)
Patient was placed in safe transport car and had driver to come back to EDP stating she left a letter in the bible; Nurse brought the letter out to the car; Patient was anxious and demanding the driver to call EMS for her; RN attempted to calm patient; Patient states "I'm scared I'm going to be raped"; RN offered for sitter to ride with her but patient declined; pt states she wants to go with Police; RN brought patient back to room and reversed discharged and notified charge RN, Catalina [place], [place], [place], and EDP. Patient is pending IVC for transfer to [facility]. Patient is tearful and continuous to state she is scared. Sitter remains at bedside at this time. RN is waiting for disp from EDP and/or [place] at this time-Monique,RN  ?

## 2022-02-19 NOTE — ED Notes (Signed)
ED Provider at bedside. 

## 2022-02-20 ENCOUNTER — Encounter: Payer: Self-pay | Admitting: Psychiatry

## 2022-02-20 ENCOUNTER — Other Ambulatory Visit: Payer: Self-pay

## 2022-02-20 ENCOUNTER — Inpatient Hospital Stay
Admission: AD | Admit: 2022-02-20 | Discharge: 2022-02-28 | DRG: 885 | Disposition: A | Discharge status: Home / Self Care | Payer: 59 | Source: Other Acute Inpatient Hospital | Attending: Psychiatry | Admitting: Psychiatry

## 2022-02-20 DIAGNOSIS — E559 Vitamin D deficiency, unspecified: Secondary | ICD-10-CM | POA: Diagnosis present

## 2022-02-20 DIAGNOSIS — G40909 Epilepsy, unspecified, not intractable, without status epilepticus: Secondary | ICD-10-CM | POA: Diagnosis present

## 2022-02-20 DIAGNOSIS — Z8249 Family history of ischemic heart disease and other diseases of the circulatory system: Secondary | ICD-10-CM | POA: Diagnosis not present

## 2022-02-20 DIAGNOSIS — Z6832 Body mass index (BMI) 32.0-32.9, adult: Secondary | ICD-10-CM | POA: Diagnosis not present

## 2022-02-20 DIAGNOSIS — Z87891 Personal history of nicotine dependence: Secondary | ICD-10-CM | POA: Diagnosis not present

## 2022-02-20 DIAGNOSIS — Z91199 Patient's noncompliance with other medical treatment and regimen due to unspecified reason: Secondary | ICD-10-CM

## 2022-02-20 DIAGNOSIS — F3163 Bipolar disorder, current episode mixed, severe, without psychotic features: Secondary | ICD-10-CM | POA: Diagnosis present

## 2022-02-20 DIAGNOSIS — E663 Overweight: Secondary | ICD-10-CM | POA: Diagnosis present

## 2022-02-20 MED ORDER — ACETAMINOPHEN 325 MG PO TABS: 650.0000 mg | ORAL_TABLET | Freq: Four times a day (QID) | ORAL | Status: DC | PRN

## 2022-02-20 MED ORDER — HYDROXYZINE HCL 50 MG PO TABS
50.0000 mg | ORAL_TABLET | Freq: Three times a day (TID) | ORAL | Status: DC | PRN
  Administered 2022-02-20 – 2022-02-22 (×3): 50 mg via ORAL
  Filled 2022-02-20 (×3): qty 1

## 2022-02-20 MED ORDER — LEVETIRACETAM 500 MG PO TABS
500.0000 mg | ORAL_TABLET | Freq: Two times a day (BID) | ORAL | Status: DC
  Administered 2022-02-20 – 2022-02-28 (×16): 500 mg via ORAL
  Filled 2022-02-20 (×17): qty 1

## 2022-02-20 MED ORDER — OLANZAPINE 10 MG PO TBDP
10.0000 mg | ORAL_TABLET | Freq: Two times a day (BID) | ORAL | Status: DC
  Administered 2022-02-20 – 2022-02-21 (×2): 10 mg via ORAL
  Filled 2022-02-20 (×2): qty 1

## 2022-02-20 MED ORDER — DIVALPROEX SODIUM 125 MG PO CSDR
500.0000 mg | DELAYED_RELEASE_CAPSULE | Freq: Two times a day (BID) | ORAL | Status: DC
  Administered 2022-02-20 – 2022-02-21 (×2): 500 mg via ORAL
  Filled 2022-02-20 (×2): qty 4

## 2022-02-20 MED ORDER — TEMAZEPAM 15 MG PO CAPS
15.0000 mg | ORAL_CAPSULE | Freq: Every evening | ORAL | Status: DC | PRN
  Administered 2022-02-20 – 2022-02-27 (×5): 15 mg via ORAL
  Filled 2022-02-20 (×5): qty 1

## 2022-02-20 MED ORDER — ALUM & MAG HYDROXIDE-SIMETH 200-200-20 MG/5ML PO SUSP: 30.0000 mL | ORAL | Status: DC | PRN

## 2022-02-20 MED ORDER — MAGNESIUM HYDROXIDE 400 MG/5ML PO SUSP
30.0000 mL | Freq: Every day | ORAL | Status: DC | PRN
  Administered 2022-02-23: 30 mL via ORAL
  Filled 2022-02-20 (×2): qty 30

## 2022-02-20 NOTE — ED Notes (Signed)
TC to [facility] to give report. Staff verified  receiving IVC paper work on PT. Charge nurse to call this Probation officer back . ?

## 2022-02-20 NOTE — Progress Notes (Signed)
Admission Note: ?  ?Report was received from [place], [place] on a 56 year old female who presents IVC in no acute distress for the treatment of Paranoia, noncompliance with medication and Depression. Patient appears flat and depressed. Patient was tearful, but calm and cooperative with admission process. Patient endorsed both depression and anxiety upon admission, rating them both a "10/10", stating that she lost her son five years ago and has been divorced for 12 years and has been bullied by him ever since then. Patient denies SI/HIAVH and pain at this time. Patient's goal for treatment is grief counseling and to get set up with a Psychiatrist and outpatient services. Patient has a past medical history of Anxiety, Bipolar and Seizure disorder. Skin was assessed with Nicole Kindred, RN and found to be clear of any abnormal marks. Patient searched and no contraband found and unit policies explained and understanding verbalized. Consents obtained. Food and fluids offered, and both accepted. Patient had no additional questions or concerns at this time. Patient remains safe on the unit. ? ?

## 2022-02-20 NOTE — Plan of Care (Signed)
New admission. ? ?Problem: Education: ?Goal: Knowledge of [facility] Education information/materials will improve ?Outcome: Not Progressing ?Goal: Emotional status will improve ?Outcome: Not Progressing ?Goal: Mental status will improve ?Outcome: Not Progressing ?Goal: Verbalization of understanding the information provided will improve ?Outcome: Not Progressing ?  ?Problem: Health Behavior/Discharge Planning: ?Goal: Compliance with treatment plan for underlying cause of condition will improve ?Outcome: Not Progressing ?  ?Problem: Safety: ?Goal: Periods of time without injury will increase ?Outcome: Not Progressing ?  ?Problem: Coping: ?Goal: Coping ability will improve ?Outcome: Not Progressing ?Goal: Will verbalize feelings ?Outcome: Not Progressing ?  ?Problem: Self-Concept: ?Goal: Level of anxiety will decrease ?Outcome: Not Progressing ?  ?Problem: Self-Concept: ?Goal: Ability to identify factors that promote anxiety will improve ?Outcome: Not Progressing ?Goal: Level of anxiety will decrease ?Outcome: Not Progressing ?  ?

## 2022-02-20 NOTE — Progress Notes (Signed)
Telepsych Consultation  ?  ?Reason for Consult:  Paranoia ?Referring Physician:  Teressa Lower, MD ?Location of Patient: [facility] ED ?Location of Provider: Other: [facility] ? ?Patient Identification: Misty Howell ?MRN:  [id number] ?Principal Diagnosis: Bipolar I disorder, current or most recent episode hypomanic ([place]) ?Diagnosis:  Principal Problem: ?  Bipolar I disorder, current or most recent episode hypomanic ([place]) ?Active Problems: ?  GAD (generalized anxiety disorder) ?  PTSD (post-traumatic stress disorder) ? ?Misty Howell is a 56 y.o. female patient admitted to [facility] ED after presenting via her mother with complaints ?I've been upset, I just need to talk to a counselor? and ?Talk about the things that happened to me when I was a little girl?.  Reported no sleep more than 2-3 hours in a couple of days.  Also, paranoia not wanting to disclose things because it will be exposed to the Internet. Also, feels that she is being monitored and watched. ? ?Patient was seen and evaluated by Shuvon Rankin, [place] recommended for inpatient treatment. Patient was accepted for transfer to [facility] for inpatient psychiatric treatment. Nursing staff report that on attempted transfer via safe transport that the patient became paranoid and instructed the driver to return to [facility]. The patient's behavior reportedly frightened the safe transport driver.  ? ?Patient was reevaluated this evening to determine if the patient continues to meet inpatient treatment criteria. ? ?On evaluation, patient is alert and oriented x 4. Speech is clear and coherent. Patient appears to be extremely anxious and paranoid. She is unable to describe why she is "scared" or identify any triggers for her anxiety/paranoia. She repeats several times "I am just scared. I don't know why." She denies auditory and visual hallucinations. No indication that she is currently responding to internal stimuli. She denies suicidal ideations. She denies homicidal ideations.   ? ?On chart review, it is noted that the patient removed her clothing today and was standing in the hallway without any clothing on.  ? ?Disposition: Recommend psychiatric Inpatient admission when medically cleared. ?Supportive therapy provided about ongoing stressors. ?Recommend involuntary committment if patient continues to refuse inpatient admission.   Discussed with Dr. Stark Jock and nurse via secure chat.  ?
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
## 2022-02-20 NOTE — Tx Team (Signed)
Initial Treatment Plan ?02/20/2022 ?6:36 PM ?Misty Howell ?[id number] ? ? ? ?PATIENT STRESSORS: ?Financial difficulties   ?Legal issue   ?Medication change or noncompliance   ? ? ?PATIENT STRENGTHS: ?Ability for insight  ?Communication skills  ?Motivation for treatment/growth  ?Supportive family/friends  ? ? ?PATIENT IDENTIFIED PROBLEMS: ?Paranoia  ?Anxiety  ?Depression  ?Medication noncompliance  ?  ?  ?  ?  ?  ?  ? ?DISCHARGE CRITERIA:  ?Ability to meet basic life and health needs ?Improved stabilization in mood, thinking, and/or behavior ?Motivation to continue treatment in a less acute level of care ?Need for constant or close observation no longer present ?Reduction of life-threatening or endangering symptoms to within safe limits ? ?PRELIMINARY DISCHARGE PLAN: ?Outpatient therapy ?Return to previous living arrangement ? ?PATIENT/FAMILY INVOLVEMENT: ?This treatment plan has been presented to and reviewed with the patient, Misty Howell. The patient has been given the opportunity to ask questions and make suggestions. ? ?Lyda Kalata, RN ?02/20/2022, 6:36 PM ?

## 2022-02-20 NOTE — Progress Notes (Signed)
Patient calm and cooperative during assessment denying SI/HI/AVH. Pt endorses anxiety and depression. Patient observed interacting appropriately with staff and peers on the unit. Pt compliant with medication administration per MD orders. Pt given education, support, and encouragement to be active in her treatment plan. Pt being monitored Q 15 minutes for safety per unit protocol. Pt remains safe on the unit.  ?

## 2022-02-20 NOTE — ED Notes (Signed)
This Probation officer is Faxing IVC papers to [place] ?

## 2022-02-20 NOTE — ED Notes (Signed)
RN spoke with Catalina Antigua, [facility] RN and advised that patient has impending IVC and will be transferred to [facility] in the AM when [facility] starts transports for the day-Monique,RN  ?

## 2022-02-20 NOTE — ED Notes (Signed)
PT transported BY sheriff ?

## 2022-02-20 NOTE — ED Notes (Signed)
Per report at this time we are waiting for IVC Paper work to be completed . Pt refused transport on 02-19-22 with safe trans port. Safe transport brought Pt back to [facility] last night because Pt was agitated during drive to [facility] ?Marland Kitchen ?

## 2022-02-21 DIAGNOSIS — F3163 Bipolar disorder, current episode mixed, severe, without psychotic features: Secondary | ICD-10-CM

## 2022-02-21 DIAGNOSIS — G40909 Epilepsy, unspecified, not intractable, without status epilepticus: Secondary | ICD-10-CM

## 2022-02-21 LAB — LIPID PANEL
Cholesterol: 138 mg/dL (ref 0–200)
HDL: 42 mg/dL (ref >40)
LDL Cholesterol: 62 mg/dL (ref 0–99)
Total CHOL/HDL Ratio: 3.3 RATIO
Triglycerides: 172 mg/dL — ABNORMAL HIGH (ref <150)
VLDL: 34 mg/dL (ref 0–40)

## 2022-02-21 LAB — HEMOGLOBIN A1C
Hgb A1c MFr Bld: 5.8 % — ABNORMAL HIGH (ref 4.8–5.6)
Mean Plasma Glucose: 119.76 mg/dL

## 2022-02-21 MED ORDER — QUETIAPINE FUMARATE 100 MG PO TABS
100.0000 mg | ORAL_TABLET | Freq: Every day | ORAL | Status: DC
  Administered 2022-02-21: 100 mg via ORAL
  Filled 2022-02-21: qty 1

## 2022-02-21 NOTE — Progress Notes (Signed)
Recreation Therapy Notes ? ?Date: 02/21/2022 ? ?Time: 10:40 am   ? ?Location: Craft room   ? ?Behavioral response: Appropriate ? ?Intervention Topic: Relaxation   ? ?Discussion/Intervention:  ?Group content today was focused on relaxation. The group defined relaxation and identified healthy ways to relax. Individuals expressed how much time they spend relaxing. Patients expressed how much their life would be if they did not make time for themselves to relax. The group stated ways they could improve their relaxation techniques in the future.  Individuals participated in the intervention ?Time to Relax? where they had a chance to experience different relaxation techniques.  ?Clinical Observations/Feedback: ?Patient came to group late due to unknown reasons. Individual was social with peers and staff while participating in the intervention.    ?Carl Bleecker LRT/CTRS  ? ? ? ? ? ? ? ?Misty Howell ?02/21/2022 12:44 PM ?

## 2022-02-21 NOTE — Progress Notes (Signed)
Recreation Therapy Notes ? ?INPATIENT RECREATION THERAPY ASSESSMENT ? ?Patient Details ?Name: NIKESHA KWASNY ?MRN: [id number] ?DOB: [birth year] ?Today's Date: 02/21/2022 ?      ?Information Obtained From: ?Patient ? ?Able to Participate in Assessment/Interview: ?Yes ? ?Patient Presentation: ?Responsive ? ?Reason for Admission (Per Patient): ?Active Symptoms ? ?Patient Stressors: ?  ? ?Coping Skills:   ?Music, Deep Breathing, Prayer ? ?Leisure Interests (2+):  ?Sports - Basketball, [place], Music - Listen, Social - Family ([place], [place], Psychologist, forensic) ? ?Frequency of Recreation/Participation: ?Monthly ? ?Awareness of Community Resources:  ?Yes ? ?Community Resources:  ?[employer], [place] ? ?Current Use: ?Yes ? ?If no, Barriers?: ?  ? ?Expressed Interest in [employer] Information: ?Yes ? ?[place] of Residence:  ?[place] ? ?Patient Main Form of Transportation: ?Car ? ?Patient Strengths:  ?Optimistic ? ?Patient Identified Areas of Improvement:  ?To feel safe ? ?Patient Goal for Hospitalization:  ?Find a different path ? ?Current SI (including self-harm):  ?No ? ?Current HI:  ?No ? ?Current AVH: ?No ? ?Staff Intervention Plan: ?Group Attendance, Collaborate with Interdisciplinary Treatment Team ? ?Consent to Intern Participation: ?N/A ? ?Archibald Marchetta ?02/21/2022, 3:49 PM ?

## 2022-02-21 NOTE — BH IP Treatment Plan (Signed)
Interdisciplinary Treatment and Diagnostic Plan Update ? ?02/21/2022 ?Time of Session: 1000 ?Misty Howell ?MRN: [id number] ? ?Principal Diagnosis: Bipolar 1 disorder, mixed, severe ([place]) ? ?Secondary Diagnoses: Principal Problem: ?  Bipolar 1 disorder, mixed, severe ([place]) ? ? ?Current Medications:  ?Current Facility-Administered Medications  ?Medication Dose Route Frequency Provider Last Rate Last Admin  ? acetaminophen (TYLENOL) tablet 650 mg  650 mg Oral Q6H PRN Clapacs, John T, MD      ? alum & mag hydroxide-simeth (MAALOX/MYLANTA) 200-200-20 MG/5ML suspension 30 mL  30 mL Oral Q4H PRN Clapacs, John T, MD      ? divalproex (DEPAKOTE SPRINKLE) capsule 500 mg  500 mg Oral Q12H Clapacs, Madie Reno, MD   500 mg at 02/21/22 2458  ? hydrOXYzine (ATARAX) tablet 50 mg  50 mg Oral TID PRN Clapacs, Madie Reno, MD   50 mg at 02/21/22 0846  ? levETIRAcetam (KEPPRA) tablet 500 mg  500 mg Oral BID Clapacs, John T, MD   500 mg at 02/21/22 0998  ? magnesium hydroxide (MILK OF MAGNESIA) suspension 30 mL  30 mL Oral Daily PRN Clapacs, Madie Reno, MD      ? OLANZapine zydis (ZYPREXA) disintegrating tablet 10 mg  10 mg Oral BID Clapacs, Madie Reno, MD   10 mg at 02/21/22 0956  ? temazepam (RESTORIL) capsule 15 mg  15 mg Oral QHS PRN Clapacs, Madie Reno, MD   15 mg at 02/20/22 2150  ? ?PTA Medications: ?Medications Prior to Admission  ?Medication Sig Dispense Refill Last Dose  ? divalproex (DEPAKOTE SPRINKLE) 125 MG capsule Take 4 capsules (500 mg total) by mouth every 12 (twelve) hours. (Patient not taking: Reported on 02/19/2022) 240 capsule 0   ? haloperidol (HALDOL) 5 MG tablet Take 1 tablet (5 mg total) by mouth 2 (two) times daily. (Patient not taking: Reported on 02/19/2022) 60 tablet 0   ? hydrOXYzine (ATARAX) 25 MG tablet Take 1 tablet (25 mg total) by mouth 3 (three) times daily as needed for anxiety. (Patient not taking: Reported on 02/19/2022) 30 tablet 0   ? levETIRAcetam (KEPPRA) 500 MG tablet Take 1 tablet (500 mg total) by mouth 2 (two)  times daily. 60 tablet 0   ? lidocaine (LIDODERM) 5 % Place 1 patch onto the skin daily. Remove & Discard patch within 12 hours or as directed by MD (Patient not taking: Reported on 01/04/2022) 30 patch 0   ? lithium carbonate (ESKALITH) 450 MG CR tablet Take 1 tablet (450 mg total) by mouth every 12 (twelve) hours. (Patient not taking: Reported on 02/19/2022) 60 tablet 0   ? OLANZapine zydis (ZYPREXA) 10 MG disintegrating tablet Take 1 tablet (10 mg total) by mouth 2 (two) times daily. (Patient not taking: Reported on 02/19/2022) 60 tablet 0   ? propranolol ER (INDERAL LA) 60 MG 24 hr capsule Take 1 capsule (60 mg total) by mouth daily. (Patient not taking: Reported on 02/19/2022) 30 capsule 0   ? traZODone (DESYREL) 50 MG tablet Take 1 tablet (50 mg total) by mouth at bedtime as needed for sleep. (Patient not taking: Reported on 02/19/2022) 30 tablet 0   ? Vitamin D, Ergocalciferol, (DRISDOL) 1.25 MG (50000 UNIT) CAPS capsule Take 1 capsule (50,000 Units total) by mouth every 7 (seven) days. (Patient not taking: Reported on 02/19/2022) 5 capsule 0   ? ? ?Patient Stressors: Financial difficulties   ?Legal issue   ?Medication change or noncompliance   ? ?Patient Strengths: Ability for insight  ?Communication skills  ?  Motivation for treatment/growth  ?Supportive family/friends  ? ?Treatment Modalities: Medication Management, Group therapy, Case management,  ?1 to 1 session with clinician, Psychoeducation, Recreational therapy. ? ? ?Physician Treatment Plan for Primary Diagnosis: Bipolar 1 disorder, mixed, severe ([place]) ?Long Term Goal(s):    ? ?Short Term Goals:   ? ?Medication Management: Evaluate patient's response, side effects, and tolerance of medication regimen. ? ?Therapeutic Interventions: 1 to 1 sessions, Unit Group sessions and Medication administration. ? ?Evaluation of Outcomes: Not Met ? ?Physician Treatment Plan for Secondary Diagnosis: Principal Problem: ?  Bipolar 1 disorder, mixed, severe ([place]) ? ?Long Term  Goal(s):    ? ?Short Term Goals:      ? ?Medication Management: Evaluate patient's response, side effects, and tolerance of medication regimen. ? ?Therapeutic Interventions: 1 to 1 sessions, Unit Group sessions and Medication administration. ? ?Evaluation of Outcomes: Not Met ? ? ?RN Treatment Plan for Primary Diagnosis: Bipolar 1 disorder, mixed, severe ([place]) ?Long Term Goal(s): Knowledge of disease and therapeutic regimen to maintain health will improve ? ?Short Term Goals: Ability to remain free from injury will improve, Ability to verbalize frustration and anger appropriately will improve, Ability to demonstrate self-control, Ability to participate in decision making will improve, Ability to verbalize feelings will improve, Ability to disclose and discuss suicidal ideas, Ability to identify and develop effective coping behaviors will improve, and Compliance with prescribed medications will improve ? ?Medication Management: RN will administer medications as ordered by provider, will assess and evaluate patient's response and provide education to patient for prescribed medication. RN will report any adverse and/or side effects to prescribing provider. ? ?Therapeutic Interventions: 1 on 1 counseling sessions, Psychoeducation, Medication administration, Evaluate responses to treatment, Monitor vital signs and CBGs as ordered, Perform/monitor CIWA, COWS, AIMS and Fall Risk screenings as ordered, Perform wound care treatments as ordered. ? ?Evaluation of Outcomes: Not Met ? ? ?LCSW Treatment Plan for Primary Diagnosis: Bipolar 1 disorder, mixed, severe ([place]) ?Long Term Goal(s): Safe transition to appropriate next level of care at discharge, Engage patient in therapeutic group addressing interpersonal concerns. ? ?Short Term Goals: Engage patient in aftercare planning with referrals and resources, Increase social support, Increase ability to appropriately verbalize feelings, Increase emotional regulation, Facilitate  acceptance of mental health diagnosis and concerns, Facilitate patient progression through stages of change regarding substance use diagnoses and concerns, Identify triggers associated with mental health/substance abuse issues, and Increase skills for wellness and recovery ? ?Therapeutic Interventions: Assess for all discharge needs, 1 to 1 time with Education officer, museum, Explore available resources and support systems, Assess for adequacy in community support network, Educate family and significant other(s) on suicide prevention, Complete Psychosocial Assessment, Interpersonal group therapy. ? ?Evaluation of Outcomes: Not Met ? ? ?Progress in Treatment: ?Attending groups: No. ?Participating in groups: No. ?Taking medication as prescribed: Yes. ?Toleration medication: Yes. ?Family/Significant other contact made: No, will contact:  CSW to obtain consent to reach family/friend. ?Patient understands diagnosis: No. ?Discussing patient identified problems/goals with staff: Yes. ?Medical problems stabilized or resolved: Yes. ?Denies suicidal/homicidal ideation: No. ?Issues/concerns per patient self-inventory: Yes. ?Other: none ? ?New problem(s) identified: No, Describe:  No additional problems/concerns identified at this time.  ? ?New Short Term/Long Term Goal(s): Patient to work towards elimination of symptoms of psychosis, medication management for mood stabilization; elimination of SI thoughts; development of comprehensive mental wellness plan. ? ?Patient Goals:  States "fresh start for me and my son, get some treatment and counseling."  ? ?Discharge Plan or Barriers: No psychosocial barriers  identified at this time, patient to return to place of residence when appropriate for discharge.  ? ?Reason for Continuation of Hospitalization: Delusions  ?Other; describe Paranoid, disorganized behaviors.  ? ?Estimated Length of Stay: ? ?Last 3 [place] Suicide Severity Risk Score: ?[facility] Admission (Current) from 02/20/2022 in  [facility] ED from 02/18/2022 in [facility] Admission (Discharged) from 01/06/2022 in [facility]  ?C-SSRS [place]

## 2022-02-21 NOTE — BHH Suicide Risk Assessment (Signed)
[facility] Admission Suicide Risk Assessment ? ? ?Nursing information obtained from:  Patient ?Demographic factors:  Divorced or widowed, Caucasian, Unemployed ?Current Mental Status:  NA ?Loss Factors:  Legal issues, Financial problems / change in socioeconomic status ?Historical Factors:  Anniversary of important loss ?Risk Reduction Factors:  Responsible for children under 55 years of age, Sense of responsibility to family, Living with another person, especially a relative ? ?Total Time spent with patient: 1 hour ?Principal Problem: Bipolar 1 disorder, mixed, severe ([place]) ?Diagnosis:  Principal Problem: ?  Bipolar 1 disorder, mixed, severe ([place]) ?Active Problems: ?  Vitamin D deficiency ?  Seizure disorder ([place]) ? ?Subjective Data: Patient transferred to [place] from [facility] for further psychiatric treatment.  56 year old woman who presented there with severe symptoms of anxiety and depression accompanied by paranoid and disorganized thinking poor self-care and no outpatient psychiatric follow-up.  On interview the patient is tearful and somewhat agitated but not at all aggressive.  She denies any suicidal thought.  Focuses on being fearful for herself and her family.  Insight not very good. ? ?Continued Clinical Symptoms:  ?Alcohol Use Disorder Identification Test Final Score (AUDIT): 0 ?The "Alcohol Use Disorders Identification Test", Guidelines for Use in Primary Care, Second Edition.  World Pharmacologist [facility]). ?Score between 0-7:  no or low risk or alcohol related problems. ?Score between 8-15:  moderate risk of alcohol related problems. ?Score between 16-19:  high risk of alcohol related problems. ?Score 20 or above:  warrants further diagnostic evaluation for alcohol dependence and treatment. ? ? ?CLINICAL FACTORS:  ? Severe Anxiety and/or Agitation ?Bipolar Disorder:   Mixed State ? ? ?Musculoskeletal: ?Strength & Muscle Tone: within normal limits ?Gait & Station: normal ?Patient leans: N/A ? ?Psychiatric  Specialty Exam: ? ?Presentation  ?General Appearance: Appropriate for Environment ? ?Eye Contact:Good ? ?Speech:Clear and Coherent; Normal Rate ? ?Speech Volume:Normal ? ?Handedness:Right ? ? ?Mood and Affect  ?Mood:Anxious ? ?Affect:Congruent ? ? ?Thought Process  ?Thought Processes:Coherent; Linear ? ?Descriptions of Associations:Circumstantial ? ?Orientation:Full (Time, Place and Person) ? ?Thought Content:Paranoid Ideation ? ?History of Schizophrenia/Schizoaffective disorder:No ? ?Duration of Psychotic Symptoms:N/A ? ?Hallucinations:No data recorded ?Ideas of Reference:None ? ?Suicidal Thoughts:No data recorded ?Homicidal Thoughts:No data recorded ? ?Sensorium  ?Memory:Immediate Fair; Recent Fair ? ?Judgment:Fair ? ?Insight:Present ? ? ?Executive Functions  ?Concentration:Fair ? ?Attention Span:Fair ? ?Recall:Fair ? ?[place] ? ?Language:Good ? ? ?Psychomotor Activity  ?Psychomotor Activity:No data recorded ? ?Assets  ?Assets:Communication Skills; Desire for Improvement; Financial Resources/Insurance; Housing; Social Support ? ? ?Sleep  ?Sleep:No data recorded ? ? ?Physical Exam: ?Physical Exam ?Vitals and nursing note reviewed.  ?Constitutional:   ?   Appearance: Normal appearance.  ?HENT:  ?   Head: Normocephalic and atraumatic.  ?   Mouth/Throat:  ?   Pharynx: Oropharynx is clear.  ?Eyes:  ?   Pupils: Pupils are equal, round, and reactive to light.  ?Cardiovascular:  ?   Rate and Rhythm: Normal rate and regular rhythm.  ?Pulmonary:  ?   Effort: Pulmonary effort is normal.  ?   Breath sounds: Normal breath sounds.  ?Abdominal:  ?   General: Abdomen is flat.  ?   Palpations: Abdomen is soft.  ?Musculoskeletal:     ?   General: Normal range of motion.  ?Skin: ?   General: Skin is warm and dry.  ?Neurological:  ?   General: No focal deficit present.  ?   Mental Status: She is alert. Mental status is at baseline.  ?Psychiatric:     ?  Attention and Perception: She is inattentive.     ?   Mood and  Affect: Mood is anxious and depressed. Affect is tearful.     ?   Speech: Speech is tangential.     ?   Behavior: Behavior is agitated and withdrawn. Behavior is not aggressive.     ?   Thought Content: Thought content is paranoid and delusional. Thought content does not include homicidal or suicidal ideation.     ?   Cognition and Memory: Cognition is impaired. Memory is impaired.     ?   Judgment: Judgment is inappropriate.  ? ?Review of Systems  ?Constitutional: Negative.   ?HENT: Negative.    ?Eyes: Negative.   ?Respiratory: Negative.    ?Cardiovascular: Negative.   ?Gastrointestinal: Negative.   ?Musculoskeletal: Negative.   ?Skin: Negative.   ?Neurological: Negative.   ?Psychiatric/Behavioral:  Positive for depression, hallucinations and memory loss. Negative for substance abuse and suicidal ideas. The patient is nervous/anxious and has insomnia.   ?Blood pressure (!) 110/92, pulse (!) 128, temperature 97.8 ?F (36.6 ?C), temperature source Oral, resp. rate 18, height '5\' 4"'$  (1.626 m), weight 86.2 kg, SpO2 97 %. Body mass index is 32.61 kg/m?. ? ? ?COGNITIVE FEATURES THAT CONTRIBUTE TO RISK:  ?Thought constriction (tunnel vision)   ? ?SUICIDE RISK:  ? Mild:  Suicidal ideation of limited frequency, intensity, duration, and specificity.  There are no identifiable plans, no associated intent, mild dysphoria and related symptoms, good self-control (both objective and subjective assessment), few other risk factors, and identifiable protective factors, including available and accessible social support. ? ?PLAN OF CARE: Continue 15-minute checks.  Engage in individual and group therapy and assessment.  Reviewed medication plans with patient and suggested some medication changes to address current symptoms.  Ongoing assessment of dangerousness prior to discharge ? ?I certify that inpatient services furnished can reasonably be expected to improve the patient's condition.  ? ?Alethia Berthold, MD ?02/21/2022, 11:02 AM ? ?
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
## 2022-02-21 NOTE — Progress Notes (Signed)
Recreation Therapy Notes ? ?INPATIENT RECREATION TR PLAN ? ?Patient Details ?Name: Misty Howell ?MRN: [id number] ?DOB: Apr 27, 1966 ?Today's Date: 02/21/2022 ? ?Rec Therapy Plan ?Is patient appropriate for Therapeutic Recreation?: Yes ?Treatment times per week: at least 3 ?Estimated Length of Stay: 5-7 days ?TR Treatment/Interventions: Group participation (Comment) ? ?Discharge Criteria ?Pt will be discharged from therapy if:: Discharged ?Treatment plan/goals/alternatives discussed and agreed upon by:: Patient/family ? ?Discharge Summary ?  ? ? ?Venetia Prewitt ?02/21/2022, 3:51 PM ?

## 2022-02-21 NOTE — Progress Notes (Signed)
Pt's mother called asking about pt and visiting hours. Mother was informed without a code no information could be provided. Mother received visiting hour information but reminded that without the pt's code she would be unable to visit and once again d/t HIPPA this writer could not confirm or deny the pt's presents on the unit. This writer went to the patient and asked them to please call their mother and provide them with their code. Pt was provided with the code to give to their mother.  ? ? ?
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
## 2022-02-21 NOTE — BHH Counselor (Signed)
CSW spoke with the patient about completing an SPE.  Pt declined at this time, stating that she wanted to think about it.  ? ?Pt declined further for a referral for treatment at this time.   ? ?Pt stated that she will review with weekend CSW. ? ?Assunta Curtis, MSW, LCSW ?02/21/2022 3:50 PM  ?

## 2022-02-21 NOTE — Plan of Care (Signed)
D: Pt alert and oriented. Pt expresses experiencing depression and anxiety. Pt denies experiencing any pain at this time. Pt denies experiencing any SI/HI, or AVH at this time.  ? ?Pt appears anxious/paranoid/religious/mildly confused. Pt comes to the nurse's station frequently asking about the time, the date, for another toothbrush, another pair of socks. When asked where the toothbrush, socks, or toothpaste last given are the pt reports she doesn't know. Pt shares concern for families well being.  ? ?A: Scheduled medications administered to pt, per MD orders. Support and encouragement provided. Frequent verbal contact made. Routine safety checks conducted q15 minutes.  ? ?R: No adverse drug reactions noted. Pt verbally contracts for safety at this time. Pt compliant with medications. Pt interacts minimally with others on the unit, mostly self isolating to room with exception to meals and medications. Pt remains safe at this time. Will continue to monitor.  ? ?Problem: Education: ?Goal: Emotional status will improve ?Outcome: Not Progressing ?Goal: Mental status will improve ?Outcome: Not Progressing ?  ?

## 2022-02-21 NOTE — Group Note (Signed)
[facility] LCSW Group Therapy Note ? ? ?Group Date: 02/21/2022 ?Start Time: 1300 ?End Time: 1400 ? ?Type of Therapy and Topic:  Group Therapy:  Feelings around Relapse and Recovery ? ?Participation Level:  Did Not Attend  ? ?Mood: ? ?Description of Group:   ? Patients in this group will discuss emotions they experience before and after a relapse. They will process how experiencing these feelings, or avoidance of experiencing them, relates to having a relapse. Facilitator will guide patients to explore emotions they have related to recovery. Patients will be encouraged to process which emotions are more powerful. They will be guided to discuss the emotional reaction significant others in their lives may have to patients? relapse or recovery. Patients will be assisted in exploring ways to respond to the emotions of others without this contributing to a relapse. ? ?Therapeutic Goals: ?Patient will identify two or more emotions that lead to relapse for them:  ?Patient will identify two emotions that result when they relapse:  ?Patient will identify two emotions related to recovery:  ?Patient will demonstrate ability to communicate their needs through discussion and/or role plays. ? ? ?Summary of Patient Progress: ? ?Patient did not attend group despite encouraged participation.  ? ? ?Therapeutic Modalities:   ?Cognitive Behavioral Therapy ?Solution-Focused Therapy ?Assertiveness Training ?Relapse Prevention Therapy ? ? ?[name], LCSWA ?

## 2022-02-22 DIAGNOSIS — F3163 Bipolar disorder, current episode mixed, severe, without psychotic features: Secondary | ICD-10-CM | POA: Diagnosis not present

## 2022-02-22 MED ORDER — QUETIAPINE FUMARATE 200 MG PO TABS
200.0000 mg | ORAL_TABLET | Freq: Every day | ORAL | Status: DC
  Administered 2022-02-22: 200 mg via ORAL
  Filled 2022-02-22: qty 1
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
## 2022-02-22 NOTE — Plan of Care (Signed)
?  Problem: Education: ?Goal: Knowledge of [place] [facility] Education information/materials will improve ?Outcome: Not Progressing ?Goal: Emotional status will improve ?Outcome: Not Progressing ?Goal: Mental status will improve ?Outcome: Not Progressing ?Goal: Verbalization of understanding the information provided will improve ?Outcome: Not Progressing ?  ?Problem: Health Behavior/Discharge Planning: ?Goal: Compliance with treatment plan for underlying cause of condition will improve ?Outcome: Not Progressing ?  ?Problem: Safety: ?Goal: Periods of time without injury will increase ?Outcome: Not Progressing ?  ?Problem: Coping: ?Goal: Coping ability will improve ?Outcome: Not Progressing ?Goal: Will verbalize feelings ?Outcome: Not Progressing ?  ?Problem: Self-Concept: ?Goal: Level of anxiety will decrease ?Outcome: Not Progressing ?  ?Problem: Self-Concept: ?Goal: Ability to identify factors that promote anxiety will improve ?Outcome: Not Progressing ?Goal: Level of anxiety will decrease ?Outcome: Not Progressing ?  ?

## 2022-02-22 NOTE — Progress Notes (Signed)
[facility] MD Progress Note ? ?02/22/2022 11:23 AM ?Misty Howell  ?MRN:  [id number] ?Subjective: Patient seen for follow-up.  Patient was in her room with the lights off again today.  Seemed a little more appropriate in her behavior however.  Patient still appeared anxious in her affect but was not tearful or agitated.  Slightly more lucid in conversation but still clearly paranoid and unable to really understand or articulate her situation.  She was compliant with medicine last night and slept adequately but is not over sedated this morning.  Vitals unremarkable.  Labs show only slightly elevated LDL of normal hemoglobin A1c ?Principal Problem: Bipolar 1 disorder, mixed, severe ([place]) ?Diagnosis: Principal Problem: ?  Bipolar 1 disorder, mixed, severe ([place]) ?Active Problems: ?  Vitamin D deficiency ?  Seizure disorder ([place]) ? ?Total Time spent with patient: 30 minutes ? ?Past Psychiatric History: Past history of either bipolar or psychotic depression ? ?Past Medical History:  ?Past Medical History:  ?Diagnosis Date  ? Anxiety   ? Cancer [facility])   ? Giant cell tumor left fifth finger.  ? Hypercholesterolemia   ? Overweight(278.02)   ? Seizures ([place])   ? Syncope and collapse   ? Vision abnormalities   ? Vitamin D deficiency   ?  ?Past Surgical History:  ?Procedure Laterality Date  ? CESAREAN SECTION    ? FINGER SURGERY    ? due to giant cell tumor, hip bone placed in finger  ? ?Family History:  ?Family History  ?Problem Relation Age of Onset  ? Hypertension Mother   ? Migraines Mother   ? Hypertension Father   ? ?Family Psychiatric  History: See previous ?Social History:  ?Social History  ? ?Substance and Sexual Activity  ?Alcohol Use Not Currently  ? Comment: social  ?   ?Social History  ? ?Substance and Sexual Activity  ?Drug Use Yes  ? Types: Marijuana  ? Comment: Pt stated she used Delta 8 a couple weeks ago  ?  ?Social History  ? ?Socioeconomic History  ? Marital status: Legally Separated  ?  Spouse name: Not on file  ?  Number of children: Not on file  ? Years of education: Not on file  ? Highest education level: Not on file  ?Occupational History  ? Occupation: Finance  ?Tobacco Use  ? Smoking status: Former  ?  Packs/day: 0.00  ?  Years: 3.00  ?  Pack years: 0.00  ?  Types: Cigarettes  ?  Quit date: 01/20/1989  ?  Years since quitting: 33.1  ? Smokeless tobacco: Never  ?Vaping Use  ? Vaping Use: Never used  ?Substance and Sexual Activity  ? Alcohol use: Not Currently  ?  Comment: social  ? Drug use: Yes  ?  Types: Marijuana  ?  Comment: Pt stated she used Delta 8 a couple weeks ago  ? Sexual activity: Not Currently  ?Other Topics Concern  ? Not on file  ?Social History Narrative  ? Pt lives in [place] with mother and two teenaged sons.  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  Not followed by an outpatient psych provider.  ? ?Social Determinants of Health  ? ?Financial Resource Strain: Not on file  ?Food Insecurity: Not on file  ?Transportation Needs: Not on file  ?Physical Activity: Not on file  ?Stress: Not on file  ?Social Connections: Not on file  ? ?Additional Social History:  ?  ?  ?  ?  ?  ?  ?  ?  ?  ?  ?  ? ?  Sleep: Fair ? ?Appetite:  Fair ? ?Current Medications: ?Current Facility-Administered Medications  ?Medication Dose Route Frequency Provider Last Rate Last Admin  ? acetaminophen (TYLENOL) tablet 650 mg  650 mg Oral Q6H PRN Aleks Nawrot T, MD      ? alum & mag hydroxide-simeth (MAALOX/MYLANTA) 200-200-20 MG/5ML suspension 30 mL  30 mL Oral Q4H PRN Geza Beranek T, MD      ? hydrOXYzine (ATARAX) tablet 50 mg  50 mg Oral TID PRN Leimomi Zervas, Madie Reno, MD   50 mg at 02/21/22 0846  ? levETIRAcetam (KEPPRA) tablet 500 mg  500 mg Oral BID Zakari Couchman T, MD   500 mg at 02/22/22 0915  ? magnesium hydroxide (MILK OF MAGNESIA) suspension 30 mL  30 mL Oral Daily PRN Corynne Scibilia, Madie Reno, MD      ? QUEtiapine (SEROQUEL) tablet 200 mg  200 mg Oral QHS Jerrie Schussler T, MD      ? temazepam (RESTORIL) capsule 15 mg  15 mg Oral QHS PRN Derry Kassel, Madie Reno, MD   15 mg at 02/20/22 2150  ? ? ?Lab Results:  ?Results for orders placed or performed during the hospital encounter of 02/20/22 (from the past 48 hour(s))  ?Hemoglobin A1c     Status: Abnormal  ? Collection Time: 02/21/22  6:54 AM  ?Result Value Ref Range  ? Hgb A1c MFr Bld 5.8 (H) 4.8 - 5.6 %  ?  Comment: (NOTE) ?Pre diabetes:          5.7%-6.4% ? ?Diabetes:              >6.4% ? ?Glycemic control for   <7.0% ?adults with diabetes ?  ? Mean Plasma Glucose 119.76 mg/dL  ?  Comment: Performed at [facility], [place] [address] [place], [place] [address]  ?Lipid panel     Status: Abnormal  ? Collection Time: 02/21/22  6:54 AM  ?Result Value Ref Range  ? Cholesterol 138 0 - 200 mg/dL  ? Triglycerides 172 (H) <150 mg/dL  ? HDL 42 >40 mg/dL  ? Total CHOL/HDL Ratio 3.3 RATIO  ? VLDL 34 0 - 40 mg/dL  ? LDL Cholesterol 62 0 - 99 mg/dL  ?  Comment:        ?Total Cholesterol/HDL:CHD Risk ?Coronary Heart Disease Risk Table ?                    Men   Women ? 1/2 Average Risk   3.4   3.3 ? Average Risk       5.0   4.4 ? 2 X Average Risk   9.6   7.1 ? 3 X Average Risk  23.4   11.0 ?       ?Use the calculated Patient Ratio ?above and the CHD Risk Table ?to determine the patient's CHD Risk. ?       ?ATP III CLASSIFICATION (LDL): ? <100     mg/dL   Optimal ? 100-129  mg/dL   Near or Above ?                   Optimal ? 130-159  mg/dL   Borderline ? 160-189  mg/dL   High ? >190     mg/dL   Very High ?Performed at [facility], [address] [place],  [address] ?  ? ? ?Blood Alcohol level:  ?Lab Results  ?Component Value Date  ? ETH <10 02/18/2022  ? ETH <10 01/03/2022  ? ? ?  Metabolic Disorder Labs: ?Lab Results  ?Component Value Date  ? HGBA1C 5.8 (H) 02/21/2022  ? MPG 119.76 02/21/2022  ? MPG 114.02 01/07/2022  ? ?No results found for: PROLACTIN ?Lab Results  ?Component Value Date  ? CHOL 138 02/21/2022  ? TRIG 172 (H) 02/21/2022  ? HDL 42 02/21/2022  ? CHOLHDL 3.3 02/21/2022  ? VLDL 34 02/21/2022  ?  [place] 62 02/21/2022  ? [place] 74 01/07/2022  ? ? ?Physical Findings: ?AIMS:  , ,  ,  ,    ?CIWA:    ?COWS:    ? ?Musculoskeletal: ?Strength & Muscle Tone: within normal limits ?Gait & Station: normal ?Patient leans: N/A ? ?Psychiatric Specialty Exam: ? ?Presentation  ?General Appearance: Appropriate for Environment ? ?Eye Contact:Good ? ?Speech:Clear and Coherent; Normal Rate ? ?Speech Volume:Normal ? ?Handedness:Right ? ? ?Mood and Affect  ?Mood:Anxious ? ?Affect:Congruent ? ? ?Thought Process  ?Thought Processes:Coherent; Linear ? ?Descriptions of Associations:Circumstantial ? ?Orientation:Full (Time, Place and Person) ? ?Thought Content:Paranoid Ideation ? ?History of Schizophrenia/Schizoaffective disorder:No ? ?Duration of Psychotic Symptoms:N/A ? ?Hallucinations:No data recorded ?Ideas of Reference:None ? ?Suicidal Thoughts:No data recorded ?Homicidal Thoughts:No data recorded ? ?Sensorium  ?Memory:Immediate Fair; Recent Fair ? ?Judgment:Fair ? ?Insight:Present ? ? ?Executive Functions  ?Concentration:Fair ? ?Attention Span:Fair ? ?Recall:Fair ? ?[place] ? ?Language:Good ? ? ?Psychomotor Activity  ?Psychomotor Activity:No data recorded ? ?Assets  ?Assets:Communication Skills; Desire for Improvement; Financial Resources/Insurance; Housing; Social Support ? ? ?Sleep  ?Sleep:No data recorded ? ? ?Physical Exam: ?Physical Exam ?Vitals and nursing note reviewed.  ?Constitutional:   ?   Appearance: Normal appearance.  ?HENT:  ?   Head: Normocephalic and atraumatic.  ?   Mouth/Throat:  ?   Pharynx: Oropharynx is clear.  ?Eyes:  ?   Pupils: Pupils are equal, round, and reactive to light.  ?Cardiovascular:  ?   Rate and Rhythm: Normal rate and regular rhythm.  ?Pulmonary:  ?   Effort: Pulmonary effort is normal.  ?   Breath sounds: Normal breath sounds.  ?Abdominal:  ?   General: Abdomen is flat.  ?   Palpations: Abdomen is soft.  ?Musculoskeletal:     ?   General: Normal range of motion.  ?Skin: ?    General: Skin is warm and dry.  ?Neurological:  ?   General: No focal deficit present.  ?   Mental Status: She is alert. Mental status is at baseline.  ?Psychiatric:     ?   Attention and Perception: Attentio

## 2022-02-22 NOTE — Plan of Care (Signed)
D: Pt alert and oriented. Pt denies experiencing any anxiety at this time. Pt however reports experiencing depression. Pt states that she depressed because she has no one to talk to.  Pt denies experiencing any pain at this time. Pt denies experiencing any SI/HI, or AVH at this time.  ? ?Pt is mildly confused. Pt continues to ask for additional toothbrushes/toothpaste. Pt educated on how it's one toothbrush per patient. Pt frequently asks if her mother and son are okay. Pt also asks frequently between meals if she can have a snack and soda. Pt has been educated the importance of filling out her menu, ordering things she likes to eat, that sodas are only given out during lunch and dinner, and that snack is at 830pm. Pt is frequently at the nurses station being redirected. ? ?A: Scheduled medications administered to pt, per MD orders. Support and encouragement provided. Frequent verbal contact made. Routine safety checks conducted q15 minutes.  ? ?R: No adverse drug reactions noted. Pt verbally contracts for safety at this time. Pt compliant with medications and treatment plan. Pt interacts poorly/minimally with others on the unit. Pt remains safe at this time. Will continue to monitor.  ? ?Problem: Education: ?Goal: Emotional status will improve ?Outcome: Not Progressing ?Goal: Mental status will improve ?Outcome: Not Progressing ?  ?

## 2022-02-22 NOTE — Progress Notes (Signed)
Patient is very paranoid. Asks the same questions over and over. Was found in the dark in her room and she said she was afraid people were putting her on social media. Denies SI, HI and AVH but is responding to voices. ?

## 2022-02-22 NOTE — Group Note (Signed)
[facility] LCSW Group Therapy Note ? ? ?Group Date: 02/22/2022 ?Start Time: 1300 ?End Time: 1400 ? ? ?Type of Therapy and Topic: Group Therapy: Avoiding Self-Sabotaging and Enabling Behaviors ? ?Participation Level: Active ? ?Mood: Labile ? ?Description of Group:  ?In this group, patients will learn how to identify obstacles, self-sabotaging and enabling behaviors, as well as: what are they, why do we do them and what needs these behaviors meet. Discuss unhealthy relationships and how to have positive healthy boundaries with those that sabotage and enable. Explore aspects of self-sabotage and enabling in yourself and how to limit these self-destructive behaviors in everyday life. ? ? ?Therapeutic Goals: ?1. Patient will identify one obstacle that relates to self-sabotage and enabling behaviors ?2. Patient will identify one personal self-sabotaging or enabling behavior they did prior to admission ?3. Patient will state a plan to change the above identified behavior ?4. Patient will demonstrate ability to communicate their needs through discussion and/or role play.  ? ? ?Summary of Patient Progress: Patient was present for the duration of group and participated in opening and closing remarks. Patient contributed to group discussion; however, patient was not able to identify a self-sabotaging behavior. She shared information that was irrelevant to the topic of discussion at times. Patient shared about how one of her sons passed away 14 months ago and was observed as tearful while talking about him. Patient expressed the desire to see a counselor, stating that she does not have anyone to talk to. Patient expressed paranoid thoughts, asking if her other son was safe, stating that she is worried about him and shared that she misses him.  ? ? ? ?Therapeutic Modalities:  ?Cognitive Behavioral Therapy ?Person-Centered Therapy ?Motivational Interviewing ? ? ? ?Kenna Gilbert Kaushal Vannice, LCSWA ?

## 2022-02-23 DIAGNOSIS — F3163 Bipolar disorder, current episode mixed, severe, without psychotic features: Secondary | ICD-10-CM | POA: Diagnosis not present

## 2022-02-23 MED ORDER — QUETIAPINE FUMARATE 200 MG PO TABS
300.0000 mg | ORAL_TABLET | Freq: Every day | ORAL | Status: DC
  Administered 2022-02-23: 300 mg via ORAL
  Filled 2022-02-23: qty 1
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
## 2022-02-23 NOTE — Plan of Care (Signed)
Met with pt in room. Pt continues to be confused about situation. Frequently seen by staff at the nurses stations for multiple requests. Pt is calm and cooperative. Denies SI / HI / AVH. Pt requests to make phone calls this afternoon. No signs of distress or injury. Assigned staff will continue q15 min safety rounds.  ? ? ?Problem: Education: ?Goal: Knowledge of [facility] Education information/materials will improve ?Outcome: Not Progressing ?Goal: Emotional status will improve ?Outcome: Not Progressing ?Goal: Mental status will improve ?Outcome: Not Progressing ?  ?

## 2022-02-23 NOTE — Progress Notes (Signed)
Patient was cooperative with treatment on shift. She denies SI, HI & AVH and spent most of her time resting in be quietly. She needed redirection x1 on on shift when she stated she was confused and needed a special snack upon waking up in the morning.  ?

## 2022-02-23 NOTE — Group Note (Signed)
LCSW Group Therapy Note ? ?Group Date: 02/23/2022 ?Start Time: 1310 ?End Time: 1400 ? ? ?Type of Therapy and Topic:  Group Therapy - Healthy vs Unhealthy Coping Skills ? ?Participation Level:  Minimal  ? ?Description of Group ?The focus of this group was to determine what unhealthy coping techniques typically are used by group members and what healthy coping techniques would be helpful in coping with various problems. Patients were guided in becoming aware of the differences between healthy and unhealthy coping techniques. Patients were asked to identify 2-3 healthy coping skills they would like to learn to use more effectively. ? ?Therapeutic Goals ?Patients learned that coping is what human beings do all day long to deal with various situations in their lives ?Patients defined and discussed healthy vs unhealthy coping techniques ?Patients identified their preferred coping techniques and identified whether these were healthy or unhealthy ?Patients determined 2-3 healthy coping skills they would like to become more familiar with and use more often. ?Patients provided support and ideas to each other ? ? ?Summary of Patient Progress: Patient presented on time for group and participated in the opening remarks. Patient did not contribute to group discussion but appeared to actively listen to other members. Patient appeared apprehensive throughout the session. ? ? ?Therapeutic Modalities ?Cognitive Behavioral Therapy ?Motivational Interviewing ? ?Kenna Gilbert Monserath Neff, LCSWA ?02/23/2022  3:02 PM   ?

## 2022-02-24 DIAGNOSIS — F3163 Bipolar disorder, current episode mixed, severe, without psychotic features: Secondary | ICD-10-CM | POA: Diagnosis not present

## 2022-02-24 MED ORDER — QUETIAPINE FUMARATE 200 MG PO TABS
400.0000 mg | ORAL_TABLET | Freq: Every day | ORAL | Status: DC
  Administered 2022-02-24 – 2022-02-27 (×4): 400 mg via ORAL
  Filled 2022-02-24 (×4): qty 2

## 2022-02-24 NOTE — Plan of Care (Signed)
?  Problem: Education: ?Goal: Knowledge of [facility] Education information/materials will improve ?Outcome: Progressing ?Goal: Emotional status will improve ?Outcome: Progressing ?Goal: Mental status will improve ?Outcome: Progressing ?Goal: Verbalization of understanding the information provided will improve ?Outcome: Progressing ?  ?Problem: Safety: ?Goal: Periods of time without injury will increase ?Outcome: Progressing ?  ?Problem: Coping: ?Goal: Coping ability will improve ?Outcome: Progressing ?Goal: Will verbalize feelings ?Outcome: Progressing ?  ?

## 2022-02-24 NOTE — Plan of Care (Signed)
D: Pt alert and oriented. Pt rates depression 10/10, hopelessness 7/10, and anxiety 4/10. Pt goal: "To improve my life to be a better role model for my son." Pt reports energy level as normal and concentration as being poor. Pt reports sleep last night as being good. Pt did not receive medications for sleep. Pt denies experiencing any pain at this time. Pt denies experiencing any SI/HI, or AVH at this time.  ? ?Pt still presents as being mildly confused. Pt's has been at the nurse's station slightly less today in comparison to prior days. Pt can be seen going back and forth between common areas and room throughout the day.  ? ?Pt's mother called this evening asking how patient was doing and showing concern for how she's progressing.  ? ?A: Scheduled medications administered to pt, per MD orders. Support and encouragement provided. Frequent verbal contact made. Routine safety checks conducted q15 minutes.  ? ?R: No adverse drug reactions noted. Pt verbally contracts for safety at this time. Pt compliant with medications and treatment plan. Pt interacts minimally with others on the unit. Pt remains safe at this time. Will continue to monitor.  ? ?Problem: Education: ?Goal: Emotional status will improve ?Outcome: Not Progressing ?Goal: Mental status will improve ?Outcome: Not Progressing ?  ?

## 2022-02-24 NOTE — Group Note (Signed)
[facility] LCSW Group Therapy Note ? ? ? ?Group Date: 02/24/2022 ?Start Time: 1300 ?End Time: 1400 ? ?Type of Therapy and Topic:  Group Therapy:  Overcoming Obstacles ? ?Participation Level:  [facility] PARTICIPATION LEVEL: None ? ?Mood: ? ?Description of Group:   ?In this group patients will be encouraged to explore what they see as obstacles to their own wellness and recovery. They will be guided to discuss their thoughts, feelings, and behaviors related to these obstacles. The group will process together ways to cope with barriers, with attention given to specific choices patients can make. Each patient will be challenged to identify changes they are motivated to make in order to overcome their obstacles. This group will be process-oriented, with patients participating in exploration of their own experiences as well as giving and receiving support and challenge from other group members. ? ?Therapeutic Goals: ?1. Patient will identify personal and current obstacles as they relate to admission. ?2. Patient will identify barriers that currently interfere with their wellness or overcoming obstacles.  ?3. Patient will identify feelings, thought process and behaviors related to these barriers. ?4. Patient will identify two changes they are willing to make to overcome these obstacles:  ? ? ?Summary of Patient Progress ?Patient was present in group.  Patient did not engage in group discussion.   ? ?Therapeutic Modalities:   ?Cognitive Behavioral Therapy ?Solution Focused Therapy ?Motivational Interviewing ?Relapse Prevention Therapy ? ? ?Rozann Lesches, LCSW ?

## 2022-02-24 NOTE — Progress Notes (Signed)
Patient presents pleasant and cooperative. Slightly anxious and states depressed and sad due to things she consider being stupid. Reports being mad herself for some things she did in the past. Patient denies any intent to harm self or others, she just wants to be better and not make the same mistakes.  ?Encouragement and support provided. Safety checks maintained. Medications given as prescribed. Pt receptive and remains safe on unit with q 15 min checks. ?

## 2022-02-24 NOTE — Progress Notes (Signed)
Recreation Therapy Notes ? ?Date: 02/24/2022 ? ?Time: 10:30 am   ? ?Location: Craft room   ? ?Behavioral response: Appropriate ? ?Intervention Topic: Self-care  ? ?Discussion/Intervention:  ?Group content today was focused on Self-Care. The group defined self-care and some positive ways they care for themselves. Individuals expressed ways and reasons why they neglected any self-care in the past. Patients described ways to improve self-care in the future. The group explained what could happen if they did not do any self-care activities at all. The group participated in the intervention ?self-care assessment? where they had a chance to discover some of their weaknesses and strengths in self- care. Patient came up with a self-care plan to improve themselves in the future.  ?Clinical Observations/Feedback: ?Patient came to group late due to unknown reasons. Individual was social with peers and staff while participating in the intervention.    ?Misty Howell LRT/CTRS  ? ? ? ? ? ? ? ?Misty Howell ?02/24/2022 11:53 AM ?

## 2022-02-24 NOTE — Progress Notes (Signed)
[facility] MD Progress Note ? ?02/24/2022 1:40 PM ?Misty Howell  ?MRN:  [id number] ?Subjective: Patient seen for follow-up.  She continues to describe herself as "afraid" and talks about feeling like everyone in the world or at least everyone she knows is laughing at her.  She reports having been afraid to could fly than her own mother last night.  Nevertheless she was not tearful today and seemed a little less anxious.  Tolerating Seroquel and sleeping fine. ?Principal Problem: Bipolar 1 disorder, mixed, severe ([place]) ?Diagnosis: Principal Problem: ?  Bipolar 1 disorder, mixed, severe ([place]) ?Active Problems: ?  Vitamin D deficiency ?  Seizure disorder ([place]) ? ?Total Time spent with patient: 30 minutes ? ?Past Psychiatric History: Past history of anxiety and depression and probable bipolar disorder ? ?Past Medical History:  ?Past Medical History:  ?Diagnosis Date  ? Anxiety   ? Cancer [facility])   ? Giant cell tumor left fifth finger.  ? Hypercholesterolemia   ? Overweight(278.02)   ? Seizures ([place])   ? Syncope and collapse   ? Vision abnormalities   ? Vitamin D deficiency   ?  ?Past Surgical History:  ?Procedure Laterality Date  ? CESAREAN SECTION    ? FINGER SURGERY    ? due to giant cell tumor, hip bone placed in finger  ? ?Family History:  ?Family History  ?Problem Relation Age of Onset  ? Hypertension Mother   ? Migraines Mother   ? Hypertension Father   ? ?Family Psychiatric  History: See previous ?Social History:  ?Social History  ? ?Substance and Sexual Activity  ?Alcohol Use Not Currently  ? Comment: social  ?   ?Social History  ? ?Substance and Sexual Activity  ?Drug Use Yes  ? Types: Marijuana  ? Comment: Pt stated she used Delta 8 a couple weeks ago  ?  ?Social History  ? ?Socioeconomic History  ? Marital status: Legally Separated  ?  Spouse name: Not on file  ? Number of children: Not on file  ? Years of education: Not on file  ? Highest education level: Not on file  ?Occupational History  ? Occupation: Finance   ?Tobacco Use  ? Smoking status: Former  ?  Packs/day: 0.00  ?  Years: 3.00  ?  Pack years: 0.00  ?  Types: Cigarettes  ?  Quit date: 01/20/1989  ?  Years since quitting: 33.1  ? Smokeless tobacco: Never  ?Vaping Use  ? Vaping Use: Never used  ?Substance and Sexual Activity  ? Alcohol use: Not Currently  ?  Comment: social  ? Drug use: Yes  ?  Types: Marijuana  ?  Comment: Pt stated she used Delta 8 a couple weeks ago  ? Sexual activity: Not Currently  ?Other Topics Concern  ? Not on file  ?Social History Narrative  ? Pt lives in [place] with mother and two teenaged sons.  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  Not followed by an outpatient psych provider.  ? ?Social Determinants of Health  ? ?Financial Resource Strain: Not on file  ?Food Insecurity: Not on file  ?Transportation Needs: Not on file  ?Physical Activity: Not on file  ?Stress: Not on file  ?Social Connections: Not on file  ? ?Additional Social History:  ?  ?  ?  ?  ?  ?  ?  ?  ?  ?  ?  ? ?Sleep: Fair ? ?Appetite:  Fair ? ?Current Medications: ?Current Facility-Administered Medications  ?Medication Dose Route Frequency  Provider Last Rate Last Admin  ? acetaminophen (TYLENOL) tablet 650 mg  650 mg Oral Q6H PRN Tory Septer T, MD      ? alum & mag hydroxide-simeth (MAALOX/MYLANTA) 200-200-20 MG/5ML suspension 30 mL  30 mL Oral Q4H PRN Detron Carras T, MD      ? hydrOXYzine (ATARAX) tablet 50 mg  50 mg Oral TID PRN Faysal Fenoglio, Madie Reno, MD   50 mg at 02/22/22 2130  ? levETIRAcetam (KEPPRA) tablet 500 mg  500 mg Oral BID Lindora Alviar T, MD   500 mg at 02/24/22 1006  ? magnesium hydroxide (MILK OF MAGNESIA) suspension 30 mL  30 mL Oral Daily PRN Lamont Glasscock, Madie Reno, MD   30 mL at 02/23/22 1810  ? QUEtiapine (SEROQUEL) tablet 400 mg  400 mg Oral QHS Aralynn Brake T, MD      ? temazepam (RESTORIL) capsule 15 mg  15 mg Oral QHS PRN Dyron Kawano, Madie Reno, MD   15 mg at 02/20/22 2150  ? ? ?Lab Results: No results found for this or any previous visit (from the past 48  hour(s)). ? ?Blood Alcohol level:  ?Lab Results  ?Component Value Date  ? ETH <10 02/18/2022  ? ETH <10 01/03/2022  ? ? ?Metabolic Disorder Labs: ?Lab Results  ?Component Value Date  ? HGBA1C 5.8 (H) 02/21/2022  ? MPG 119.76 02/21/2022  ? MPG 114.02 01/07/2022  ? ?No results found for: PROLACTIN ?Lab Results  ?Component Value Date  ? CHOL 138 02/21/2022  ? TRIG 172 (H) 02/21/2022  ? HDL 42 02/21/2022  ? CHOLHDL 3.3 02/21/2022  ? VLDL 34 02/21/2022  ? [place] 62 02/21/2022  ? [place] 74 01/07/2022  ? ? ?Physical Findings: ?AIMS:  , ,  ,  ,    ?CIWA:    ?COWS:    ? ?Musculoskeletal: ?Strength & Muscle Tone: within normal limits ?Gait & Station: normal ?Patient leans: N/A ? ?Psychiatric Specialty Exam: ? ?Presentation  ?General Appearance: Appropriate for Environment ? ?Eye Contact:Good ? ?Speech:Clear and Coherent; Normal Rate ? ?Speech Volume:Normal ? ?Handedness:Right ? ? ?Mood and Affect  ?Mood:Anxious ? ?Affect:Congruent ? ? ?Thought Process  ?Thought Processes:Coherent; Linear ? ?Descriptions of Associations:Circumstantial ? ?Orientation:Full (Time, Place and Person) ? ?Thought Content:Paranoid Ideation ? ?History of Schizophrenia/Schizoaffective disorder:No ? ?Duration of Psychotic Symptoms:N/A ? ?Hallucinations:No data recorded ?Ideas of Reference:None ? ?Suicidal Thoughts:No data recorded ?Homicidal Thoughts:No data recorded ? ?Sensorium  ?Memory:Immediate Fair; Recent Fair ? ?Judgment:Fair ? ?Insight:Present ? ? ?Executive Functions  ?Concentration:Fair ? ?Attention Span:Fair ? ?Recall:Fair ? ?[place] ? ?Language:Good ? ? ?Psychomotor Activity  ?Psychomotor Activity:No data recorded ? ?Assets  ?Assets:Communication Skills; Desire for Improvement; Financial Resources/Insurance; Housing; Social Support ? ? ?Sleep  ?Sleep:No data recorded ? ? ?Physical Exam: ?Physical Exam ?Vitals and nursing note reviewed.  ?Constitutional:   ?   Appearance: Normal appearance.  ?HENT:  ?   Head: Normocephalic and  atraumatic.  ?   Mouth/Throat:  ?   Pharynx: Oropharynx is clear.  ?Eyes:  ?   Pupils: Pupils are equal, round, and reactive to light.  ?Cardiovascular:  ?   Rate and Rhythm: Normal rate and regular rhythm.  ?Pulmonary:  ?   Effort: Pulmonary effort is normal.  ?   Breath sounds: Normal breath sounds.  ?Abdominal:  ?   General: Abdomen is flat.  ?   Palpations: Abdomen is soft.  ?Musculoskeletal:     ?   General: Normal range of motion.  ?Skin: ?   General:  Skin is warm and dry.  ?Neurological:  ?   General: No focal deficit present.  ?   Mental Status: She is alert. Mental status is at baseline.  ?Psychiatric:     ?   Attention and Perception: Attention normal.     ?   Mood and Affect: Mood is anxious.     ?   Speech: Speech is delayed.     ?   Behavior: Behavior is slowed.     ?   Thought Content: Thought content is paranoid. Thought content does not include homicidal or suicidal ideation.  ? ?Review of Systems  ?Constitutional: Negative.   ?HENT: Negative.    ?Eyes: Negative.   ?Respiratory: Negative.    ?Cardiovascular: Negative.   ?Gastrointestinal: Negative.   ?Musculoskeletal: Negative.   ?Skin: Negative.   ?Neurological: Negative.   ?Psychiatric/Behavioral:  Negative for suicidal ideas. The patient is nervous/anxious.   ?Blood pressure 129/89, pulse (!) 103, temperature 97.9 ?F (36.6 ?C), temperature source Oral, resp. rate 18, height '5\' 4"'$  (1.626 m), weight 86.2 kg, SpO2 97 %. Body mass index is 32.61 kg/m?. ? ? ?Treatment Plan Summary: ?Medication management and Plan increase Seroquel to 400 mg since she is tolerating it so well.  Encourage group attendance.  Supportive counseling and therapy and continue to try and reshape her anxiety when possible. ? ?Alethia Berthold, MD ?02/24/2022, 1:40 PM ? ?

## 2022-02-24 NOTE — Progress Notes (Signed)
Patient was cooperative with treatment on shift. She denies SI, HI & AVH and spent most of her time resting in be quietly. She needed redirection on on shift early in the morning due to confusion. No behavioral issues to report on shift at this time.  ?  ?  ?  ? ? ?

## 2022-02-25 DIAGNOSIS — F3163 Bipolar disorder, current episode mixed, severe, without psychotic features: Secondary | ICD-10-CM | POA: Diagnosis not present

## 2022-02-25 NOTE — Progress Notes (Signed)
Patient stated that she enjoyed going outside to the courtyard this morning. Patient also stated to this writer that overall, "I'm feeling a little better". Patient remains safe on the unit. ?

## 2022-02-25 NOTE — Progress Notes (Signed)
Recreation Therapy Notes ? ?Date: 02/25/2022 ? ?Time: 10:05 am   ? ?Location: Courtyard    ? ?Behavioral response: Appropriate ? ?Intervention Topic: Leisure   ? ?Discussion/Intervention:  ?Group content today was focused on leisure. The group defined what leisure is and some positive leisure activities they participate in. Individuals identified the difference between good and bad leisure. Participants expressed how they feel after participating in the leisure of their choice. The group discussed how they go about picking a leisure activity and if others are involved in their leisure activities. The patient stated how many leisure activities they have to choose from and reasons why it is important to have leisure time. Individuals participated in the intervention ?Exploration of Leisure? where they had a chance to identify new leisure activities as well as benefits of leisure. ?Clinical Observations/Feedback: ?Patient came to group and was able to explore participate in many leisure activities. Participant was able to identify leisure activities they enjoy outside of the hospital. Individual was social with peers and staff while participating in the intervention.    ?Babe Clenney LRT/CTRS  ? ? ? ? ? ? ? ? ? ? ? ?Misty Howell ?02/25/2022 11:40 AM ?

## 2022-02-25 NOTE — Group Note (Signed)
LCSW Group Therapy Note ? ?Group Date: 02/25/2022 ?Start Time: 1300 ?End Time: 1400 ? ? ?Type of Therapy and Topic:  Group Therapy - How To Cope with Nervousness about Discharge  ? ?Participation Level:  Active  ? ?Description of Group ?This process group involved identification of patients' feelings about discharge. Some of them are scheduled to be discharged soon, while others are new admissions, but each of them was asked to share thoughts and feelings surrounding discharge from the hospital. One common theme was that they are excited at the prospect of going home, while another was that many of them are apprehensive about sharing why they were hospitalized. Patients were given the opportunity to discuss these feelings with their peers in preparation for discharge. ? ?Therapeutic Goals ? ?Patient will identify their overall feelings about pending discharge. ?Patient will think about how they might proactively address issues that they believe will once again arise once they get home (i.e. with parents). ?Patients will participate in discussion about having hope for change. ? ? ?Summary of Patient Progress:  Patient was present for the entirety of the group session. Patient was an active listener and participated in the topic of discussion, provided helpful advice to others, and added nuance to topic of conversation. Patient shared examples of how patients can contribute to their discharge such as planning with family and scheduling outpatient appointments.  ? ? ?Therapeutic Modalities ?Cognitive Behavioral Therapy ? ? ?[name], LCSWA ?02/25/2022  2:40 PM   ?

## 2022-02-25 NOTE — Plan of Care (Signed)
D- Patient alert and oriented. Patient presented in a pleasant mood on assessment reporting that she slept "really good" last night. She felt as if the medication she receives at bedtime "helped me out a lot. I didn't have any nightmares". Patient endorsed depression, anxiety, and hopelessness on her self-inventory. Patient stated that "I feel like everybody's laughing at me because of the mistakes that I've made in the past". Patient also stated that "here, I feel safe, but I have more anxiety out in the community. I don't know who to trust as far as being in a safe place. My ex-husband hates me and he's friends with a lot of people". Patient mentioned that she wants to go to grief counseling, because she lost one of her sons fifteen months ago, and she would also like for her younger son to attend as well.  Patient denied SI, HI, AVH, and pain at this time. Patient's goal for today is "to be a better role model for my son, to make sure my son has a great, successful, bright future", in which she will "go to group meetings, pray, read my Bible, talk to counselors", in order to achieve her goal. ? ?A- Scheduled medications administered to patient, per MD orders. Support and encouragement provided.  Routine safety checks conducted every 15 minutes.  Patient informed to notify staff with problems or concerns. ? ?R- No adverse drug reactions noted. Patient contracts for safety at this time. Patient compliant with medications and treatment plan. Patient receptive, calm, and cooperative. Patient interacts well with others on the unit.  Patient remains safe at this time. ? ?Problem: Education: ?Goal: Knowledge of [place] [facility] Education information/materials will improve ?Outcome: Progressing ?Goal: Emotional status will improve ?Outcome: Progressing ?Goal: Mental status will improve ?Outcome: Progressing ?Goal: Verbalization of understanding the information provided will improve ?Outcome: Progressing ?  ?Problem:  Health Behavior/Discharge Planning: ?Goal: Compliance with treatment plan for underlying cause of condition will improve ?Outcome: Progressing ?  ?Problem: Safety: ?Goal: Periods of time without injury will increase ?Outcome: Progressing ?  ?Problem: Coping: ?Goal: Coping ability will improve ?Outcome: Progressing ?Goal: Will verbalize feelings ?Outcome: Progressing ?  ?Problem: Self-Concept: ?Goal: Level of anxiety will decrease ?Outcome: Progressing ?  ?Problem: Self-Concept: ?Goal: Ability to identify factors that promote anxiety will improve ?Outcome: Progressing ?Goal: Level of anxiety will decrease ?Outcome: Progressing ?  ?Problem: Coping: ?Goal: Ability to identify and develop effective coping behavior will improve ?Outcome: Progressing ?  ?Problem: Self-Concept: ?Goal: Level of anxiety will decrease ?Outcome: Progressing ?  ?Problem: Coping: ?Goal: Coping ability will improve ?Outcome: Progressing ?Goal: Will verbalize feelings ?Outcome: Progressing ?  ?Problem: Health Behavior/Discharge Planning: ?Goal: Ability to make decisions will improve ?Outcome: Progressing ?Goal: Compliance with therapeutic regimen will improve ?Outcome: Progressing ?  ?

## 2022-02-25 NOTE — Progress Notes (Signed)
[facility] MD Progress Note ? ?02/25/2022 10:45 AM ?Misty Howell  ?MRN:  [id number] ?Subjective: Follow-up 56 year old woman presented with psychosis and paranoia.  On interview today the patient says she is feeling a little better.  She slept well last night.  Had a brief awakening around 3:00 but went back to sleep.  She says she still feels worried and she still shows signs of paranoia talking about how she is "in a lot of trouble".  She denies however having any suicidal thoughts and says she feels less frightened than she did the past couple days. ?Principal Problem: Bipolar 1 disorder, mixed, severe ([place]) ?Diagnosis: Principal Problem: ?  Bipolar 1 disorder, mixed, severe ([place]) ?Active Problems: ?  Vitamin D deficiency ?  Seizure disorder ([place]) ? ?Total Time spent with patient: 30 minutes ? ?Past Psychiatric History: History of depression possible bipolar disorder ? ?Past Medical History:  ?Past Medical History:  ?Diagnosis Date  ? Anxiety   ? Cancer [facility])   ? Giant cell tumor left fifth finger.  ? Hypercholesterolemia   ? Overweight(278.02)   ? Seizures ([place])   ? Syncope and collapse   ? Vision abnormalities   ? Vitamin D deficiency   ?  ?Past Surgical History:  ?Procedure Laterality Date  ? CESAREAN SECTION    ? FINGER SURGERY    ? due to giant cell tumor, hip bone placed in finger  ? ?Family History:  ?Family History  ?Problem Relation Age of Onset  ? Hypertension Mother   ? Migraines Mother   ? Hypertension Father   ? ?Family Psychiatric  History: See previous ?Social History:  ?Social History  ? ?Substance and Sexual Activity  ?Alcohol Use Not Currently  ? Comment: social  ?   ?Social History  ? ?Substance and Sexual Activity  ?Drug Use Yes  ? Types: Marijuana  ? Comment: Pt stated she used Delta 8 a couple weeks ago  ?  ?Social History  ? ?Socioeconomic History  ? Marital status: Legally Separated  ?  Spouse name: Not on file  ? Number of children: Not on file  ? Years of education: Not on file  ? Highest  education level: Not on file  ?Occupational History  ? Occupation: Finance  ?Tobacco Use  ? Smoking status: Former  ?  Packs/day: 0.00  ?  Years: 3.00  ?  Pack years: 0.00  ?  Types: Cigarettes  ?  Quit date: 01/20/1989  ?  Years since quitting: 33.1  ? Smokeless tobacco: Never  ?Vaping Use  ? Vaping Use: Never used  ?Substance and Sexual Activity  ? Alcohol use: Not Currently  ?  Comment: social  ? Drug use: Yes  ?  Types: Marijuana  ?  Comment: Pt stated she used Delta 8 a couple weeks ago  ? Sexual activity: Not Currently  ?Other Topics Concern  ? Not on file  ?Social History Narrative  ? Pt lives in [place] with mother and two teenaged sons.  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  Not followed by an outpatient psych provider.  ? ?Social Determinants of Health  ? ?Financial Resource Strain: Not on file  ?Food Insecurity: Not on file  ?Transportation Needs: Not on file  ?Physical Activity: Not on file  ?Stress: Not on file  ?Social Connections: Not on file  ? ?Additional Social History:  ?  ?  ?  ?  ?  ?  ?  ?  ?  ?  ?  ? ?Sleep: Fair ? ?  Appetite:  Fair ? ?Current Medications: ?Current Facility-Administered Medications  ?Medication Dose Route Frequency Provider Last Rate Last Admin  ? acetaminophen (TYLENOL) tablet 650 mg  650 mg Oral Q6H PRN Symphani Eckstrom T, MD      ? alum & mag hydroxide-simeth (MAALOX/MYLANTA) 200-200-20 MG/5ML suspension 30 mL  30 mL Oral Q4H PRN Alannah Averhart T, MD      ? hydrOXYzine (ATARAX) tablet 50 mg  50 mg Oral TID PRN Shaniah Baltes, Madie Reno, MD   50 mg at 02/22/22 2130  ? levETIRAcetam (KEPPRA) tablet 500 mg  500 mg Oral BID Dorean Hiebert T, MD   500 mg at 02/24/22 2120  ? magnesium hydroxide (MILK OF MAGNESIA) suspension 30 mL  30 mL Oral Daily PRN Nohemi Nicklaus, Madie Reno, MD   30 mL at 02/23/22 1810  ? QUEtiapine (SEROQUEL) tablet 400 mg  400 mg Oral QHS Lindey Renzulli T, MD   400 mg at 02/24/22 2119  ? temazepam (RESTORIL) capsule 15 mg  15 mg Oral QHS PRN Minetta Krisher, Madie Reno, MD   15 mg at 02/24/22 2119   ? ? ?Lab Results: No results found for this or any previous visit (from the past 48 hour(s)). ? ?Blood Alcohol level:  ?Lab Results  ?Component Value Date  ? ETH <10 02/18/2022  ? ETH <10 01/03/2022  ? ? ?Metabolic Disorder Labs: ?Lab Results  ?Component Value Date  ? HGBA1C 5.8 (H) 02/21/2022  ? MPG 119.76 02/21/2022  ? MPG 114.02 01/07/2022  ? ?No results found for: PROLACTIN ?Lab Results  ?Component Value Date  ? CHOL 138 02/21/2022  ? TRIG 172 (H) 02/21/2022  ? HDL 42 02/21/2022  ? CHOLHDL 3.3 02/21/2022  ? VLDL 34 02/21/2022  ? [place] 62 02/21/2022  ? [place] 74 01/07/2022  ? ? ?Physical Findings: ?AIMS:  , ,  ,  ,    ?CIWA:    ?COWS:    ? ?Musculoskeletal: ?Strength & Muscle Tone: within normal limits ?Gait & Station: normal ?Patient leans: N/A ? ?Psychiatric Specialty Exam: ? ?Presentation  ?General Appearance: Appropriate for Environment ? ?Eye Contact:Good ? ?Speech:Clear and Coherent; Normal Rate ? ?Speech Volume:Normal ? ?Handedness:Right ? ? ?Mood and Affect  ?Mood:Anxious ? ?Affect:Congruent ? ? ?Thought Process  ?Thought Processes:Coherent; Linear ? ?Descriptions of Associations:Circumstantial ? ?Orientation:Full (Time, Place and Person) ? ?Thought Content:Paranoid Ideation ? ?History of Schizophrenia/Schizoaffective disorder:No ? ?Duration of Psychotic Symptoms:N/A ? ?Hallucinations:No data recorded ?Ideas of Reference:None ? ?Suicidal Thoughts:No data recorded ?Homicidal Thoughts:No data recorded ? ?Sensorium  ?Memory:Immediate Fair; Recent Fair ? ?Judgment:Fair ? ?Insight:Present ? ? ?Executive Functions  ?Concentration:Fair ? ?Attention Span:Fair ? ?Recall:Fair ? ?[place] ? ?Language:Good ? ? ?Psychomotor Activity  ?Psychomotor Activity:No data recorded ? ?Assets  ?Assets:Communication Skills; Desire for Improvement; Financial Resources/Insurance; Housing; Social Support ? ? ?Sleep  ?Sleep:No data recorded ? ? ?Physical Exam: ?Physical Exam ?Vitals and nursing note reviewed.   ?Constitutional:   ?   Appearance: Normal appearance.  ?HENT:  ?   Head: Normocephalic and atraumatic.  ?   Mouth/Throat:  ?   Pharynx: Oropharynx is clear.  ?Eyes:  ?   Pupils: Pupils are equal, round, and reactive to light.  ?Cardiovascular:  ?   Rate and Rhythm: Normal rate and regular rhythm.  ?Pulmonary:  ?   Effort: Pulmonary effort is normal.  ?   Breath sounds: Normal breath sounds.  ?Abdominal:  ?   General: Abdomen is flat.  ?   Palpations: Abdomen is soft.  ?Musculoskeletal:     ?  General: Normal range of motion.  ?Skin: ?   General: Skin is warm and dry.  ?Neurological:  ?   General: No focal deficit present.  ?   Mental Status: She is alert. Mental status is at baseline.  ?Psychiatric:     ?   Attention and Perception: Attention normal.     ?   Mood and Affect: Mood is anxious.     ?   Speech: Speech normal.     ?   Behavior: Behavior is slowed.     ?   Thought Content: Thought content is paranoid. Thought content does not include homicidal or suicidal ideation.     ?   Cognition and Memory: Cognition is impaired.  ? ?Review of Systems  ?Constitutional: Negative.   ?HENT: Negative.    ?Eyes: Negative.   ?Respiratory: Negative.    ?Cardiovascular: Negative.   ?Gastrointestinal: Negative.   ?Musculoskeletal: Negative.   ?Skin: Negative.   ?Neurological: Negative.   ?Psychiatric/Behavioral:  Negative for depression, hallucinations, memory loss, substance abuse and suicidal ideas. The patient is nervous/anxious. The patient does not have insomnia.   ?Blood pressure 119/89, pulse 95, temperature (!) 97.5 ?F (36.4 ?C), temperature source Oral, resp. rate 18, height '5\' 4"'$  (1.626 m), weight 86.2 kg, SpO2 100 %. Body mass index is 32.61 kg/m?. ? ? ?Treatment Plan Summary: ?Daily contact with patient to assess and evaluate symptoms and progress in treatment, Medication management, and Plan she is now on Seroquel 400 mg at night and we will continue at that dosage for now without increasing since she is  starting to show some improvement.  Explained medication and treatment plan to patient.  Encouraged her to be out of her room interacting going to groups. ? ?Alethia Berthold, MD ?02/25/2022, 10:45 AM ? ?
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
## 2022-02-26 DIAGNOSIS — F3163 Bipolar disorder, current episode mixed, severe, without psychotic features: Secondary | ICD-10-CM | POA: Diagnosis not present

## 2022-02-26 NOTE — Progress Notes (Signed)
Recreation Therapy Notes ? ? ?Date: 02/26/2022 ? ?Time: 10:50 am    ? ?Location: Craft room    ? ?Behavioral response: Appropriate ? ?Intervention Topic: Self-esteem   ? ?Discussion/Intervention:  ?Group content today was focused on self-esteem. Patient defined self-esteem and where it comes form. The group described reasons self-esteem is important. Individuals stated things that impact self-esteem and positive ways to improve self-esteem. The group participated in the intervention ?Collage of Me? where patients were able to create a collage of positive things that makes them who they are. ?Clinical Observations/Feedback: ?Patient came to group and defined self-esteem as feelings about self. She stated that self-esteem comes from life choices, experiences, and positive/negative thinking. Participant stated that she is working her self-esteem by walking, positive affirmations and starting each day fresh. Individual was social with peers and staff while participating in the intervention.    ?Jahara Dail LRT/CTRS  ? ? ? ? ? ? ? ?Jamey Demchak ?02/26/2022 12:03 PM ?

## 2022-02-26 NOTE — Progress Notes (Signed)
Pt awake and asking for something to drink. Pt told she could have water to drink. Pt walked back to her room because she said she was a little dizzy from getting up too quickly. Pt safely back in room. ?

## 2022-02-26 NOTE — Plan of Care (Signed)
?  Problem: Health Behavior/Discharge Planning: ?Goal: Compliance with treatment plan for underlying cause of condition will improve ?Outcome: Progressing ?  ?Problem: Safety: ?Goal: Periods of time without injury will increase ?Outcome: Progressing ?  ?Problem: Coping: ?Goal: Will verbalize feelings ?Outcome: Progressing ?  ?Problem: Self-Concept: ?Goal: Level of anxiety will decrease ?Outcome: Progressing ?  ?Problem: Self-Concept: ?Goal: Ability to identify factors that promote anxiety will improve ?Outcome: Progressing ?  ?Problem: Coping: ?Goal: Coping ability will improve ?Outcome: Progressing ?  ?Problem: Health Behavior/Discharge Planning: ?Goal: Compliance with therapeutic regimen will improve ?Outcome: Progressing ?  ?

## 2022-02-26 NOTE — BH IP Treatment Plan (Signed)
Interdisciplinary Treatment and Diagnostic Plan Update ? ?02/26/2022 ?Time of Session: 8657 ?Misty Howell ?MRN: [id number] ? ?Principal Diagnosis: Bipolar 1 disorder, mixed, severe ([place]) ? ?Secondary Diagnoses: Principal Problem: ?  Bipolar 1 disorder, mixed, severe ([place]) ?Active Problems: ?  Vitamin D deficiency ?  Seizure disorder ([place]) ? ? ?Current Medications:  ?Current Facility-Administered Medications  ?Medication Dose Route Frequency Provider Last Rate Last Admin  ? acetaminophen (TYLENOL) tablet 650 mg  650 mg Oral Q6H PRN Clapacs, John T, MD      ? alum & mag hydroxide-simeth (MAALOX/MYLANTA) 200-200-20 MG/5ML suspension 30 mL  30 mL Oral Q4H PRN Clapacs, John T, MD      ? hydrOXYzine (ATARAX) tablet 50 mg  50 mg Oral TID PRN Clapacs, Madie Reno, MD   50 mg at 02/22/22 2130  ? levETIRAcetam (KEPPRA) tablet 500 mg  500 mg Oral BID Clapacs, John T, MD   500 mg at 02/25/22 2108  ? magnesium hydroxide (MILK OF MAGNESIA) suspension 30 mL  30 mL Oral Daily PRN Clapacs, Madie Reno, MD   30 mL at 02/23/22 1810  ? QUEtiapine (SEROQUEL) tablet 400 mg  400 mg Oral QHS Clapacs, John T, MD   400 mg at 02/25/22 2107  ? temazepam (RESTORIL) capsule 15 mg  15 mg Oral QHS PRN Clapacs, Madie Reno, MD   15 mg at 02/25/22 2108  ? ?PTA Medications: ?Medications Prior to Admission  ?Medication Sig Dispense Refill Last Dose  ? divalproex (DEPAKOTE SPRINKLE) 125 MG capsule Take 4 capsules (500 mg total) by mouth every 12 (twelve) hours. (Patient not taking: Reported on 02/19/2022) 240 capsule 0   ? haloperidol (HALDOL) 5 MG tablet Take 1 tablet (5 mg total) by mouth 2 (two) times daily. (Patient not taking: Reported on 02/19/2022) 60 tablet 0   ? hydrOXYzine (ATARAX) 25 MG tablet Take 1 tablet (25 mg total) by mouth 3 (three) times daily as needed for anxiety. (Patient not taking: Reported on 02/19/2022) 30 tablet 0   ? levETIRAcetam (KEPPRA) 500 MG tablet Take 1 tablet (500 mg total) by mouth 2 (two) times daily. 60 tablet 0   ? lidocaine  (LIDODERM) 5 % Place 1 patch onto the skin daily. Remove & Discard patch within 12 hours or as directed by MD (Patient not taking: Reported on 01/04/2022) 30 patch 0   ? lithium carbonate (ESKALITH) 450 MG CR tablet Take 1 tablet (450 mg total) by mouth every 12 (twelve) hours. (Patient not taking: Reported on 02/19/2022) 60 tablet 0   ? OLANZapine zydis (ZYPREXA) 10 MG disintegrating tablet Take 1 tablet (10 mg total) by mouth 2 (two) times daily. (Patient not taking: Reported on 02/19/2022) 60 tablet 0   ? propranolol ER (INDERAL LA) 60 MG 24 hr capsule Take 1 capsule (60 mg total) by mouth daily. (Patient not taking: Reported on 02/19/2022) 30 capsule 0   ? traZODone (DESYREL) 50 MG tablet Take 1 tablet (50 mg total) by mouth at bedtime as needed for sleep. (Patient not taking: Reported on 02/19/2022) 30 tablet 0   ? Vitamin D, Ergocalciferol, (DRISDOL) 1.25 MG (50000 UNIT) CAPS capsule Take 1 capsule (50,000 Units total) by mouth every 7 (seven) days. (Patient not taking: Reported on 02/19/2022) 5 capsule 0   ? ? ?Patient Stressors: Financial difficulties   ?Legal issue   ?Medication change or noncompliance   ? ?Patient Strengths: Ability for insight  ?Communication skills  ?Motivation for treatment/growth  ?Supportive family/friends  ? ?Treatment Modalities: Medication  Management, Group therapy, Case management,  ?1 to 1 session with clinician, Psychoeducation, Recreational therapy. ? ? ?Physician Treatment Plan for Primary Diagnosis: Bipolar 1 disorder, mixed, severe ([place]) ?Long Term Goal(s): Improvement in symptoms so as ready for discharge  ? ?Short Term Goals: Compliance with prescribed medications will improve ?Ability to verbalize feelings will improve ?Ability to demonstrate self-control will improve ?Ability to identify and develop effective coping behaviors will improve ? ?Medication Management: Evaluate patient's response, side effects, and tolerance of medication regimen. ? ?Therapeutic Interventions: 1 to  1 sessions, Unit Group sessions and Medication administration. ? ?Evaluation of Outcomes: Not Met ? ?Physician Treatment Plan for Secondary Diagnosis: Principal Problem: ?  Bipolar 1 disorder, mixed, severe ([place]) ?Active Problems: ?  Vitamin D deficiency ?  Seizure disorder ([place]) ? ?Long Term Goal(s): Improvement in symptoms so as ready for discharge  ? ?Short Term Goals: Compliance with prescribed medications will improve ?Ability to verbalize feelings will improve ?Ability to demonstrate self-control will improve ?Ability to identify and develop effective coping behaviors will improve    ? ?Medication Management: Evaluate patient's response, side effects, and tolerance of medication regimen. ? ?Therapeutic Interventions: 1 to 1 sessions, Unit Group sessions and Medication administration. ? ?Evaluation of Outcomes: Not Met ? ? ?RN Treatment Plan for Primary Diagnosis: Bipolar 1 disorder, mixed, severe ([place]) ?Long Term Goal(s): Knowledge of disease and therapeutic regimen to maintain health will improve ? ?Short Term Goals: Ability to remain free from injury will improve, Ability to verbalize frustration and anger appropriately will improve, Ability to demonstrate self-control, Ability to participate in decision making will improve, Ability to verbalize feelings will improve, Ability to disclose and discuss suicidal ideas, Ability to identify and develop effective coping behaviors will improve, and Compliance with prescribed medications will improve ? ?Medication Management: RN will administer medications as ordered by provider, will assess and evaluate patient's response and provide education to patient for prescribed medication. RN will report any adverse and/or side effects to prescribing provider. ? ?Therapeutic Interventions: 1 on 1 counseling sessions, Psychoeducation, Medication administration, Evaluate responses to treatment, Monitor vital signs and CBGs as ordered, Perform/monitor CIWA, COWS, AIMS and Fall  Risk screenings as ordered, Perform wound care treatments as ordered. ? ?Evaluation of Outcomes: Not Met ? ? ?LCSW Treatment Plan for Primary Diagnosis: Bipolar 1 disorder, mixed, severe ([place]) ?Long Term Goal(s): Safe transition to appropriate next level of care at discharge, Engage patient in therapeutic group addressing interpersonal concerns. ? ?Short Term Goals: Engage patient in aftercare planning with referrals and resources, Increase social support, Increase ability to appropriately verbalize feelings, Increase emotional regulation, Facilitate acceptance of mental health diagnosis and concerns, Facilitate patient progression through stages of change regarding substance use diagnoses and concerns, Identify triggers associated with mental health/substance abuse issues, and Increase skills for wellness and recovery ? ?Therapeutic Interventions: Assess for all discharge needs, 1 to 1 time with Education officer, museum, Explore available resources and support systems, Assess for adequacy in community support network, Educate family and significant other(s) on suicide prevention, Complete Psychosocial Assessment, Interpersonal group therapy. ? ?Evaluation of Outcomes: Not Met ? ? ?Progress in Treatment: ?Attending groups: No. ?Participating in groups: No. ?Taking medication as prescribed: Yes. ?Toleration medication: Yes. ?Family/Significant other contact made: Yes, individual(s) contacted:  [name], sister.  ?Patient understands diagnosis: No. ?Discussing patient identified problems/goals with staff: Yes. ?Medical problems stabilized or resolved: Yes. ?Denies suicidal/homicidal ideation: Yes. ?Issues/concerns per patient self-inventory: Yes. ?Other: none  ? ?New problem(s) identified: No, Describe:  No  additional problems/concerns identified at this time. Patient continues to exhibit hypomanic symptoms,   disorganized behaviors/thoughts, and lacks orientation to situation.  ? ?New Short Term/Long Term Goal(s): Patient  to work towards elimination of symptoms of psychosis, medication management for mood stabilization; development of comprehensive mental wellness plan. ? ?Patient Goals:  No additional goals identified at

## 2022-02-26 NOTE — Plan of Care (Signed)
?  Problem: Education: ?Goal: Knowledge of [facility] Education information/materials will improve ?Outcome: Progressing ?Goal: Verbalization of understanding the information provided will improve ?Outcome: Progressing ?  ?Problem: Health Behavior/Discharge Planning: ?Goal: Compliance with treatment plan for underlying cause of condition will improve ?Outcome: Progressing ?  ?Problem: Safety: ?Goal: Periods of time without injury will increase ?Outcome: Progressing ?  ?Problem: Coping: ?Goal: Will verbalize feelings ?Outcome: Progressing ?  ?Problem: Health Behavior/Discharge Planning: ?Goal: Compliance with therapeutic regimen will improve ?Outcome: Progressing ?  ?

## 2022-02-26 NOTE — Group Note (Signed)
[facility] LCSW Group Therapy Note ? ? ?Group Date: 02/26/2022 ?Start Time: 1330 ?End Time: 1430 ? ? ?Type of Therapy/Topic:  Group Therapy:  Emotion Regulation ? ?Participation Level:  Active  ? ? ? ?Description of Group:   ? The purpose of this group is to assist patients in learning to regulate negative emotions and experience positive emotions. Patients will be guided to discuss ways in which they have been vulnerable to their negative emotions. These vulnerabilities will be juxtaposed with experiences of positive emotions or situations, and patients challenged to use positive emotions to combat negative ones. Special emphasis will be placed on coping with negative emotions in conflict situations, and patients will process healthy conflict resolution skills. ? ?Therapeutic Goals: ?Patient will identify two positive emotions or experiences to reflect on in order to balance out negative emotions:  ?Patient will label two or more emotions that they find the most difficult to experience:  ?Patient will be able to demonstrate positive conflict resolution skills through discussion or role plays:  ? ?Summary of Patient Progress: ? ? ?Patient was present for the entirety of the group session. Patient was an active listener and participated in the topic of discussion, provided helpful advice to others, and added nuance to topic of conversation.   ? ? ? ?Therapeutic Modalities:   ?Cognitive Behavioral Therapy ?Feelings Identification ?Dialectical Behavioral Therapy ? ? ?Karalee Height, Student-Social Work ?

## 2022-02-26 NOTE — Progress Notes (Signed)
D: Patient alert and oriented. Patient denies pain. Patient rates anxiety and depression 8/10. Patient states this is because she is worried about her son. Patient denies SI/HI/AVH.  ? ?A: Scheduled medications administered to patient, per MD orders.  Support and encouragement provided to patient.  ?Q15 minute safety checks maintained.  ? ?R: Patient compliant with medication administration and treatment plan. No adverse drug reactions noted. Patient remains safe on the unit at this time.  ?

## 2022-02-26 NOTE — Progress Notes (Signed)
[facility] MD Progress Note ? ?02/26/2022 4:15 PM ?Misty Howell  ?MRN:  [id number] ?Subjective: Patient seen and chart reviewed.  Follow-up 56 year old woman having psychotic and depressed symptoms.  Patient reports today she is feeling significantly better.  She says she slept okay.  She says she is not feeling as frightened as she was.  She has been going outside interacting with other patients.  Tolerating medicine ?Principal Problem: Bipolar 1 disorder, mixed, severe ([place]) ?Diagnosis: Principal Problem: ?  Bipolar 1 disorder, mixed, severe ([place]) ?Active Problems: ?  Vitamin D deficiency ?  Seizure disorder ([place]) ? ?Total Time spent with patient: 30 minutes ? ?Past Psychiatric History: Past history of depression ? ?Past Medical History:  ?Past Medical History:  ?Diagnosis Date  ? Anxiety   ? Cancer [facility])   ? Giant cell tumor left fifth finger.  ? Hypercholesterolemia   ? Overweight(278.02)   ? Seizures ([place])   ? Syncope and collapse   ? Vision abnormalities   ? Vitamin D deficiency   ?  ?Past Surgical History:  ?Procedure Laterality Date  ? CESAREAN SECTION    ? FINGER SURGERY    ? due to giant cell tumor, hip bone placed in finger  ? ?Family History:  ?Family History  ?Problem Relation Age of Onset  ? Hypertension Mother   ? Migraines Mother   ? Hypertension Father   ? ?Family Psychiatric  History: See previous ?Social History:  ?Social History  ? ?Substance and Sexual Activity  ?Alcohol Use Not Currently  ? Comment: social  ?   ?Social History  ? ?Substance and Sexual Activity  ?Drug Use Yes  ? Types: Marijuana  ? Comment: Pt stated she used Delta 8 a couple weeks ago  ?  ?Social History  ? ?Socioeconomic History  ? Marital status: Legally Separated  ?  Spouse name: Not on file  ? Number of children: Not on file  ? Years of education: Not on file  ? Highest education level: Not on file  ?Occupational History  ? Occupation: Finance  ?Tobacco Use  ? Smoking status: Former  ?  Packs/day: 0.00  ?  Years: 3.00  ?  Pack  years: 0.00  ?  Types: Cigarettes  ?  Quit date: 01/20/1989  ?  Years since quitting: 33.1  ? Smokeless tobacco: Never  ?Vaping Use  ? Vaping Use: Never used  ?Substance and Sexual Activity  ? Alcohol use: Not Currently  ?  Comment: social  ? Drug use: Yes  ?  Types: Marijuana  ?  Comment: Pt stated she used Delta 8 a couple weeks ago  ? Sexual activity: Not Currently  ?Other Topics Concern  ? Not on file  ?Social History Narrative  ? Pt lives in [place] with mother and two teenaged sons.  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  Not followed by an outpatient psych provider.  ? ?Social Determinants of Health  ? ?Financial Resource Strain: Not on file  ?Food Insecurity: Not on file  ?Transportation Needs: Not on file  ?Physical Activity: Not on file  ?Stress: Not on file  ?Social Connections: Not on file  ? ?Additional Social History:  ?  ?  ?  ?  ?  ?  ?  ?  ?  ?  ?  ? ?Sleep: Fair ? ?Appetite:  Fair ? ?Current Medications: ?Current Facility-Administered Medications  ?Medication Dose Route Frequency Provider Last Rate Last Admin  ? acetaminophen (TYLENOL) tablet 650 mg  650 mg Oral Q6H  PRN Jacquette Canales, Madie Reno, MD      ? alum & mag hydroxide-simeth (MAALOX/MYLANTA) 200-200-20 MG/5ML suspension 30 mL  30 mL Oral Q4H PRN Kristine Tiley T, MD      ? hydrOXYzine (ATARAX) tablet 50 mg  50 mg Oral TID PRN Adilynne Fitzwater, Madie Reno, MD   50 mg at 02/22/22 2130  ? levETIRAcetam (KEPPRA) tablet 500 mg  500 mg Oral BID Lilymae Swiech, Madie Reno, MD   500 mg at 02/26/22 0940  ? magnesium hydroxide (MILK OF MAGNESIA) suspension 30 mL  30 mL Oral Daily PRN Christphor Groft, Madie Reno, MD   30 mL at 02/23/22 1810  ? QUEtiapine (SEROQUEL) tablet 400 mg  400 mg Oral QHS Zareth Rippetoe T, MD   400 mg at 02/25/22 2107  ? temazepam (RESTORIL) capsule 15 mg  15 mg Oral QHS PRN Nyaire Denbleyker, Madie Reno, MD   15 mg at 02/25/22 2108  ? ? ?Lab Results: No results found for this or any previous visit (from the past 48 hour(s)). ? ?Blood Alcohol level:  ?Lab Results  ?Component Value Date  ? ETH  <10 02/18/2022  ? ETH <10 01/03/2022  ? ? ?Metabolic Disorder Labs: ?Lab Results  ?Component Value Date  ? HGBA1C 5.8 (H) 02/21/2022  ? MPG 119.76 02/21/2022  ? MPG 114.02 01/07/2022  ? ?No results found for: PROLACTIN ?Lab Results  ?Component Value Date  ? CHOL 138 02/21/2022  ? TRIG 172 (H) 02/21/2022  ? HDL 42 02/21/2022  ? CHOLHDL 3.3 02/21/2022  ? VLDL 34 02/21/2022  ? [place] 62 02/21/2022  ? [place] 74 01/07/2022  ? ? ?Physical Findings: ?AIMS:  , ,  ,  ,    ?CIWA:    ?COWS:    ? ?Musculoskeletal: ?Strength & Muscle Tone: within normal limits ?Gait & Station: normal ?Patient leans: N/A ? ?Psychiatric Specialty Exam: ? ?Presentation  ?General Appearance: Appropriate for Environment ? ?Eye Contact:Good ? ?Speech:Clear and Coherent; Normal Rate ? ?Speech Volume:Normal ? ?Handedness:Right ? ? ?Mood and Affect  ?Mood:Anxious ? ?Affect:Congruent ? ? ?Thought Process  ?Thought Processes:Coherent; Linear ? ?Descriptions of Associations:Circumstantial ? ?Orientation:Full (Time, Place and Person) ? ?Thought Content:Paranoid Ideation ? ?History of Schizophrenia/Schizoaffective disorder:No ? ?Duration of Psychotic Symptoms:N/A ? ?Hallucinations:No data recorded ?Ideas of Reference:None ? ?Suicidal Thoughts:No data recorded ?Homicidal Thoughts:No data recorded ? ?Sensorium  ?Memory:Immediate Fair; Recent Fair ? ?Judgment:Fair ? ?Insight:Present ? ? ?Executive Functions  ?Concentration:Fair ? ?Attention Span:Fair ? ?Recall:Fair ? ?[place] ? ?Language:Good ? ? ?Psychomotor Activity  ?Psychomotor Activity:No data recorded ? ?Assets  ?Assets:Communication Skills; Desire for Improvement; Financial Resources/Insurance; Housing; Social Support ? ? ?Sleep  ?Sleep:No data recorded ? ? ?Physical Exam: ?Physical Exam ?Vitals and nursing note reviewed.  ?Constitutional:   ?   Appearance: Normal appearance.  ?HENT:  ?   Head: Normocephalic and atraumatic.  ?   Mouth/Throat:  ?   Pharynx: Oropharynx is clear.  ?Eyes:  ?    Pupils: Pupils are equal, round, and reactive to light.  ?Cardiovascular:  ?   Rate and Rhythm: Normal rate and regular rhythm.  ?Pulmonary:  ?   Effort: Pulmonary effort is normal.  ?   Breath sounds: Normal breath sounds.  ?Abdominal:  ?   General: Abdomen is flat.  ?   Palpations: Abdomen is soft.  ?Musculoskeletal:     ?   General: Normal range of motion.  ?Skin: ?   General: Skin is warm and dry.  ?Neurological:  ?   General: No focal  deficit present.  ?   Mental Status: She is alert. Mental status is at baseline.  ?Psychiatric:     ?   Attention and Perception: Attention normal.     ?   Mood and Affect: Mood normal.     ?   Speech: Speech normal.     ?   Behavior: Behavior is cooperative.     ?   Thought Content: Thought content normal.     ?   Cognition and Memory: Cognition normal.  ? ?Review of Systems  ?Constitutional: Negative.   ?HENT: Negative.    ?Eyes: Negative.   ?Respiratory: Negative.    ?Cardiovascular: Negative.   ?Gastrointestinal: Negative.   ?Musculoskeletal: Negative.   ?Skin: Negative.   ?Neurological: Negative.   ?Psychiatric/Behavioral: Negative.    ?Blood pressure 131/85, pulse (!) 105, temperature 97.7 ?F (36.5 ?C), temperature source Oral, resp. rate 18, height '5\' 4"'$  (1.626 m), weight 86.2 kg, SpO2 100 %. Body mass index is 32.61 kg/m?. ? ? ?Treatment Plan Summary: ?Medication management and Plan no change to Seroquel.  Encourage patient to be thinking seriously about her discharge plan and possibly calling relatives if that is what she needs to do.  Continue daily involvement in groups and therapeutic activities.  Possible discharge by the end of the week. ? ?Alethia Berthold, MD ?02/26/2022, 4:15 PM ? ?

## 2022-02-26 NOTE — Progress Notes (Signed)
?   02/26/22 2000  ?Psych Admission Type (Psych Patients Only)  ?Admission Status Involuntary  ?Psychosocial Assessment  ?Patient Complaints Anxiety;Worrying  ?Eye Contact Intense  ?Facial Expression Anxious;Wide-eyed  ?Affect Anxious  ?Speech Rapid;Logical/coherent;Pressured  ?Interaction Assertive;Needy  ?Motor Activity Slow  ?Appearance/Hygiene In scrubs;Unremarkable  ?Behavior Characteristics Cooperative;Appropriate to situation;Anxious  ?Mood Anxious;Sad;Pleasant  ?Thought Process  ?Coherency Concrete thinking  ?Content Blaming self;Blaming others;Paranoia  ?Delusions Paranoid  ?Perception WDL  ?Hallucination None reported or observed  ?Judgment Limited  ?Confusion None  ?Danger to Self  ?Current suicidal ideation? Denies  ?Danger to Others  ?Danger to Others None reported or observed  ? ?Pt seen in her room. Pt denies SI, HI, AVH and pain. Pt has rash that is red and dry developing in area near her left and right axillae. States that it burns on the left side. "It's probably from the deodorant here. I'm used to using dry solid. It's okay though." Pt rates anxiety 5-6/10 and denies depression. States that she is just worried about her son, Barnabas Lister. "I'll do whatever you want. You can call the police and take me into custody. You can read my journal. I just don't want any trouble or to get anyone else in trouble. I'm just worried about Barnabas Lister. I want to be a good mother and keep him safe. His father and his friends who are police try to get Barnabas Lister in trouble and they laugh at me. I'm trying to keep the peace. His father is racist and tells Barnabas Lister not to have friends of different races." Pt has rapid, pressured speech. Limited insight. States she feels better today and less scared.  ?Pt attending groups and eating meals. Pt endorses good sleep.  ?15 minute checks for safety. Pt remains safe on unit. ?

## 2022-02-27 ENCOUNTER — Other Ambulatory Visit: Payer: Self-pay

## 2022-02-27 DIAGNOSIS — F3163 Bipolar disorder, current episode mixed, severe, without psychotic features: Secondary | ICD-10-CM | POA: Diagnosis not present

## 2022-02-27 MED ORDER — LEVETIRACETAM 500 MG PO TABS
500.0000 mg | ORAL_TABLET | Freq: Two times a day (BID) | ORAL | 0 refills | Status: DC
Start: 2022-02-27 — End: 2022-02-28
  Filled 2022-02-27: qty 20, 10d supply, fill #0

## 2022-02-27 MED ORDER — HYDROXYZINE HCL 50 MG PO TABS
50.0000 mg | ORAL_TABLET | Freq: Three times a day (TID) | ORAL | 0 refills | Status: DC | PRN
  Filled 2022-02-27: qty 20, 7d supply, fill #0

## 2022-02-27 MED ORDER — QUETIAPINE FUMARATE 400 MG PO TABS
400.0000 mg | ORAL_TABLET | Freq: Every day | ORAL | 0 refills | Status: DC
  Filled 2022-02-27: qty 10, 10d supply, fill #0

## 2022-02-27 NOTE — Plan of Care (Signed)
?  Problem: Education: ?Goal: Knowledge of [facility] Education information/materials will improve ?Outcome: Progressing ?Goal: Verbalization of understanding the information provided will improve ?Outcome: Progressing ?  ?Problem: Health Behavior/Discharge Planning: ?Goal: Compliance with treatment plan for underlying cause of condition will improve ?Outcome: Progressing ?  ?Problem: Safety: ?Goal: Periods of time without injury will increase ?Outcome: Progressing ?  ?Problem: Coping: ?Goal: Will verbalize feelings ?Outcome: Progressing ?  ?Problem: Health Behavior/Discharge Planning: ?Goal: Compliance with therapeutic regimen will improve ?Outcome: Progressing ?  ?

## 2022-02-27 NOTE — Progress Notes (Signed)
D: Patient alert and oriented. Patient denies pain, but states she has emotional pain  worrying about her son. Patient rates anxiety and depression 5/10. Patient denies SI/HI/AVH. Patient states she feels better today because she talked to the nurse last night and thinks that counseling would be beneficial for her.   ? ?A: Scheduled medications administered to patient, per MD orders. Support and encouragement provided to patient.  ?Q15 minute safety checks maintained.  ? ?R: Patient compliant with medication administration and treatment plan. No adverse drug reactions noted. Patient remains safe on the unit at this time.  ?

## 2022-02-27 NOTE — Plan of Care (Signed)
?  Problem: Education: ?Goal: Emotional status will improve ?Outcome: Progressing ?Goal: Mental status will improve ?Outcome: Progressing ?Goal: Verbalization of understanding the information provided will improve ?Outcome: Progressing ?  ?Problem: Health Behavior/Discharge Planning: ?Goal: Compliance with treatment plan for underlying cause of condition will improve ?Outcome: Progressing ?  ?Problem: Safety: ?Goal: Periods of time without injury will increase ?Outcome: Progressing ?  ?Problem: Coping: ?Goal: Coping ability will improve ?Outcome: Progressing ?Goal: Will verbalize feelings ?Outcome: Progressing ?  ?Problem: Self-Concept: ?Goal: Level of anxiety will decrease ?Outcome: Progressing ?  ?Problem: Self-Concept: ?Goal: Ability to identify factors that promote anxiety will improve ?Outcome: Progressing ?  ?Problem: Health Behavior/Discharge Planning: ?Goal: Ability to make decisions will improve ?Outcome: Progressing ?Goal: Compliance with therapeutic regimen will improve ?Outcome: Progressing ?  ?

## 2022-02-27 NOTE — Group Note (Signed)
[place] LCSW Group Therapy Note ? ? ?Group Date: 02/27/2022 ?Start Time: 1300 ?End Time: 1400 ? ? ?Type of Therapy/Topic:  Group Therapy:  Balance in Life ? ?Participation Level:  Active  ? ?Description of Group:   ? This group will address the concept of balance and how it feels and looks when one is unbalanced. Patients will be encouraged to process areas in their lives that are out of balance, and identify reasons for remaining unbalanced. Facilitators will guide patients utilizing problem- solving interventions to address and correct the stressor making their life unbalanced. Understanding and applying boundaries will be explored and addressed for obtaining  and maintaining a balanced life. Patients will be encouraged to explore ways to assertively make their unbalanced needs known to significant others in their lives, using other group members and facilitator for support and feedback. ? ?Therapeutic Goals: ?Patient will identify two or more emotions or situations they have that consume much of in their lives. ?Patient will identify signs/triggers that life has become out of balance:  ?Patient will identify two ways to set boundaries in order to achieve balance in their lives:  ?Patient will demonstrate ability to communicate their needs through discussion and/or role plays ? ?Summary of Patient Progress: ? ? ? ?Patient was present for the entirety of the group session. Patient was an active listener and participated in the topic of discussion, provided helpful advice to others, and added nuance to topic of conversation. Patient shared she plans on "cutting all toxic people out of her life" CSW discussed with patient what she considers as toxic people. Group contributed to concept of what make a toxic person.  ? ? ? ?Therapeutic Modalities:   ?Cognitive Behavioral Therapy ?Solution-Focused Therapy ?Assertiveness Training ? ? ?[name], LCSWA ?

## 2022-02-27 NOTE — Progress Notes (Signed)
?   02/27/22 2045  ?Psych Admission Type (Psych Patients Only)  ?Admission Status Involuntary  ?Psychosocial Assessment  ?Patient Complaints Worrying;Anxiety;Depression  ?Eye Contact Fair  ?Facial Expression Anxious  ?Affect Anxious  ?Speech Logical/coherent  ?Interaction Assertive;Needy  ?Motor Activity Slow  ?Appearance/Hygiene In scrubs;Unremarkable  ?Behavior Characteristics Cooperative;Anxious  ?Mood Anxious;Pleasant  ?Thought Process  ?Coherency Concrete thinking  ?Content Blaming self  ?Delusions Paranoid  ?Perception WDL  ?Hallucination None reported or observed  ?Judgment Limited  ?Confusion None  ?Danger to Self  ?Current suicidal ideation? Denies  ?Danger to Others  ?Danger to Others None reported or observed  ? ?Pt seen in her room. Pt denies SI, HI, AVH and pain. Pt showed this writer her rashes on bilateral axilla area and says they are better. Denies burning. "I stopped using the deodorant even though I felt like I was smelly. They are getting better. I told the doctor not to worry about them." Pt rates anxiety 5/10 and depression 5-6/10. Pt states that she feels better and is less anxious. "I thought I was agitated but I think I'm just anxious about seeing Barnabas Lister tomorrow." Pt still pressured and rapid but has some insight into her situation. States she loves to journal and is ready for counseling. Reiterated that she will not talk to her ex-husband even if he seems nice to her. States that she is courageous and brave and can be an active participant in her life. ?Pt attended groups today. ?15 min checks for safety. Pt remains safe on unit. ?

## 2022-02-27 NOTE — Progress Notes (Signed)
Recreation Therapy Notes ? ? ?Date: 02/27/2022 ? ?Time: 10:30 am    ? ?Location: Courtyard   ? ?Behavioral response: Appropriate ? ?Intervention Topic: Wellness    ? ?Discussion/Intervention:  ?Group content today was focused on Wellness. The group defined wellness and some positive ways they make decisions for themselves. Individuals expressed reasons why they neglected any wellness in the past. Patients described ways to improve wellness skills in the future. The group explained what could happen if they did not do any wellness at all. Participants express how bad choices has affected them and others around them. Individual explained the importance of wellness. The group participated in the intervention ?Testing my Wellness? where they had a chance to identify some of their weaknesses and strengths in wellness.  ?Clinical Observations/Feedback: ?Patient came to group and identified journaling as an activity she likes to participate in for wellness. Individual was social with peers and staff while participating in the intervention.    ?Cashton Hosley LRT/CTRS  ? ? ? ? ? ? ? ? ?Kitana Gage ?02/27/2022 12:38 PM ?

## 2022-02-27 NOTE — Progress Notes (Signed)
[facility] MD Progress Note ? ?02/27/2022 3:40 PM ?Misty Howell  ?MRN:  [id number] ?Subjective: Patient seen and chart reviewed.  Patient tells me she is feeling better.  She was able to sit calmly making good eye contact today and talk lucidly.  She denied feeling afraid.  Said she is sleeping better.  She still seemed a little bit off just in so far as talking about locking her medicine up after discharge but otherwise no sign of psychosis and overall seems more organized. ?Principal Problem: Bipolar 1 disorder, mixed, severe ([place]) ?Diagnosis: Principal Problem: ?  Bipolar 1 disorder, mixed, severe ([place]) ?Active Problems: ?  Vitamin D deficiency ?  Seizure disorder ([place]) ? ?Total Time spent with patient: 30 minutes ? ?Past Psychiatric History: Past history see previous. ? ?Past Medical History:  ?Past Medical History:  ?Diagnosis Date  ? Anxiety   ? Cancer [facility])   ? Giant cell tumor left fifth finger.  ? Hypercholesterolemia   ? Overweight(278.02)   ? Seizures ([place])   ? Syncope and collapse   ? Vision abnormalities   ? Vitamin D deficiency   ?  ?Past Surgical History:  ?Procedure Laterality Date  ? CESAREAN SECTION    ? FINGER SURGERY    ? due to giant cell tumor, hip bone placed in finger  ? ?Family History:  ?Family History  ?Problem Relation Age of Onset  ? Hypertension Mother   ? Migraines Mother   ? Hypertension Father   ? ?Family Psychiatric  History: See previous ?Social History:  ?Social History  ? ?Substance and Sexual Activity  ?Alcohol Use Not Currently  ? Comment: social  ?   ?Social History  ? ?Substance and Sexual Activity  ?Drug Use Yes  ? Types: Marijuana  ? Comment: Pt stated she used Delta 8 a couple weeks ago  ?  ?Social History  ? ?Socioeconomic History  ? Marital status: Legally Separated  ?  Spouse name: Not on file  ? Number of children: Not on file  ? Years of education: Not on file  ? Highest education level: Not on file  ?Occupational History  ? Occupation: Finance  ?Tobacco Use  ? Smoking  status: Former  ?  Packs/day: 0.00  ?  Years: 3.00  ?  Pack years: 0.00  ?  Types: Cigarettes  ?  Quit date: 01/20/1989  ?  Years since quitting: 33.1  ? Smokeless tobacco: Never  ?Vaping Use  ? Vaping Use: Never used  ?Substance and Sexual Activity  ? Alcohol use: Not Currently  ?  Comment: social  ? Drug use: Yes  ?  Types: Marijuana  ?  Comment: Pt stated she used Delta 8 a couple weeks ago  ? Sexual activity: Not Currently  ?Other Topics Concern  ? Not on file  ?Social History Narrative  ? Pt lives in [place] with mother and two teenaged sons.  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  Not followed by an outpatient psych provider.  ? ?Social Determinants of Health  ? ?Financial Resource Strain: Not on file  ?Food Insecurity: Not on file  ?Transportation Needs: Not on file  ?Physical Activity: Not on file  ?Stress: Not on file  ?Social Connections: Not on file  ? ?Additional Social History:  ?  ?  ?  ?  ?  ?  ?  ?  ?  ?  ?  ? ?Sleep: Fair ? ?Appetite:  Fair ? ?Current Medications: ?Current Facility-Administered Medications  ?Medication Dose Route Frequency  Provider Last Rate Last Admin  ? acetaminophen (TYLENOL) tablet 650 mg  650 mg Oral Q6H PRN Roxi Hlavaty T, MD      ? alum & mag hydroxide-simeth (MAALOX/MYLANTA) 200-200-20 MG/5ML suspension 30 mL  30 mL Oral Q4H PRN Halen Mossbarger T, MD      ? hydrOXYzine (ATARAX) tablet 50 mg  50 mg Oral TID PRN Aidah Forquer, Madie Reno, MD   50 mg at 02/22/22 2130  ? levETIRAcetam (KEPPRA) tablet 500 mg  500 mg Oral BID Jedrick Hutcherson T, MD   500 mg at 02/27/22 1030  ? magnesium hydroxide (MILK OF MAGNESIA) suspension 30 mL  30 mL Oral Daily PRN Illana Nolting, Madie Reno, MD   30 mL at 02/23/22 1810  ? QUEtiapine (SEROQUEL) tablet 400 mg  400 mg Oral QHS Wynonia Medero T, MD   400 mg at 02/26/22 2119  ? temazepam (RESTORIL) capsule 15 mg  15 mg Oral QHS PRN Katti Pelle, Madie Reno, MD   15 mg at 02/26/22 2119  ? ? ?Lab Results: No results found for this or any previous visit (from the past 48 hour(s)). ? ?Blood  Alcohol level:  ?Lab Results  ?Component Value Date  ? ETH <10 02/18/2022  ? ETH <10 01/03/2022  ? ? ?Metabolic Disorder Labs: ?Lab Results  ?Component Value Date  ? HGBA1C 5.8 (H) 02/21/2022  ? MPG 119.76 02/21/2022  ? MPG 114.02 01/07/2022  ? ?No results found for: PROLACTIN ?Lab Results  ?Component Value Date  ? CHOL 138 02/21/2022  ? TRIG 172 (H) 02/21/2022  ? HDL 42 02/21/2022  ? CHOLHDL 3.3 02/21/2022  ? VLDL 34 02/21/2022  ? [place] 62 02/21/2022  ? [place] 74 01/07/2022  ? ? ?Physical Findings: ?AIMS:  , ,  ,  ,    ?CIWA:    ?COWS:    ? ?Musculoskeletal: ?Strength & Muscle Tone: within normal limits ?Gait & Station: normal ?Patient leans: N/A ? ?Psychiatric Specialty Exam: ? ?Presentation  ?General Appearance: Appropriate for Environment ? ?Eye Contact:Good ? ?Speech:Clear and Coherent; Normal Rate ? ?Speech Volume:Normal ? ?Handedness:Right ? ? ?Mood and Affect  ?Mood:Anxious ? ?Affect:Congruent ? ? ?Thought Process  ?Thought Processes:Coherent; Linear ? ?Descriptions of Associations:Circumstantial ? ?Orientation:Full (Time, Place and Person) ? ?Thought Content:Paranoid Ideation ? ?History of Schizophrenia/Schizoaffective disorder:No ? ?Duration of Psychotic Symptoms:N/A ? ?Hallucinations:No data recorded ?Ideas of Reference:None ? ?Suicidal Thoughts:No data recorded ?Homicidal Thoughts:No data recorded ? ?Sensorium  ?Memory:Immediate Fair; Recent Fair ? ?Judgment:Fair ? ?Insight:Present ? ? ?Executive Functions  ?Concentration:Fair ? ?Attention Span:Fair ? ?Recall:Fair ? ?[place] ? ?Language:Good ? ? ?Psychomotor Activity  ?Psychomotor Activity:No data recorded ? ?Assets  ?Assets:Communication Skills; Desire for Improvement; Financial Resources/Insurance; Housing; Social Support ? ? ?Sleep  ?Sleep:No data recorded ? ? ?Physical Exam: ?Physical Exam ?Vitals and nursing note reviewed.  ?Constitutional:   ?   Appearance: Normal appearance.  ?HENT:  ?   Head: Normocephalic and atraumatic.  ?    Mouth/Throat:  ?   Pharynx: Oropharynx is clear.  ?Eyes:  ?   Pupils: Pupils are equal, round, and reactive to light.  ?Cardiovascular:  ?   Rate and Rhythm: Normal rate and regular rhythm.  ?Pulmonary:  ?   Effort: Pulmonary effort is normal.  ?   Breath sounds: Normal breath sounds.  ?Abdominal:  ?   General: Abdomen is flat.  ?   Palpations: Abdomen is soft.  ?Musculoskeletal:     ?   General: Normal range of motion.  ?Skin: ?  General: Skin is warm and dry.  ?Neurological:  ?   General: No focal deficit present.  ?   Mental Status: She is alert. Mental status is at baseline.  ?Psychiatric:     ?   Attention and Perception: Attention normal.     ?   Mood and Affect: Mood normal.     ?   Speech: Speech normal.     ?   Behavior: Behavior normal.     ?   Thought Content: Thought content normal.     ?   Cognition and Memory: Cognition normal.     ?   Judgment: Judgment normal.  ? ?Review of Systems  ?Constitutional: Negative.   ?HENT: Negative.    ?Eyes: Negative.   ?Respiratory: Negative.    ?Cardiovascular: Negative.   ?Gastrointestinal: Negative.   ?Musculoskeletal: Negative.   ?Skin: Negative.   ?Neurological: Negative.   ?Psychiatric/Behavioral: Negative.    ?Blood pressure 111/81, pulse (!) 103, temperature 98.1 ?F (36.7 ?C), temperature source Oral, resp. rate 18, height '5\' 4"'$  (1.626 m), weight 86.2 kg, SpO2 96 %. Body mass index is 32.61 kg/m?. ? ? ?Treatment Plan Summary: ?Medication management and Plan continue medication.  I am going to ask for a 10-day supply and then we will provide prescriptions with a plan to discharge the patient tomorrow to local follow-up.  Reviewed plan with patient. ? ?Alethia Berthold, MD ?02/27/2022, 3:40 PM ? ?

## 2022-02-28 DIAGNOSIS — F3163 Bipolar disorder, current episode mixed, severe, without psychotic features: Secondary | ICD-10-CM | POA: Diagnosis not present

## 2022-02-28 MED ORDER — LEVETIRACETAM 500 MG PO TABS: 500.0000 mg | ORAL_TABLET | Freq: Two times a day (BID) | ORAL | 1 refills | Status: AC

## 2022-02-28 MED ORDER — TEMAZEPAM 15 MG PO CAPS: 15.0000 mg | ORAL_CAPSULE | Freq: Every evening | ORAL | 1 refills | Status: DC | PRN

## 2022-02-28 MED ORDER — HYDROXYZINE HCL 50 MG PO TABS
50.0000 mg | ORAL_TABLET | Freq: Three times a day (TID) | ORAL | 1 refills | Status: DC | PRN
Start: 2022-02-28 — End: 2023-11-17

## 2022-02-28 MED ORDER — QUETIAPINE FUMARATE 400 MG PO TABS: 400.0000 mg | ORAL_TABLET | Freq: Every day | ORAL | 1 refills | Status: DC

## 2022-02-28 NOTE — Progress Notes (Signed)
Recreation Therapy Notes ? ?Date: 02/28/2022  ?  ?Time: 10:45am  ?  ?Location: Courtyard  ?  ?Behavioral response: Appropriate ?  ?Intervention Topic:  Social Skills  ?  ?Discussion/Intervention:  ?Group content on today was focused on social skills. The group defined social skills and identified ways they use social skills. Patients expressed what obstacles they face when trying to be social. Participants described the importance of social skills. The group listed ways to improve social skills and reasons to improve social skills. Individuals had an opportunity to learn new and improve social skills as well as identify their weaknesses. ?  ?Clinical Observations/Feedback: ?Patient came to group and expressed past experiences with socializing. Individual was social with peers and staff while participating in the intervention.   ?Francesa Eugenio LRT/CTRS  ?  ? ? ? ? ? ? ? ?Hyman Crossan ?02/28/2022 12:08 PM ?

## 2022-02-28 NOTE — Discharge Summary (Signed)
Physician Discharge Summary Note ? ?Patient:  Misty Howell is an 56 y.o., female ?MRN:  [id number] ?DOB:  09-Jan-1966 ?Patient phone:  [phone] (home)  ?Patient address:   ?[address] ?[place] [address],  ?Total Time spent with patient: 30 minutes ? ?Date of Admission:  02/20/2022 ?Date of Discharge: 02/28/2022 ? ?Reason for Admission: Patient was admitted in transfer from [facility] where she had presented with disorganized psychotic thinking mood instability poor self-care evidence psychosis ? ?Principal Problem: Bipolar 1 disorder, mixed, severe ([place]) ?Discharge Diagnoses: Principal Problem: ?  Bipolar 1 disorder, mixed, severe ([place]) ?Active Problems: ?  Vitamin D deficiency ?  Seizure disorder ([place]) ? ? ?Past Psychiatric History: Patient has a past history of mood symptoms and depression ? ?Past Medical History:  ?Past Medical History:  ?Diagnosis Date  ? Anxiety   ? Cancer [facility])   ? Giant cell tumor left fifth finger.  ? Hypercholesterolemia   ? Overweight(278.02)   ? Seizures ([place])   ? Syncope and collapse   ? Vision abnormalities   ? Vitamin D deficiency   ?  ?Past Surgical History:  ?Procedure Laterality Date  ? CESAREAN SECTION    ? FINGER SURGERY    ? due to giant cell tumor, hip bone placed in finger  ? ?Family History:  ?Family History  ?Problem Relation Age of Onset  ? Hypertension Mother   ? Migraines Mother   ? Hypertension Father   ? ?Family Psychiatric  History: See previous ?Social History:  ?Social History  ? ?Substance and Sexual Activity  ?Alcohol Use Not Currently  ? Comment: social  ?   ?Social History  ? ?Substance and Sexual Activity  ?Drug Use Yes  ? Types: Marijuana  ? Comment: Pt stated she used Delta 8 a couple weeks ago  ?  ?Social History  ? ?Socioeconomic History  ? Marital status: Legally Separated  ?  Spouse name: Not on file  ? Number of children: Not on file  ? Years of education: Not on file  ? Highest education level: Not on file  ?Occupational History  ?  Occupation: Finance  ?Tobacco Use  ? Smoking status: Former  ?  Packs/day: 0.00  ?  Years: 3.00  ?  Pack years: 0.00  ?  Types: Cigarettes  ?  Quit date: 01/20/1989  ?  Years since quitting: 33.1  ? Smokeless tobacco: Never  ?Vaping Use  ? Vaping Use: Never used  ?Substance and Sexual Activity  ? Alcohol use: Not Currently  ?  Comment: social  ? Drug use: Yes  ?  Types: Marijuana  ?  Comment: Pt stated she used Delta 8 a couple weeks ago  ? Sexual activity: Not Currently  ?Other Topics Concern  ? Not on file  ?Social History Narrative  ? Pt lives in [place] with mother and two teenaged sons.  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  Not followed by an outpatient psych provider.  ? ?Social Determinants of Health  ? ?Financial Resource Strain: Not on file  ?Food Insecurity: Not on file  ?Transportation Needs: Not on file  ?Physical Activity: Not on file  ?Stress: Not on file  ?Social Connections: Not on file  ? ? ?Hospital Course: Admitted to psychiatric unit.  15-minute checks continued.  Patient did not display dangerous violent aggressive or suicidal behavior during her hospital stay.  She was cooperative with medication.  Initially patient seemed very frightened and confused.  She would describe herself as being scared without being  able to identify what she was frightened of.  She stayed very isolated.  Her talk tended to be paranoid and confused with indications that she felt that people outside the hospital were out to harm her.  Given past medication treatments and history it seemed to me more likely that she had a bipolar disorder with mixed features and I started her on Seroquel.  Patient tolerated medicine well and it was tolerated up step-by-step to a total of 400 mg.  She is now on 400 mg of Seroquel at night and tolerating it well.  She has still been using Restoril 15 mg as needed at night for sleep.  She has continued on her Keppra for her reported history of seizure disorder.  No seizures by the way ever noted  in the hospital.  At the time of discharge she is greatly improved.  No longer appears to be paranoid.  Denies feeling frightened.  Agrees to plan to stay with her mother and to follow up with mental health treatment.  She will be given a 10-day supply of her medicine and prescriptions for the next couple months and referred for outpatient mental health follow-up ? ?Physical Findings: ?AIMS:  , ,  ,  ,    ?CIWA:    ?COWS:    ? ?Musculoskeletal: ?Strength & Muscle Tone: within normal limits ?Gait & Station: normal ?Patient leans: N/A ? ? ?Psychiatric Specialty Exam: ? ?Presentation  ?General Appearance: Appropriate for Environment ? ?Eye Contact:Good ? ?Speech:Clear and Coherent; Normal Rate ? ?Speech Volume:Normal ? ?Handedness:Right ? ? ?Mood and Affect  ?Mood:Anxious ? ?Affect:Congruent ? ? ?Thought Process  ?Thought Processes:Coherent; Linear ? ?Descriptions of Associations:Circumstantial ? ?Orientation:Full (Time, Place and Person) ? ?Thought Content:Paranoid Ideation ? ?History of Schizophrenia/Schizoaffective disorder:No ? ?Duration of Psychotic Symptoms:N/A ? ?Hallucinations:No data recorded ?Ideas of Reference:None ? ?Suicidal Thoughts:No data recorded ?Homicidal Thoughts:No data recorded ? ?Sensorium  ?Memory:Immediate Fair; Recent Fair ? ?Judgment:Fair ? ?Insight:Present ? ? ?Executive Functions  ?Concentration:Fair ? ?Attention Span:Fair ? ?Recall:Fair ? ?[place] ? ?Language:Good ? ? ?Psychomotor Activity  ?Psychomotor Activity:No data recorded ? ?Assets  ?Assets:Communication Skills; Desire for Improvement; Financial Resources/Insurance; Housing; Social Support ? ? ?Sleep  ?Sleep:No data recorded ? ? ?Physical Exam: ?Physical Exam ?Vitals and nursing note reviewed.  ?Constitutional:   ?   Appearance: Normal appearance.  ?HENT:  ?   Head: Normocephalic and atraumatic.  ?   Mouth/Throat:  ?   Pharynx: Oropharynx is clear.  ?Eyes:  ?   Pupils: Pupils are equal, round, and reactive to light.   ?Cardiovascular:  ?   Rate and Rhythm: Normal rate and regular rhythm.  ?Pulmonary:  ?   Effort: Pulmonary effort is normal.  ?   Breath sounds: Normal breath sounds.  ?Abdominal:  ?   General: Abdomen is flat.  ?   Palpations: Abdomen is soft.  ?Musculoskeletal:     ?   General: Normal range of motion.  ?Skin: ?   General: Skin is warm and dry.  ?Neurological:  ?   General: No focal deficit present.  ?   Mental Status: She is alert. Mental status is at baseline.  ?Psychiatric:     ?   Attention and Perception: Attention normal.     ?   Mood and Affect: Mood normal.     ?   Speech: Speech normal.     ?   Behavior: Behavior is cooperative.     ?   Thought Content: Thought content  normal.     ?   Cognition and Memory: Cognition normal.     ?   Judgment: Judgment normal.  ? ?Review of Systems  ?Constitutional: Negative.   ?HENT: Negative.    ?Eyes: Negative.   ?Respiratory: Negative.    ?Cardiovascular: Negative.   ?Gastrointestinal: Negative.   ?Musculoskeletal: Negative.   ?Skin: Negative.   ?Neurological: Negative.   ?Psychiatric/Behavioral: Negative.    ?Blood pressure (!) 145/96, pulse (!) 106, temperature 98 ?F (36.7 ?C), temperature source Oral, resp. rate 16, height '5\' 4"'$  (1.626 m), weight 86.2 kg, SpO2 96 %. Body mass index is 32.61 kg/m?. ? ? ?Social History  ? ?Tobacco Use  ?Smoking Status Former  ? Packs/day: 0.00  ? Years: 3.00  ? Pack years: 0.00  ? Types: Cigarettes  ? Quit date: 01/20/1989  ? Years since quitting: 33.1  ?Smokeless Tobacco Never  ? ?Tobacco Cessation:  N/A, patient does not currently use tobacco products ? ? ?Blood Alcohol level:  ?Lab Results  ?Component Value Date  ? ETH <10 02/18/2022  ? ETH <10 01/03/2022  ? ? ?Metabolic Disorder Labs:  ?Lab Results  ?Component Value Date  ? HGBA1C 5.8 (H) 02/21/2022  ? MPG 119.76 02/21/2022  ? MPG 114.02 01/07/2022  ? ?No results found for: PROLACTIN ?Lab Results  ?Component Value Date  ? CHOL 138 02/21/2022  ? TRIG 172 (H) 02/21/2022  ? HDL 42  02/21/2022  ? CHOLHDL 3.3 02/21/2022  ? VLDL 34 02/21/2022  ? [place] 62 02/21/2022  ? [place] 74 01/07/2022  ? ? ?See Psychiatric Specialty Exam and Suicide Risk Assessment completed by Attending Physician prior to

## 2022-02-28 NOTE — Plan of Care (Signed)
?  Problem: Group Participation ?Goal: STG - Patient will focus on task/topic with 2 prompts from staff within 5 recreation therapy group sessions ?Description: STG - Patient will focus on task/topic with 2 prompts from staff within 5 recreation therapy group sessions ?Outcome: Completed/Met ?  ?

## 2022-02-28 NOTE — BHH Suicide Risk Assessment (Signed)
[facility] Discharge Suicide Risk Assessment ? ? ?Principal Problem: Bipolar 1 disorder, mixed, severe ([place]) ?Discharge Diagnoses: Principal Problem: ?  Bipolar 1 disorder, mixed, severe ([place]) ?Active Problems: ?  Vitamin D deficiency ?  Seizure disorder ([place]) ? ? ?Total Time spent with patient: 30 minutes ? ?Musculoskeletal: ?Strength & Muscle Tone: within normal limits ?Gait & Station: normal ?Patient leans: N/A ? ?Psychiatric Specialty Exam ? ?Presentation  ?General Appearance: Appropriate for Environment ? ?Eye Contact:Good ? ?Speech:Clear and Coherent; Normal Rate ? ?Speech Volume:Normal ? ?Handedness:Right ? ? ?Mood and Affect  ?Mood:Anxious ? ?Duration of Depression Symptoms: Less than two weeks ? ?Affect:Congruent ? ? ?Thought Process  ?Thought Processes:Coherent; Linear ? ?Descriptions of Associations:Circumstantial ? ?Orientation:Full (Time, Place and Person) ? ?Thought Content:Paranoid Ideation ? ?History of Schizophrenia/Schizoaffective disorder:No ? ?Duration of Psychotic Symptoms:N/A ? ?Hallucinations:No data recorded ?Ideas of Reference:None ? ?Suicidal Thoughts:No data recorded ?Homicidal Thoughts:No data recorded ? ?Sensorium  ?Memory:Immediate Fair; Recent Fair ? ?Judgment:Fair ? ?Insight:Present ? ? ?Executive Functions  ?Concentration:Fair ? ?Attention Span:Fair ? ?Recall:Fair ? ?[place] ? ?Language:Good ? ? ?Psychomotor Activity  ?Psychomotor Activity:No data recorded ? ?Assets  ?Assets:Communication Skills; Desire for Improvement; Financial Resources/Insurance; Housing; Social Support ? ? ?Sleep  ?Sleep:No data recorded ? ?Physical Exam: ?Physical Exam ?Vitals and nursing note reviewed.  ?Constitutional:   ?   Appearance: Normal appearance.  ?HENT:  ?   Head: Normocephalic and atraumatic.  ?   Mouth/Throat:  ?   Pharynx: Oropharynx is clear.  ?Eyes:  ?   Pupils: Pupils are equal, round, and reactive to light.  ?Cardiovascular:  ?   Rate and Rhythm: Normal rate and regular rhythm.   ?Pulmonary:  ?   Effort: Pulmonary effort is normal.  ?   Breath sounds: Normal breath sounds.  ?Abdominal:  ?   General: Abdomen is flat.  ?   Palpations: Abdomen is soft.  ?Musculoskeletal:     ?   General: Normal range of motion.  ?Skin: ?   General: Skin is warm and dry.  ?Neurological:  ?   General: No focal deficit present.  ?   Mental Status: She is alert. Mental status is at baseline.  ?Psychiatric:     ?   Attention and Perception: Attention normal.     ?   Mood and Affect: Mood normal.     ?   Speech: Speech normal.     ?   Behavior: Behavior is cooperative.     ?   Thought Content: Thought content normal.     ?   Cognition and Memory: Cognition normal.     ?   Judgment: Judgment normal.  ? ?Review of Systems  ?Constitutional: Negative.   ?HENT: Negative.    ?Eyes: Negative.   ?Respiratory: Negative.    ?Cardiovascular: Negative.   ?Gastrointestinal: Negative.   ?Musculoskeletal: Negative.   ?Skin: Negative.   ?Neurological: Negative.   ?Psychiatric/Behavioral: Negative.    ?Blood pressure (!) 145/96, pulse (!) 106, temperature 98 ?F (36.7 ?C), temperature source Oral, resp. rate 16, height '5\' 4"'$  (1.626 m), weight 86.2 kg, SpO2 96 %. Body mass index is 32.61 kg/m?. ? ?Mental Status Per Nursing Assessment::   ?On Admission:  NA ? ?Demographic Factors:  ?Caucasian ? ?Loss Factors: ?Loss of significant relationship ? ?Historical Factors: ?NA ? ?Risk Reduction Factors:   ?Sense of responsibility to family, Religious beliefs about death, Living with another person, especially a relative, Positive social support, and Positive therapeutic relationship ? ?Continued Clinical Symptoms:  ?  Bipolar Disorder:   Mixed State ? ?Cognitive Features That Contribute To Risk:  ?None   ? ?Suicide Risk:  ?Minimal: No identifiable suicidal ideation.  Patients presenting with no risk factors but with morbid ruminations; may be classified as minimal risk based on the severity of the depressive symptoms ? ? ? ?Plan Of  Care/Follow-up recommendations:  ?Patient at the time of discharge is presenting with a calm and more positive mood.  No longer describes herself as feeling scared or frightened.  Denies suicidal or homicidal ideation.  Thinking is lucid and she is capable of making reasonable decisions and discussing her treatment plan.  She agrees to continue current psychiatric medicine.  Totally denies any suicidal ideation and has not shown any dangerous behavior in the hospital. ? ?Alethia Berthold, MD ?02/28/2022, 10:26 AM ?

## 2022-02-28 NOTE — Progress Notes (Signed)
?  [facility] Adult Case Management Discharge Plan : ? ?Will you be returning to the same living situation after discharge:  Yes,  pt reports that she is returning home. ?At discharge, do you have transportation home?: Yes,  pt reports that her mother will provide transportation. ?Do you have the ability to pay for your medications: No. ? ?Release of information consent forms completed and in the chart;  Patient's signature needed at discharge. ? ?Patient to Follow up at: ? Follow-up Information   ? ? [facility] Follow up.   ?Specialty: Urgent Care ?Why: Psychiatry Walk-Ins will be available on Monday, Wednesday, Thursday and Friday only from 8am-11am.  Please arrive by 7:30 AM.   ? ?Assessment/Therapy Walk-Ins will be available on Monday and Wednesday?s 8am-11am.  Please arrive by 7:30AM. ?Contact information: ?[address] ?[place] ?[phone] ? ?  ?  ? ?  ?  ? ?  ? ? ?Next level of care provider has access to [place] ? ?Safety Planning and Suicide Prevention discussed: Yes,  SPE completed with the patient's sister.  ? ?  ? ?Has patient been referred to the Quitline?: Patient refused referral  Pt denied being a smoker and declined referral for Quitline.  ? ?Patient has been referred for addiction treatment: Pt. refused referral ? ?Rozann Lesches, LCSW ?02/28/2022, 10:59 AM ?

## 2022-02-28 NOTE — Progress Notes (Signed)
Recreation Therapy Notes ? ?INPATIENT RECREATION TR PLAN ? ?Patient Details ?Name: Misty Howell ?MRN: [id number] ?DOB: 11-Aug-1966 ?Today's Date: 02/28/2022 ? ?Rec Therapy Plan ?Is patient appropriate for Therapeutic Recreation?: Yes ?Treatment times per week: at least 3 ?Estimated Length of Stay: 5-7 days ?TR Treatment/Interventions: Group participation (Comment) ? ?Discharge Criteria ?Pt will be discharged from therapy if:: Discharged ?Treatment plan/goals/alternatives discussed and agreed upon by:: Patient/family ? ?Discharge Summary ?Short term goals set: Patient will focus on task/topic with 2 prompts from staff within 5 recreation therapy group sessions ?Short term goals met: Complete ?Progress toward goals comments: Groups attended ?Which groups?: Social skills, Wellness, Self-esteem, Leisure education, Other (Comment) (Self-care,Relaxation) ?Reason goals not met: N/A ?Therapeutic equipment acquired: N/A ?Reason patient discharged from therapy: Discharge from hospital ?Pt/family agrees with progress & goals achieved: Yes ?Date patient discharged from therapy: 02/28/22 ? ? ?Andrell Tallman ?02/28/2022, 3:18 PM ?

## 2022-02-28 NOTE — Progress Notes (Signed)
D: Pt alert and oriented. Pt denies experiencing any pain, SI/HI, or AVH at this time. Pt reports she will be able to keep herself safe when she returns home.  ? ?A: Pt received discharge and medication education/information. Pt belongings were returned and signed for at this time to include home medication supply and printed prescriptions.  ? ?R: Pt verbalized understanding of discharge and medication education/information. ? ?Pt escorted by staff to medical mall front lobby where pt's mother picked her up. ?

## 2022-02-28 NOTE — Plan of Care (Signed)
D: Pt alert and oriented. Pt rates depression 3/10, hopelessness 1/10, and anxiety 2/10. Pt goal: "To take steps to see my family, pray, read my bible, to go to meetings to learn how to cope with daily stress." Pt reports energy level as normal and concentration as being good. Pt reports sleep last night as being good. Pt did not receive medications for sleep. Pt denies experiencing any pain at this time. Pt denies experiencing any SI/HI, or AVH at this time.  ? ?A: Scheduled medications administered to pt, per MD orders. Support and encouragement provided. Frequent verbal contact made. Routine safety checks conducted q15 minutes.  ? ?R: No adverse drug reactions noted. Pt verbally contracts for safety at this time. Pt compliant with medications and treatment plan. Pt interacts well with others on the unit. Pt remains safe at this time. Will continue to monitor.  ? ?Problem: Education: ?Goal: Emotional status will improve ?Outcome: Progressing ?Goal: Mental status will improve ?Outcome: Progressing ?  ?

## 2022-03-05 ENCOUNTER — Ambulatory Visit (HOSPITAL_COMMUNITY)
Admission: EM | Admit: 2022-03-05 | Discharge: 2022-03-05 | Disposition: A | Discharge status: Home / Self Care | Payer: No Payment, Other

## 2022-03-05 NOTE — BH Assessment (Addendum)
Pt looking for outpatient resources for therapy and medication managment after being discharged from [facility] on 4/21. Denies SI, HI, AVH. Waiver signed. ?

## 2022-07-07 ENCOUNTER — Other Ambulatory Visit (HOSPITAL_COMMUNITY): Payer: Self-pay

## 2023-04-20 ENCOUNTER — Emergency Department (HOSPITAL_COMMUNITY)
Admission: EM | Admit: 2023-04-20 | Discharge: 2023-04-21 | Disposition: A | Discharge status: Other Acute Inpatient Hospital | Payer: No Typology Code available for payment source | Attending: Emergency Medicine | Admitting: Emergency Medicine

## 2023-04-20 ENCOUNTER — Other Ambulatory Visit: Payer: Self-pay

## 2023-04-20 DIAGNOSIS — F1410 Cocaine abuse, uncomplicated: Secondary | ICD-10-CM | POA: Insufficient documentation

## 2023-04-20 DIAGNOSIS — F1910 Other psychoactive substance abuse, uncomplicated: Secondary | ICD-10-CM | POA: Insufficient documentation

## 2023-04-20 DIAGNOSIS — F419 Anxiety disorder, unspecified: Secondary | ICD-10-CM | POA: Insufficient documentation

## 2023-04-20 DIAGNOSIS — E876 Hypokalemia: Secondary | ICD-10-CM | POA: Diagnosis not present

## 2023-04-20 DIAGNOSIS — F319 Bipolar disorder, unspecified: Secondary | ICD-10-CM | POA: Diagnosis present

## 2023-04-20 DIAGNOSIS — Z8589 Personal history of malignant neoplasm of other organs and systems: Secondary | ICD-10-CM | POA: Insufficient documentation

## 2023-04-20 DIAGNOSIS — F3112 Bipolar disorder, current episode manic without psychotic features, moderate: Secondary | ICD-10-CM | POA: Diagnosis not present

## 2023-04-20 LAB — MAGNESIUM: Magnesium: 1.7 mg/dL (ref 1.7–2.4)

## 2023-04-20 LAB — URINALYSIS, ROUTINE W REFLEX MICROSCOPIC
Bilirubin Urine: NEGATIVE
Glucose, UA: NEGATIVE mg/dL
Hgb urine dipstick: NEGATIVE
Ketones, ur: NEGATIVE mg/dL
Nitrite: NEGATIVE
Protein, ur: NEGATIVE mg/dL
Specific Gravity, Urine: 1.021 (ref 1.005–1.030)
pH: 5 (ref 5.0–8.0)

## 2023-04-20 LAB — COMPREHENSIVE METABOLIC PANEL
ALT: 21 U/L (ref 0–44)
AST: 23 U/L (ref 15–41)
Albumin: 3.9 g/dL (ref 3.5–5.0)
Alkaline Phosphatase: 74 U/L (ref 38–126)
Anion gap: 10 (ref 5–15)
BUN: 13 mg/dL (ref 6–20)
CO2: 26 mmol/L (ref 22–32)
Calcium: 8.9 mg/dL (ref 8.9–10.3)
Chloride: 99 mmol/L (ref 98–111)
Creatinine, Ser: 0.65 mg/dL (ref 0.44–1.00)
GFR, Estimated: >60 mL/min (ref >60)
Glucose, Bld: 109 mg/dL — ABNORMAL HIGH (ref 70–99)
Potassium: 2.6 mmol/L — CL (ref 3.5–5.1)
Sodium: 135 mmol/L (ref 135–145)
Total Bilirubin: 0.7 mg/dL (ref 0.3–1.2)
Total Protein: 7.5 g/dL (ref 6.5–8.1)

## 2023-04-20 LAB — CBC
HCT: 39.1 % (ref 36.0–46.0)
Hemoglobin: 13.1 g/dL (ref 12.0–15.0)
MCH: 29.6 pg (ref 26.0–34.0)
MCHC: 33.5 g/dL (ref 30.0–36.0)
MCV: 88.3 fL (ref 80.0–100.0)
Platelets: 225 10*3/uL (ref 150–400)
RBC: 4.43 MIL/uL (ref 3.87–5.11)
RDW: 12.6 % (ref 11.5–15.5)
WBC: 7.1 10*3/uL (ref 4.0–10.5)
nRBC: 0 % (ref 0.0–0.2)

## 2023-04-20 LAB — PREGNANCY, URINE: Preg Test, Ur: NEGATIVE

## 2023-04-20 LAB — RAPID URINE DRUG SCREEN, HOSP PERFORMED
Amphetamines: NOT DETECTED
Barbiturates: NOT DETECTED
Benzodiazepines: POSITIVE — AB
Cocaine: POSITIVE — AB
Opiates: NOT DETECTED
Tetrahydrocannabinol: NOT DETECTED

## 2023-04-20 LAB — ACETAMINOPHEN LEVEL: Acetaminophen (Tylenol), Serum: <10 ug/mL — ABNORMAL LOW (ref 10–30)

## 2023-04-20 LAB — ETHANOL: Alcohol, Ethyl (B): <10 mg/dL (ref <10)

## 2023-04-20 LAB — SALICYLATE LEVEL: Salicylate Lvl: <7 mg/dL — ABNORMAL LOW (ref 7.0–30.0)

## 2023-04-20 MED ORDER — LORAZEPAM 1 MG PO TABS
1.0000 mg | ORAL_TABLET | ORAL | Status: AC | PRN
  Administered 2023-04-21: 1 mg via ORAL
  Filled 2023-04-20: qty 1

## 2023-04-20 MED ORDER — POTASSIUM CHLORIDE CRYS ER 20 MEQ PO TBCR
40.0000 meq | EXTENDED_RELEASE_TABLET | Freq: Once | ORAL | Status: AC
  Administered 2023-04-20: 40 meq via ORAL
  Filled 2023-04-20: qty 2

## 2023-04-20 MED ORDER — AMLODIPINE BESYLATE 5 MG PO TABS
5.0000 mg | ORAL_TABLET | Freq: Once | ORAL | Status: AC
  Administered 2023-04-20: 5 mg via ORAL
  Filled 2023-04-20: qty 1

## 2023-04-20 MED ORDER — MAGNESIUM SULFATE 2 GM/50ML IV SOLN
2.0000 g | Freq: Once | INTRAVENOUS | Status: AC
  Administered 2023-04-20: 2 g via INTRAVENOUS
  Filled 2023-04-20: qty 50

## 2023-04-20 MED ORDER — ZIPRASIDONE MESYLATE 20 MG IM SOLR: 20.0000 mg | Freq: Two times a day (BID) | INTRAMUSCULAR | Status: DC | PRN

## 2023-04-20 MED ORDER — POTASSIUM CHLORIDE 10 MEQ/100ML IV SOLN
10.0000 meq | Freq: Once | INTRAVENOUS | Status: AC
  Administered 2023-04-20: 10 meq via INTRAVENOUS

## 2023-04-20 MED ORDER — POTASSIUM CHLORIDE 10 MEQ/100ML IV SOLN
10.0000 meq | Freq: Once | INTRAVENOUS | Status: AC
  Administered 2023-04-20: 10 meq via INTRAVENOUS
  Filled 2023-04-20: qty 100

## 2023-04-20 NOTE — ED Notes (Signed)
Pt ambulated to nurse station and requested something to eat and drink. Nurse gave pt drink and crackers.

## 2023-04-20 NOTE — ED Triage Notes (Addendum)
Pt presents after being terminated from her workplace today.  States she has seizure disorder.  Pt appears to have rambling speech and not focused on what brings her here today.  States she was "rambling to the wrong people and they reported me to the people who don't like me"  States "I won't pass a drug test cause someone she trusted gave her a xanax yesterday"  IVC paperwork taken out by mom.  Pt was picked up at her home where she lives with mom and brought in by [place] PD

## 2023-04-20 NOTE — Consult Note (Addendum)
Initial Consultation Note   Patient: Misty Howell:[id number] DOB: April 21, 1966 PCP: Lavinia Sharps, NP DOA: 04/20/2023 DOS: the patient was seen and examined on 04/20/2023 Primary service: Bethann Berkshire, MD  Referring physician: Ysidro Evert, PA.  Reason for consult: Hypokalemia  Assessment/Plan:  Hypokalemia-potassium down to 2.6.  Magnesium normal 1.7.  No GI losses.  At baseline appears to have hypokalemia to borderline hypokalemia.  EKG without significant abnormalities or changes today.  -Replete K- IV and oral. -Patient likely needs to be on low dose daily K supplementation- 10mg , please order this on discharge till follow-up with primary care provider as outpatient. -At this time she is not meeting criteria for admission for hypokalemia she should follow-up closely with her outpatient provider  Bipolar disorder-patient currently appears to be manic. -Disposition per ED provider.   TRH will sign off at present, please call us again when needed.  HPI: Misty Howell is a 57 y.o. female with past medical history of bipolar disorder, seizures, PTSD, generalized anxiety disorder. At the time of my evaluation, patient is awake alert oriented person and place list.  But she is currently talking continuously, speech is pressured and tangential . She answers initial questions.  She denies vomiting or loose stools.  No change in oral intake.  No chest pain no difficulty breathing no urinary symptoms.  Last seizure was about 3 years ago. Per ED staff documentation and triage notes, patient's sister says patient has stopped taking her bipolar meds.  She reported someone she trusted gave her Xanax, but this was laced with cocaine.  Patient's mom had IVC paperwork taken out on her, patient was picked up at home brought in by [place] [employer].  ED course-temperature 98.2.  Heart rate 85-123.  Respirate rate 16-22.  Blood pressure systolic 130s to 811B.  Sats greater than 97% on room air.   Potassium 2.6.  UA with rare bacteria.  Blood alcohol level WNL. UDS positive for cocaine and benzos. IV and Oral KCl given. Mag 2g given.  Review of Systems: As mentioned in the history of present illness. All other systems reviewed and are negative. Past Medical History:  Diagnosis Date   Anxiety    Cancer (HCC)    Giant cell tumor left fifth finger.   Hypercholesterolemia    Overweight(278.02)    Seizures (HCC)    Syncope and collapse    Vision abnormalities    Vitamin D deficiency    Past Surgical History:  Procedure Laterality Date   CESAREAN SECTION     FINGER SURGERY     due to giant cell tumor, hip bone placed in finger   Social History:  reports that she quit smoking about 34 years ago. Her smoking use included cigarettes. She has never used smokeless tobacco. She reports that she does not currently use alcohol. She reports current drug use. Drug: Marijuana.  No Known Allergies  Family History  Problem Relation Age of Onset   Hypertension Mother    Migraines Mother    Hypertension Father     Prior to Admission medications   Medication Sig Start Date End Date Taking? Authorizing Provider  hydrOXYzine (ATARAX) 50 MG tablet Take 1 tablet (50 mg total) by mouth 3 (three) times daily as needed for anxiety. 02/28/22   Clapacs, Jackquline Denmark, MD  levETIRAcetam (KEPPRA) 500 MG tablet Take 1 tablet (500 mg total) by mouth 2 (two) times daily. 02/28/22   Clapacs, Jackquline Denmark, MD  QUEtiapine (SEROQUEL) 400 MG tablet Take  1 tablet (400 mg total) by mouth at bedtime. 02/28/22   Clapacs, Jackquline Denmark, MD  temazepam (RESTORIL) 15 MG capsule Take 1 capsule (15 mg total) by mouth at bedtime as needed for sleep. 02/28/22   Clapacs, Jackquline Denmark, MD    Physical Exam: Vitals:   04/20/23 1454 04/20/23 1455 04/20/23 1807  BP: (!) 162/109  (!) 137/108  Pulse: (!) 123  (!) 108  Resp: 18  20  Temp: 98.2 F (36.8 C)  98.1 F (36.7 C)  TempSrc:   Oral  SpO2: 97%  100%  Weight:  85.7 kg   Height:  5\' 4"  (1.626  m)     Constitutional: NAD, calm, comfortable Eyes: PERRL, lids and conjunctivae normal ENMT: Mucous membranes are moist.  Neck: normal, supple, no masses, no thyromegaly Respiratory: clear to auscultation bilaterally, no wheezing, no crackles. Normal respiratory effort. No accessory muscle use.  Cardiovascular: Regular rate and rhythm, no murmurs / rubs / gallops. No extremity edema.Extremities warm. Abdomen: no tenderness, no masses palpated. No hepatosplenomegaly. Bowel sounds positive.  Musculoskeletal: no clubbing / cyanosis. No joint deformity upper and lower extremities.  Skin: no rashes, lesions, ulcers. No induration Neurologic: No apparent cranial nerve abnormalities, moving extremities spontaneously Psychiatric: Manic.  Pressured and tangential speech.  Family Communication: None at bedside Primary team communication: YES.  Thank you very much for involving [place] in the care of your patient.  Author: Onnie Boer, MD 04/20/2023 11:50 PM  For on call review [email or web address].

## 2023-04-20 NOTE — ED Notes (Addendum)
EDP made aware of pt's BP. See MAR Will continue care

## 2023-04-20 NOTE — ED Provider Notes (Signed)
[place] EMERGENCY DEPARTMENT AT [facility] Provider Note   CSN: [id number] Arrival date & time: 04/20/23  1448     History  Chief Complaint  Patient presents with   IVC    Misty Howell is a 57 y.o. female with past medical history of bipolar disorder, anxiety, vitamin D deficiency, hyperlipidemia who presents to the ED as IVC.  Patient states that she has "been telling the wrong people the wrong things and they sent me here to be locked away."  Patient has very pressured speech and does not answer most questions appropriately.  Does admit to recently using illicit substances and not taking her her daily medications.  Remainder of history limited secondary to psychiatric illness.      Home Medications Prior to Admission medications   Medication Sig Start Date End Date Taking? Authorizing Provider  hydrOXYzine (ATARAX) 50 MG tablet Take 1 tablet (50 mg total) by mouth 3 (three) times daily as needed for anxiety. 02/28/22   Clapacs, Jackquline Denmark, MD  levETIRAcetam (KEPPRA) 500 MG tablet Take 1 tablet (500 mg total) by mouth 2 (two) times daily. 02/28/22   Clapacs, Jackquline Denmark, MD  QUEtiapine (SEROQUEL) 400 MG tablet Take 1 tablet (400 mg total) by mouth at bedtime. 02/28/22   Clapacs, Jackquline Denmark, MD  temazepam (RESTORIL) 15 MG capsule Take 1 capsule (15 mg total) by mouth at bedtime as needed for sleep. 02/28/22   Clapacs, Jackquline Denmark, MD      Allergies    Patient has no known allergies.    Review of Systems   Review of Systems  Unable to perform ROS: Psychiatric disorder    Physical Exam Updated Vital Signs BP (!) 137/108   Pulse (!) 108   Temp 98.1 F (36.7 C) (Oral)   Resp 20   Ht 5\' 4"  (1.626 m)   Wt 85.7 kg   SpO2 100%   BMI 32.44 kg/m  Physical Exam Vitals and nursing note reviewed.  Constitutional:      General: She is not in acute distress.    Appearance: Normal appearance. She is not ill-appearing or toxic-appearing.  HENT:     Head: Normocephalic and atraumatic.      Mouth/Throat:     Mouth: Mucous membranes are moist.  Eyes:     Conjunctiva/sclera: Conjunctivae normal.  Cardiovascular:     Rate and Rhythm: Normal rate and regular rhythm.     Heart sounds: No murmur heard. Pulmonary:     Effort: Pulmonary effort is normal.     Breath sounds: Normal breath sounds.  Abdominal:     General: Abdomen is flat.     Palpations: Abdomen is soft.     Tenderness: There is no abdominal tenderness.  Musculoskeletal:        General: Normal range of motion.     Cervical back: Normal range of motion and neck supple. No rigidity.     Right lower leg: No edema.     Left lower leg: No edema.  Skin:    General: Skin is warm and dry.     Capillary Refill: Capillary refill takes less than 2 seconds.  Neurological:     General: No focal deficit present.     Mental Status: She is alert.  Psychiatric:        Mood and Affect: Mood is anxious. Affect is labile and blunt.        Speech: Speech is rapid and pressured and tangential.  Behavior: Behavior is cooperative.        Thought Content: Thought content does not include homicidal or suicidal ideation. Thought content does not include homicidal or suicidal plan.        Judgment: Judgment is impulsive.     ED Results / Procedures / Treatments   Labs (all labs ordered are listed, but only abnormal results are displayed) Labs Reviewed  URINALYSIS, ROUTINE W REFLEX MICROSCOPIC - Abnormal; Notable for the following components:      Result Value   APPearance CLOUDY (*)    Leukocytes,Ua LARGE (*)    Bacteria, UA RARE (*)    All other components within normal limits  RAPID URINE DRUG SCREEN, HOSP PERFORMED - Abnormal; Notable for the following components:   Cocaine POSITIVE (*)    Benzodiazepines POSITIVE (*)    All other components within normal limits  COMPREHENSIVE METABOLIC PANEL - Abnormal; Notable for the following components:   Potassium 2.6 (*)    Glucose, Bld 109 (*)    All other components  within normal limits  SALICYLATE LEVEL - Abnormal; Notable for the following components:   Salicylate Lvl <7.0 (*)    All other components within normal limits  ACETAMINOPHEN LEVEL - Abnormal; Notable for the following components:   Acetaminophen (Tylenol), Serum <10 (*)    All other components within normal limits  ETHANOL  CBC  PREGNANCY, URINE  MAGNESIUM    EKG None  Radiology No results found.  Procedures Procedures    Medications Ordered in ED Medications  ziprasidone (GEODON) injection 20 mg (has no administration in time range)    And  LORazepam (ATIVAN) tablet 1 mg (has no administration in time range)  potassium chloride 10 mEq in 100 mL IVPB (10 mEq Intravenous New Bag/Given 04/20/23 1806)  magnesium sulfate IVPB 2 g 50 mL (has no administration in time range)  potassium chloride SA (KLOR-CON M) CR tablet 40 mEq (40 mEq Oral Given 04/20/23 1807)    ED Course/ Medical Decision Making/ A&P                             Medical Decision Making Amount and/or Complexity of Data Reviewed Labs: ordered. Decision-making details documented in ED Course. ECG/medicine tests: ordered. Decision-making details documented in ED Course.  Risk Prescription drug management.   Medical Decision Making:   Misty Howell is a 57 y.o. female who presented to the ED today with IVC detailed above.    Patient's presentation is complicated by their history of bipolar disorder, hyperlipidemia, nonadherence with medication regimen.  Complete initial physical exam performed, notably the patient was in no acute distress.  She did have rapid and pressured speech and was very tangential.  Not answering most questions appropriately.  No obvious focal deficits.  Denies SI or HI.  Nontoxic-appearing.    Reviewed and confirmed nursing documentation for past medical history, family history, social history.    Initial Assessment:   With the patient's presentation, differential diagnosis includes  but is not limited to decompensated bipolar disorder, acute psychosis, metabolic disturbance, infectious process. This is most consistent with an acute complicated illness  Initial Plan:  Screening labs including CBC and Metabolic panel to evaluate for infectious or metabolic etiology of disease.  Urinalysis with reflex culture ordered to evaluate for UTI or relevant urologic/nephrologic pathology.  EKG to evaluate for cardiac pathology Ethanol, salicylate, acetaminophen levels and UDS to assess for substance use Magnesium in  the setting of hypokalemia Objective evaluation as below reviewed   Initial Study Results:   Laboratory  All laboratory results reviewed without evidence of clinically relevant pathology.   Exceptions include: Potassium 2.6, UDS positive for benzodiazepines and cocaine, UA appears contaminated  EKG Independently interpreted by myself.  Sinus tachycardia.  Extreme difficulty obtaining EKG in the setting of mania.  No obvious acute ST-T changes.  Consults: Case discussed with Dr. Mariea Clonts, HPS, who will consult but does not anticipate pt will need medication admission, recommended repeat BMP in AM.   Final Assessment and Plan:     57 year old female presents to the ED under IVC.  She does appear acutely manic.  Vital signs stable.  She is tachycardic but with pressured speech and constantly moving around so suspect this is secondary to activity.  She is nontoxic-appearing.  No evidence of volume losses.  Tolerating p.o. in the ED.  UDS positive for benzodiazepines and cocaine.  Patient has known history of bipolar disorder and reports that she has been nonadherent with her medication regimen.  Likely due to acute stressor in the setting of job loss.  Labs grossly unremarkable apart from a hypokalemia at 2.6.  P.o. potassium given.  Difficulty administering IV potassium due to acute mania but this was ordered and we will continue to attempt.  Magnesium ordered as well to help  with absorption.  UA appears contaminated.  No other significant lab findings.  Pending stabilization of potassium, patient stable for psychiatric evaluation.  Discussed case with hospitalist who does not believe that patient will require medical admission but will evaluate for consultation.  Pending this consult with anticipation that patient will need a repeat metabolic panel in the a.m. and then be medically cleared for psychiatric evaluation.  Patient has remained medically stable throughout ED stay.  Hospitalist place additional orders for potassium, will repeat metabolic panel in the a.m.  Unable to medically clear patient at this time due to hypokalemia but anticipate that she will be medically cleared in the a.m.  Will make oncoming ED team aware of patient should any needs or concerns develop.  Clinical Impression:  1. Bipolar affective disorder, currently manic, moderate (HCC)   2. Hypokalemia   3. Polysubstance abuse (HCC)      Data Unavailable           Final Clinical Impression(s) / ED Diagnoses Final diagnoses:  Hypokalemia  Bipolar affective disorder, currently manic, moderate (HCC)  Polysubstance abuse [facility])    Rx / DC Orders ED Discharge Orders     None         Tonette Lederer, PA-C 04/20/23 1853    Bethann Berkshire, MD 04/21/23 1304

## 2023-04-20 NOTE — ED Notes (Signed)
Pt wanded °

## 2023-04-20 NOTE — ED Notes (Signed)
Pt's mother updated as to patient's status.

## 2023-04-20 NOTE — ED Notes (Signed)
Pt belongings have been placed in locker. Pt is in scrubs. Security called for wanding.

## 2023-04-20 NOTE — ED Notes (Signed)
Faxed IVC papers to [phone].

## 2023-04-20 NOTE — ED Notes (Signed)
Patient brought in by [facility] PD, from Home. Patient being cooperative in triage. Cuffs removed at this time. [place] PD in triage currently. Patient states she took a xanax from a "friend" that she smelled was laced with Cocaine and Fentanyl.

## 2023-04-20 NOTE — ED Notes (Signed)
Pt sister called and requested to make sure staff had her number. Nurse asked pt for approval to give sister information and add her to the chart. Pt approved and stated " my sister is my best friend'.  Sister wanted to make sure pt was not going to be able to just leave and wanted to know what the plan was for her. Sister Angelique Blonder) states pt really has no where to live, no money and has stopped taking her bipolar meds. Angelique Blonder feels pt needs to go to a facility to get her medication under control. Angelique Blonder feels their mother really can not handle pt when she is off her meds. Angelique Blonder also stated pt will have episode of hallucinations and hearing things when she stops her meds. Angelique Blonder states she is available for any questions concerning the pt even though she lives in [place].

## 2023-04-21 ENCOUNTER — Encounter (HOSPITAL_COMMUNITY): Payer: Self-pay

## 2023-04-21 LAB — BASIC METABOLIC PANEL
Anion gap: 8 (ref 5–15)
BUN: 11 mg/dL (ref 6–20)
CO2: 25 mmol/L (ref 22–32)
Calcium: 8.9 mg/dL (ref 8.9–10.3)
Chloride: 103 mmol/L (ref 98–111)
Creatinine, Ser: 0.6 mg/dL (ref 0.44–1.00)
GFR, Estimated: >60 mL/min (ref >60)
Glucose, Bld: 118 mg/dL — ABNORMAL HIGH (ref 70–99)
Potassium: 3.6 mmol/L (ref 3.5–5.1)
Sodium: 136 mmol/L (ref 135–145)

## 2023-04-21 MED ORDER — HYDROXYZINE HCL 25 MG PO TABS: 50.0000 mg | ORAL_TABLET | Freq: Three times a day (TID) | ORAL | Status: DC | PRN

## 2023-04-21 MED ORDER — QUETIAPINE FUMARATE 100 MG PO TABS
400.0000 mg | ORAL_TABLET | Freq: Every day | ORAL | Status: DC
  Administered 2023-04-21: 400 mg via ORAL
  Filled 2023-04-21: qty 4

## 2023-04-21 MED ORDER — GABAPENTIN 400 MG PO CAPS
400.0000 mg | ORAL_CAPSULE | Freq: Three times a day (TID) | ORAL | Status: DC
  Administered 2023-04-21 (×2): 400 mg via ORAL
  Filled 2023-04-21 (×2): qty 1

## 2023-04-21 MED ORDER — QUETIAPINE FUMARATE 100 MG PO TABS: 400.0000 mg | ORAL_TABLET | Freq: Every day | ORAL | Status: DC

## 2023-04-21 MED ORDER — LEVETIRACETAM 500 MG PO TABS
500.0000 mg | ORAL_TABLET | Freq: Two times a day (BID) | ORAL | Status: DC
  Administered 2023-04-21: 500 mg via ORAL
  Filled 2023-04-21: qty 1

## 2023-04-21 MED ORDER — TEMAZEPAM 15 MG PO CAPS: 15.0000 mg | ORAL_CAPSULE | Freq: Every evening | ORAL | Status: DC | PRN

## 2023-04-21 NOTE — ED Notes (Signed)
Lab at bedside

## 2023-04-21 NOTE — ED Notes (Signed)
Pt given bag lunch °

## 2023-04-21 NOTE — ED Notes (Signed)
Report can be called to: [phone] (this is a pager, please leave call-back number when giving report)  Pt can arrive after 8 AM to [facility]

## 2023-04-21 NOTE — ED Notes (Signed)
Pt's mother, Dois Davenport, updated to pt's disposition

## 2023-04-21 NOTE — ED Notes (Signed)
[facility] present to transport pt to [facility]

## 2023-04-21 NOTE — ED Notes (Signed)
Pt received breakfast tray 
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
## 2023-04-21 NOTE — ED Notes (Signed)
Spoke with Corrie Dandy who asked for another fax of Pt IVC paperwork. Secretary sent fax for 2nd time

## 2023-04-21 NOTE — ED Notes (Signed)
Pt states that they were taking Seroquel and gabapentin but has not in a couple of days due to "running out"

## 2023-04-21 NOTE — Progress Notes (Signed)
Pt was accepted to [facility] 04/22/2023, pending IVC paperwork faxed to [phone]. Bed assignment: Main campus  Pt meets inpatient criteria per Assunta Found, NP  Attending Physician will be Loni Beckwith, MD  Report can be called to: [phone] (this is a pager, please leave call-back number when giving report)  Pt can arrive after 8 AM  Care Team Notified: Assunta Found, NP, Rona Ravens, RN, and Rosanna Randy, RN  Mixtli Reno, LCSW  04/21/2023 1:50 PM

## 2023-04-21 NOTE — Progress Notes (Signed)
LCSW Progress Note  [id number]   KIZZI OVERBEY  04/21/2023  1:33 PM  Description:   Inpatient Psychiatric Referral  Patient was recommended inpatient per Assunta Found, NP. There are no available beds at [facility], per [facility] Rona Ravens, RN. Patient was referred to the following out of network facilities:   Destination  Service Provider Address Phone Fax  [facility]  [address] [place] [address] [phone]  [facility] [address]  [address], [place] [address] [phone]  [facility]  [address] [place], [place] [address] [phone]  [facility] [address] [place]  [address], [place] [address] [phone]  [facility]  [address] [place], [place] [address] [phone]  [facility]  [address] [place] [address] [phone]  [facility]  [address] [place] [address] [phone]  [facility]  [address] [place] [address] [phone]  [facility]  [address] [place], [place] [address] [phone]  [facility]  [address] [place] [address] [phone]  [facility]  [address] [place] [address] [phone]  [facility]  [address] [place] [address] [phone]  [facility]  [address], [place] [address] [phone]  [facility]  [address] [place] [address] [phone]  [facility]  [address], [place] [address] [phone]  [facility]  [address], [place] [address] [phone]  [facility]  [address] [place] [address] [phone]  [facility]  [address], [place] [address] [phone]  [facility]  [address], [place] [address] [phone]  [facility]  [address] [place], [place] [address] [phone]  [facility]  [address] [place] [address] [phone]  [facility]  [address], [place] [address] [phone]  [facility]  [address] [place] [address] [phone]  [facility]  [address], [place] [address] [phone]  [facility]  [address], [place] [address] [phone]  [facility]  [address], [place] [address] [phone]  [facility]  [address] [place] [address] [phone]  [facility]  [address], [place] [address] [phone]  [facility]  [address] [place] [address] [phone]  [facility]  [address], [place] [address] [phone]  [facility]  [address] [place] [address] [phone]    Situation ongoing, CSW to continue following and update chart as more information becomes available.      Cathie Beams, [place]  04/21/2023 1:33 PM

## 2023-04-21 NOTE — ED Provider Notes (Signed)
Emergency Medicine Observation Re-evaluation Note  Misty Howell is a 57 y.o. female, seen on rounds today.  Pt initially presented to the ED for complaints of IVC Currently, the patient is asleep.  Physical Exam  BP 122/70 (BP Location: Right Arm)   Pulse 98   Temp 98.1 F (36.7 C) (Oral)   Resp 20   Ht 5\' 4"  (1.626 m)   Wt 85.7 kg   SpO2 100%   BMI 32.44 kg/m  Physical Exam General: asleep Cardiac: asleep Lungs: asleep Psych: asleep  ED Course / MDM  EKG:   I have reviewed the labs performed to date as well as medications administered while in observation.  Recent changes in the last 24 hours include potassium replacement. Potassium normal this morning at 3.6.  Plan  Current plan is for psychiatry consultation. She is medically stable for psychiatric consultation.    Pricilla Loveless, MD 04/21/23 [phone]

## 2023-04-21 NOTE — ED Notes (Signed)
IVC papers faxed to [facility] @ fax # [phone].

## 2023-04-21 NOTE — Discharge Instructions (Addendum)
Transfer to [facility],   dr. Madaram accepting 

## 2023-04-21 NOTE — ED Notes (Addendum)
Pt's mother, Dois Davenport, updated to pt's disposition  Pt given the phone to speak to their mother

## 2023-04-21 NOTE — Progress Notes (Signed)
At 1951 this CSW updated care team that pt was able to transport to [facility] TODAY.  Pt was transported to [facility] on this date, all needed parties have been updated.   Maryjean Ka, MSW, [facility] 04/21/2023 9:39 PM
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
## 2023-04-21 NOTE — ED Notes (Signed)
Pt received lunch tray 

## 2023-06-25 ENCOUNTER — Other Ambulatory Visit (HOSPITAL_COMMUNITY): Payer: Self-pay | Admitting: Adult Health

## 2023-06-25 DIAGNOSIS — Z1231 Encounter for screening mammogram for malignant neoplasm of breast: Secondary | ICD-10-CM

## 2023-07-01 ENCOUNTER — Ambulatory Visit (HOSPITAL_COMMUNITY)
Admission: RE | Admit: 2023-07-01 | Discharge: 2023-07-01 | Disposition: A | Discharge status: Home / Self Care | Payer: MEDICAID | Source: Ambulatory Visit | Attending: Adult Health | Admitting: Adult Health

## 2023-07-01 DIAGNOSIS — Z1231 Encounter for screening mammogram for malignant neoplasm of breast: Secondary | ICD-10-CM | POA: Insufficient documentation

## 2023-07-15 ENCOUNTER — Other Ambulatory Visit (HOSPITAL_COMMUNITY): Payer: Self-pay | Admitting: Adult Health

## 2023-07-15 DIAGNOSIS — R928 Other abnormal and inconclusive findings on diagnostic imaging of breast: Secondary | ICD-10-CM

## 2023-07-21 ENCOUNTER — Ambulatory Visit (HOSPITAL_COMMUNITY): Payer: MEDICAID

## 2023-07-21 ENCOUNTER — Encounter (HOSPITAL_COMMUNITY): Payer: MEDICAID

## 2023-08-04 ENCOUNTER — Other Ambulatory Visit (HOSPITAL_COMMUNITY): Payer: MEDICAID

## 2023-08-04 ENCOUNTER — Encounter (HOSPITAL_COMMUNITY): Payer: MEDICAID

## 2023-08-18 ENCOUNTER — Inpatient Hospital Stay
Admission: RE | Admit: 2023-08-18 | Discharge: 2023-08-18 | Disposition: A | Discharge status: Home / Self Care | Payer: Self-pay | Source: Ambulatory Visit | Attending: Adult Health | Admitting: Adult Health

## 2023-08-18 ENCOUNTER — Ambulatory Visit (HOSPITAL_COMMUNITY)
Admission: RE | Admit: 2023-08-18 | Discharge: 2023-08-18 | Disposition: A | Discharge status: Home / Self Care | Payer: MEDICAID | Source: Ambulatory Visit | Attending: Adult Health | Admitting: Adult Health

## 2023-08-18 ENCOUNTER — Other Ambulatory Visit (HOSPITAL_COMMUNITY): Payer: Self-pay | Admitting: Adult Health

## 2023-08-18 DIAGNOSIS — R928 Other abnormal and inconclusive findings on diagnostic imaging of breast: Secondary | ICD-10-CM

## 2023-10-14 ENCOUNTER — Encounter: Payer: Self-pay | Admitting: *Deleted

## 2023-11-17 ENCOUNTER — Telehealth: Payer: Self-pay | Admitting: *Deleted

## 2023-11-17 NOTE — Telephone Encounter (Signed)
  Procedure: Colonoscopy  Height: 5'4 Weight: 178lbs        Have you had a colonoscopy before?  no  Do you have family history of colon cancer?  no  Do you have a family history of polyps? no  Previous colonoscopy with polyps removed? no  Do you have a history colorectal cancer?   no  Are you diabetic?  no  Do you have a prosthetic or mechanical heart valve? no  Do you have a pacemaker/defibrillator?   no  Have you had endocarditis/atrial fibrillation?  no  Do you use supplemental oxygen/CPAP?  no  Have you had joint replacement within the last 12 months?  no  Do you tend to be constipated or have to use laxatives?  no   Do you have history of alcohol use? If yes, how much and how often.  no  Do you have history or are you using drugs? If yes, what do are you  using?  no  Have you ever had a stroke/heart attack?  no  Have you ever had a heart or other vascular stent placed,?no  Do you take weight loss medication? no  female patients,: have you had a hysterectomy? no                              are you post menopausal?  no                              do you still have your menstrual cycle? no    Date of last menstrual period?  02/20/2020  Do you take any blood-thinning medications such as: (Plavix, aspirin, Coumadin, Aggrenox, Brilinta, Xarelto, Eliquis, Pradaxa, Savaysa or Effient)? no  If yes we need the name, milligram, dosage and who is prescribing doctor:               Current Outpatient Medications  Medication Sig Dispense Refill   divalproex  (DEPAKOTE  ER) 500 MG 24 hr tablet Take 500 mg by mouth daily.     atorvastatin (LIPITOR) 20 MG tablet Take 20 mg by mouth daily.     gabapentin  (NEURONTIN ) 300 MG capsule Take 300 mg by mouth 2 (two) times daily.     levETIRAcetam  (KEPPRA ) 500 MG tablet Take 1 tablet (500 mg total) by mouth 2 (two) times daily. 60 tablet 1   QUEtiapine  (SEROQUEL ) 50 MG tablet Take 50 mg by mouth 2 (two) times daily.     No current  facility-administered medications for this visit.    No Known Allergies

## 2023-12-10 NOTE — Telephone Encounter (Signed)
LMOVM to call back

## 2023-12-14 ENCOUNTER — Telehealth: Payer: Self-pay | Admitting: Internal Medicine

## 2023-12-14 MED ORDER — PEG 3350-KCL-NA BICARB-NACL 420 G PO SOLR: 4000.0000 mL | Freq: Once | ORAL | 0 refills | Status: AC

## 2023-12-14 NOTE — Telephone Encounter (Signed)
Patient left a message that we have been calling her... she is returning the call.

## 2023-12-14 NOTE — Telephone Encounter (Signed)
SEE PRIOR TE

## 2023-12-14 NOTE — Addendum Note (Signed)
Addended by: Armstead Peaks on: 12/14/2023 02:00 PM   Modules accepted: Orders

## 2023-12-14 NOTE — Telephone Encounter (Signed)
Spoke with pt. She is scheduled with Dr. Jena Gauss, 3/6. Aware will send instructions via mail, rx for prep sent to pharmacy.

## 2023-12-15 ENCOUNTER — Encounter (INDEPENDENT_AMBULATORY_CARE_PROVIDER_SITE_OTHER): Payer: Self-pay | Admitting: *Deleted

## 2023-12-15 NOTE — Telephone Encounter (Signed)
 Referral completed, [facility] apt letter sent to PCP

## 2024-01-14 ENCOUNTER — Other Ambulatory Visit: Payer: Self-pay

## 2024-01-14 ENCOUNTER — Ambulatory Visit (HOSPITAL_COMMUNITY)
Admission: RE | Admit: 2024-01-14 | Discharge: 2024-01-14 | Disposition: A | Discharge status: Home / Self Care | Payer: MEDICAID | Attending: Internal Medicine | Admitting: Internal Medicine

## 2024-01-14 ENCOUNTER — Ambulatory Visit (HOSPITAL_COMMUNITY): Payer: MEDICAID | Admitting: Anesthesiology

## 2024-01-14 ENCOUNTER — Encounter (HOSPITAL_COMMUNITY): Payer: Self-pay | Admitting: Internal Medicine

## 2024-01-14 ENCOUNTER — Encounter (HOSPITAL_COMMUNITY)
Admission: RE | Disposition: A | Discharge status: Home / Self Care | Payer: Self-pay | Source: Home / Self Care | Attending: Internal Medicine

## 2024-01-14 ENCOUNTER — Ambulatory Visit (HOSPITAL_BASED_OUTPATIENT_CLINIC_OR_DEPARTMENT_OTHER): Payer: MEDICAID | Admitting: Anesthesiology

## 2024-01-14 DIAGNOSIS — Z6835 Body mass index (BMI) 35.0-35.9, adult: Secondary | ICD-10-CM | POA: Insufficient documentation

## 2024-01-14 DIAGNOSIS — E669 Obesity, unspecified: Secondary | ICD-10-CM | POA: Insufficient documentation

## 2024-01-14 DIAGNOSIS — Z87891 Personal history of nicotine dependence: Secondary | ICD-10-CM | POA: Diagnosis not present

## 2024-01-14 DIAGNOSIS — R7303 Prediabetes: Secondary | ICD-10-CM | POA: Diagnosis not present

## 2024-01-14 DIAGNOSIS — Z1211 Encounter for screening for malignant neoplasm of colon: Secondary | ICD-10-CM | POA: Insufficient documentation

## 2024-01-14 DIAGNOSIS — I10 Essential (primary) hypertension: Secondary | ICD-10-CM | POA: Diagnosis not present

## 2024-01-14 HISTORY — DX: Prediabetes: R73.03

## 2024-01-14 HISTORY — PX: COLONOSCOPY WITH PROPOFOL: SHX5780

## 2024-01-14 LAB — GLUCOSE, CAPILLARY: Glucose-Capillary: 99 mg/dL (ref 70–99)

## 2024-01-14 SURGERY — COLONOSCOPY WITH PROPOFOL
Anesthesia: General

## 2024-01-14 MED ORDER — PROPOFOL 500 MG/50ML IV EMUL
INTRAVENOUS | Status: DC | PRN
  Administered 2024-01-14: 150 ug/kg/min via INTRAVENOUS

## 2024-01-14 MED ORDER — LIDOCAINE HCL (PF) 2 % IJ SOLN
INTRAMUSCULAR | Status: DC | PRN
  Administered 2024-01-14: 50 mg via INTRADERMAL

## 2024-01-14 MED ORDER — PROPOFOL 10 MG/ML IV BOLUS
INTRAVENOUS | Status: DC | PRN
  Administered 2024-01-14: 50 mg via INTRAVENOUS
  Administered 2024-01-14: 100 mg via INTRAVENOUS

## 2024-01-14 MED ORDER — LACTATED RINGERS IV SOLN: INTRAVENOUS | Status: DC

## 2024-01-14 NOTE — H&P (Addendum)
 @LOGO @   Primary Care Physician:  Kara Pacer, NP Primary Gastroenterologist:  Dr. Jena Gauss  Pre-Procedure History & Physical: HPI:  Misty Howell is a 58 y.o. female is here for a screening colonoscopy.  No bowel symptoms.  No family history of colon cancer.  No prior colonoscopy  Past Medical History:  Diagnosis Date   Anxiety    Cancer (HCC)    Giant cell tumor left fifth finger.   Hypercholesterolemia    Overweight(278.02)    Pre-diabetes    Seizures (HCC)    Syncope and collapse    Vision abnormalities    Vitamin D deficiency     Past Surgical History:  Procedure Laterality Date   CESAREAN SECTION     FINGER SURGERY     due to giant cell tumor, hip bone placed in finger    Prior to Admission medications   Medication Sig Start Date End Date Taking? Authorizing Provider  atorvastatin (LIPITOR) 20 MG tablet Take 20 mg by mouth daily.   Yes [provider]  busPIRone (BUSPAR) 5 MG tablet Take 5 mg by mouth 2 (two) times daily.   Yes [provider]  divalproex (DEPAKOTE ER) 500 MG 24 hr tablet Take 500 mg by mouth daily. 09/09/23  Yes [provider]  fenofibrate (TRICOR) 145 MG tablet Take 145 mg by mouth daily.   Yes [provider]  gabapentin (NEURONTIN) 300 MG capsule Take 300 mg by mouth 2 (two) times daily.   Yes [provider]  levETIRAcetam (KEPPRA) 500 MG tablet Take 1 tablet (500 mg total) by mouth 2 (two) times daily. 02/28/22  Yes Clapacs, Jackquline Denmark, MD  metFORMIN (GLUCOPHAGE) 500 MG tablet Take 500 mg by mouth 2 (two) times daily with a meal.   Yes [provider]  QUEtiapine (SEROQUEL) 50 MG tablet Take 50 mg by mouth 2 (two) times daily.   Yes [provider]    Allergies as of 12/14/2023   (No Known Allergies)    Family History  Problem Relation Age of Onset   Hypertension Mother    Migraines Mother    Hypertension Father     Social History   Socioeconomic History    Marital status: Legally Separated    Spouse name: Not on file   Number of children: Not on file   Years of education: Not on file   Highest education level: Not on file  Occupational History   Occupation: Finance  Tobacco Use   Smoking status: Former    Current packs/day: 0.00    Types: Cigarettes    Quit date: 01/20/1986    Years since quitting: 38.0   Smokeless tobacco: Never  Vaping Use   Vaping status: Never Used  Substance and Sexual Activity   Alcohol use: Not Currently    Comment: social   Drug use: Yes    Types: Marijuana    Comment: Pt stated she used Delta 8 a couple weeks ago   Sexual activity: Not Currently  Other Topics Concern   Not on file  Social History Narrative   Pt lives in [place] with mother and two teenaged sons.  Works for a Adult nurse.  Not followed by an outpatient psych provider.   Social Drivers of Corporate investment banker Strain: Not on file  Food Insecurity: Not on file  Transportation Needs: Not on file  Physical Activity: Not on file  Stress: Not on file  Social Connections: Not on file  Intimate  Partner Violence: Not on file    Review of Systems: See HPI, otherwise negative ROS  Physical Exam: BP 118/83   Pulse 88   Temp 97.9 F (36.6 C) (Oral)   Resp 17   Ht 5\' 4"  (1.626 m)   Wt 94.3 kg   SpO2 96%   BMI 35.70 kg/m  General:   Alert,  Well-developed, well-nourished, pleasant and cooperative in NAD Lungs:  Clear throughout to auscultation.   No wheezes, crackles, or rhonchi. No acute distress. Heart:  Regular rate and rhythm; no murmurs, clicks, rubs,  or gallops. Abdomen:  Soft, nontender and nondistended. No masses, hepatosplenomegaly or hernias noted. Normal bowel sounds, without guarding, and without rebound.    Impression/Plan: Misty Howell is now here to undergo a screening colonoscopy.  First ever average risk screening examination  Risks, benefits, limitations, imponderables and alternatives regarding  colonoscopy have been reviewed with the patient. Questions have been answered. All parties agreeable.     Notice:  This dictation was prepared with Dragon dictation along with smaller phrase technology. Any transcriptional errors that result from this process are unintentional and may not be corrected upon review.

## 2024-01-14 NOTE — Discharge Instructions (Signed)
  Colonoscopy Discharge Instructions  Read the instructions outlined below and refer to this sheet in the next few weeks. These discharge instructions provide you with general information on caring for yourself after you leave the hospital. Your doctor may also give you specific instructions. While your treatment has been planned according to the most current medical practices available, unavoidable complications occasionally occur. If you have any problems or questions after discharge, call Dr. Jena Gauss at [phone]. ACTIVITY You may resume your regular activity, but move at a slower pace for the next 24 hours.  Take frequent rest periods for the next 24 hours.  Walking will help get rid of the air and reduce the bloated feeling in your belly (abdomen).  No driving for 24 hours (because of the medicine (anesthesia) used during the test).   Do not sign any important legal documents or operate any machinery for 24 hours (because of the anesthesia used during the test).  NUTRITION Drink plenty of fluids.  You may resume your normal diet as instructed by your doctor.  Begin with a light meal and progress to your normal diet. Heavy or fried foods are harder to digest and may make you feel sick to your stomach (nauseated).  Avoid alcoholic beverages for 24 hours or as instructed.  MEDICATIONS You may resume your normal medications unless your doctor tells you otherwise.  WHAT YOU CAN EXPECT TODAY Some feelings of bloating in the abdomen.  Passage of more gas than usual.  Spotting of blood in your stool or on the toilet paper.  IF YOU HAD POLYPS REMOVED DURING THE COLONOSCOPY: No aspirin products for 7 days or as instructed.  No alcohol for 7 days or as instructed.  Eat a soft diet for the next 24 hours.  FINDING OUT THE RESULTS OF YOUR TEST Not all test results are available during your visit. If your test results are not back during the visit, make an appointment with your caregiver to find out the  results. Do not assume everything is normal if you have not heard from your caregiver or the medical facility. It is important for you to follow up on all of your test results.  SEEK IMMEDIATE MEDICAL ATTENTION IF: You have more than a spotting of blood in your stool.  Your belly is swollen (abdominal distention).  You are nauseated or vomiting.  You have a temperature over 101.  You have abdominal pain or discomfort that is severe or gets worse throughout the day.     Your colonoscopy was normal today  It is recommended you return for repeat screening colonoscopy in 10 years  At patient request, I called Joesphine Bare at [phone] findings and recommendations

## 2024-01-14 NOTE — Transfer of Care (Signed)
 Immediate Anesthesia Transfer of Care Note  Patient: Misty Howell  Procedure(s) Performed: COLONOSCOPY WITH PROPOFOL  Patient Location: Endoscopy Unit  Anesthesia Type:General  Level of Consciousness: drowsy  Airway & Oxygen Therapy: Patient Spontanous Breathing  Post-op Assessment: Report given to RN and Post -op Vital signs reviewed and stable  Post vital signs: Reviewed and stable  Last Vitals:  Vitals Value Taken Time  BP    Temp    Pulse    Resp    SpO2      Last Pain:  Vitals:   01/14/24 1225  TempSrc:   PainSc: 0-No pain      Patients Stated Pain Goal: 7 (01/14/24 1040)  Complications: No notable events documented.

## 2024-01-14 NOTE — Op Note (Signed)
 [facility] Patient Name: Misty Howell Procedure Date: 01/14/2024 11:26 AM MRN: [id number] Date of Birth: [birth year] Attending MD: Gennette Pac , MD, [id number] CSN: [id number] Age: 58 Admit Type: Outpatient Procedure:                Colonoscopy Indications:              Screening for colorectal malignant neoplasm Providers:                Gennette Pac, MD, Francoise Ceo RN, RN,                            Elinor Parkinson Referring MD:              Medicines:                Propofol per Anesthesia Complications:            No immediate complications. Estimated Blood Loss:     Estimated blood loss: none. Procedure:                Pre-Anesthesia Assessment:                           - Prior to the procedure, a History and Physical                            was performed, and patient medications and                            allergies were reviewed. The patient's tolerance of                            previous anesthesia was also reviewed. The risks                            and benefits of the procedure and the sedation                            options and risks were discussed with the patient.                            All questions were answered, and informed consent                            was obtained. Prior Anticoagulants: The patient has                            taken no anticoagulant or antiplatelet agents. ASA                            Grade Assessment: III - A patient with severe                            systemic disease. After reviewing the risks and  benefits, the patient was deemed in satisfactory                            condition to undergo the procedure.                           After obtaining informed consent, the colonoscope                            was passed under direct vision. Throughout the                            procedure, the patient's blood pressure, pulse, and                             oxygen saturations were monitored continuously. The                            [id number] scope was introduced through the                            anus and advanced to the the cecum, identified by                            appendiceal orifice and ileocecal valve. The                            colonoscopy was performed without difficulty. The                            patient tolerated the procedure well. The quality                            of the bowel preparation was adequate. The                            ileocecal valve, appendiceal orifice, and rectum                            were photographed. Scope In: 12:31:14 PM Scope Out: 12:48:55 PM Scope Withdrawal Time: 0 hours 11 minutes 51 seconds  Total Procedure Duration: 0 hours 17 minutes 41 seconds  Findings:      The perianal and digital rectal examinations were normal.      The colon (entire examined portion) appeared normal.      The retroflexed view of the distal rectum and anal verge was normal and       showed no anal or rectal abnormalities. Impression:               - The entire examined colon is normal.                           - The distal rectum and anal verge are normal on  retroflexion view.                           - No specimens collected. Moderate Sedation:      Moderate (conscious) sedation was personally administered by an       anesthesia professional. The following parameters were monitored: oxygen       saturation, heart rate, blood pressure, respiratory rate, EKG, adequacy       of pulmonary ventilation, and response to care. Recommendation:           - Patient has a contact number available for                            emergencies. The signs and symptoms of potential                            delayed complications were discussed with the                            patient. Return to normal activities tomorrow.                            Written discharge  instructions were provided to the                            patient.                           - Advance diet as tolerated.                           - Repeat colonoscopy in 10 years for screening                            purposes.                           - Return to GI office PRN. Procedure Code(s):        --- Professional ---                           [phone], Colonoscopy, flexible; diagnostic, including                            collection of specimen(s) by brushing or washing,                            when performed (separate procedure) Diagnosis Code(s):        --- Professional ---                           Z12.11, Encounter for screening for malignant                            neoplasm of colon CPT copyright 2022 American Medical Association. All rights reserved. The codes documented in this report are preliminary and upon coder review may  be revised to meet current compliance requirements. Gerrit Friends. Lukka Black, MD Gennette Pac, MD 01/14/2024 12:55:45 PM This report has been signed electronically. Number of Addenda: 0

## 2024-01-14 NOTE — Anesthesia Preprocedure Evaluation (Signed)
 Anesthesia Evaluation  Patient identified by MRN, date of birth, ID band Patient awake    Reviewed: Allergy & Precautions, H&P , NPO status , Patient's Chart, lab work & pertinent test results, reviewed documented beta blocker date and time   Airway Mallampati: II  TM Distance: >3 FB Neck ROM: full    Dental no notable dental hx.    Pulmonary neg pulmonary ROS, former smoker   Pulmonary exam normal breath sounds clear to auscultation       Cardiovascular Exercise Tolerance: Good hypertension, negative cardio ROS  Rhythm:regular Rate:Normal     Neuro/Psych Seizures -,  PSYCHIATRIC DISORDERS Anxiety  Bipolar Disorder   negative neurological ROS  negative psych ROS   GI/Hepatic negative GI ROS, Neg liver ROS,,,  Endo/Other  negative endocrine ROS    Renal/GU negative Renal ROS  negative genitourinary   Musculoskeletal   Abdominal   Peds  Hematology negative hematology ROS (+)   Anesthesia Other Findings   Reproductive/Obstetrics negative OB ROS                             Anesthesia Physical Anesthesia Plan  ASA: 2  Anesthesia Plan: General   Post-op Pain Management:    Induction:   PONV Risk Score and Plan: Propofol infusion  Airway Management Planned:   Additional Equipment:   Intra-op Plan:   Post-operative Plan:   Informed Consent: I have reviewed the patients History and Physical, chart, labs and discussed the procedure including the risks, benefits and alternatives for the proposed anesthesia with the patient or authorized representative who has indicated his/her understanding and acceptance.     Dental Advisory Given  Plan Discussed with: CRNA  Anesthesia Plan Comments:        Anesthesia Quick Evaluation

## 2024-01-14 NOTE — Anesthesia Procedure Notes (Addendum)
 Date/Time: 01/14/2024 12:24 PM  Performed by: Julian Reil, CRNAPre-anesthesia Checklist: Patient identified, Emergency Drugs available, Suction available and Patient being monitored Patient Re-evaluated:Patient Re-evaluated prior to induction Oxygen Delivery Method: Nasal cannula Induction Type: IV induction Placement Confirmation: positive ETCO2

## 2024-01-15 ENCOUNTER — Encounter (HOSPITAL_COMMUNITY): Payer: Self-pay | Admitting: Internal Medicine

## 2024-01-18 NOTE — Anesthesia Postprocedure Evaluation (Signed)
 Anesthesia Post Note  Patient: Misty Howell  Procedure(s) Performed: COLONOSCOPY WITH PROPOFOL  Patient location during evaluation: Phase II Anesthesia Type: General Level of consciousness: awake Pain management: pain level controlled Vital Signs Assessment: post-procedure vital signs reviewed and stable Respiratory status: spontaneous breathing and respiratory function stable Cardiovascular status: blood pressure returned to baseline and stable Postop Assessment: no headache and no apparent nausea or vomiting Anesthetic complications: no Comments: Late entry   No notable events documented.   Last Vitals:  Vitals:   01/14/24 1040 01/14/24 1253  BP: 118/83 107/62  Pulse: 88 89  Resp: 17 15  Temp: 36.6 C 36.6 C  SpO2: 96% 97%    Last Pain:  Vitals:   01/14/24 1253  TempSrc: Oral  PainSc: 0-No pain                 Windell Norfolk

## 2024-07-13 ENCOUNTER — Ambulatory Visit (INDEPENDENT_AMBULATORY_CARE_PROVIDER_SITE_OTHER): Payer: MEDICAID | Admitting: Obstetrics and Gynecology

## 2024-07-13 ENCOUNTER — Other Ambulatory Visit (HOSPITAL_COMMUNITY)
Admission: RE | Admit: 2024-07-13 | Discharge: 2024-07-13 | Disposition: A | Discharge status: Home / Self Care | Payer: MEDICAID | Source: Ambulatory Visit | Attending: Obstetrics and Gynecology | Admitting: Obstetrics and Gynecology

## 2024-07-13 ENCOUNTER — Encounter: Payer: Self-pay | Admitting: Obstetrics and Gynecology

## 2024-07-13 VITALS — BP 134/89 | HR 80 | Ht 63.0 in | Wt 240.0 lb

## 2024-07-13 DIAGNOSIS — Z01419 Encounter for gynecological examination (general) (routine) without abnormal findings: Secondary | ICD-10-CM | POA: Diagnosis present

## 2024-07-13 DIAGNOSIS — Z1151 Encounter for screening for human papillomavirus (HPV): Secondary | ICD-10-CM

## 2024-07-13 DIAGNOSIS — Z124 Encounter for screening for malignant neoplasm of cervix: Secondary | ICD-10-CM

## 2024-07-13 NOTE — Progress Notes (Signed)
 ANNUAL EXAM Patient name: Misty Howell MRN [id number]  Date of birth: 1966/07/29 Chief Complaint:   Gynecologic Exam  History of Present Illness:   Misty Howell is a 58 y.o. G66P2001  female being seen today for a routine annual exam.  Current complaints: no vaginal or pelvic complaints.  [facility] [place]   No LMP recorded (lmp unknown). Patient is postmenopausal.   Gynecologic History Patient's last menstrual period was .3- 3 1/2 years  Contraception: n/a Last Pap:few years ago  Last mammogram:08/2023: . Results were:Normal     07/13/2024    1:52 PM 02/03/2013   12:00 PM  Depression screen PHQ 2/9  Decreased Interest 1 0  Down, Depressed, Hopeless 2 0  PHQ - 2 Score 3 0  Altered sleeping 2   Tired, decreased energy 2   Change in appetite 2   Feeling bad or failure about yourself  1   Trouble concentrating 1   Moving slowly or fidgety/restless 1   Suicidal thoughts 0   PHQ-9 Score 12         07/13/2024    1:52 PM  GAD 7 : Generalized Anxiety Score  Nervous, Anxious, on Edge 1  Control/stop worrying 1  Worry too much - different things 1  Trouble relaxing 1  Restless 1  Easily annoyed or irritable 0  Afraid - awful might happen 1  Total GAD 7 Score 6     Review of Systems:   Pertinent items are noted in HPI Denies any headaches, blurred vision, fatigue, shortness of breath, chest pain, abdominal pain, abnormal vaginal discharge/itching/odor/irritation, problems with periods, bowel movements, urination, or intercourse unless otherwise stated above. Pertinent History Reviewed:  Reviewed past medical,surgical, social and family history.  Reviewed problem list, medications and allergies. Physical Assessment:   Vitals:   07/13/24 1355  BP: 134/89  Pulse: 80  Weight: 240 lb (108.9 kg)  Height: 5' 3 (1.6 m)  Body mass index is 42.51 kg/m.        Physical Examination:   General appearance - well appearing, and in no distress  Mental  status - alert, oriented   Psych:  She has a normal mood and affect  Skin - warm and dry, normal color  Chest - effort normal, all lung fields clear to auscultation bilaterally  Heart - normal rate and regular rhythm  Neck:  midline trachea, no thyromegaly or nodules  Breasts - breasts appear normal, no suspicious masses, no skin or nipple changes or  axillary nodes  Abdomen - soft, nontender, nondistended  Pelvic - VULVA: normal appearing vulva with no masses, tenderness or lesions  VAGINA: normal appearing vagina with normal color and discharge, no lesions  CERVIX: normal appearing cervix without discharge or lesions, no CMT  Thin prep pap is done w HR HPV cotesting  UTERUS: uterus is felt to be normal size, shape, consistency and nontender   ADNEXA: No adnexal masses or tenderness noted.  Extremities:  No swelling or varicosities noted  Chaperone present for exam  No results found for this or any previous visit (from the past 24 hours).  Assessment & Plan:  1. Encounter for gynecological examination with Papanicolaou smear of cervix (Primary) Pap today  Routine blood work with PCP Mammogram due October UTD on colonoscopy  2. Cervical cancer screening - Cytology - PAP( [facility])   Labs/procedures today:   Mammogram: due in October , or sooner if problems Colonoscopy: per GI, or sooner if problems  No  orders of the defined types were placed in this encounter.   Meds: No orders of the defined types were placed in this encounter.   Follow-up: No follow-ups on file.  Nidia Daring, FNP

## 2024-07-19 LAB — CYTOLOGY - PAP
Comment: NEGATIVE
Diagnosis: NEGATIVE
High risk HPV: NEGATIVE

## 2024-07-24 ENCOUNTER — Ambulatory Visit: Payer: Self-pay | Admitting: Obstetrics and Gynecology

## 2024-08-16 ENCOUNTER — Other Ambulatory Visit (HOSPITAL_COMMUNITY): Payer: Self-pay | Admitting: Adult Health

## 2024-08-16 DIAGNOSIS — Z1231 Encounter for screening mammogram for malignant neoplasm of breast: Secondary | ICD-10-CM

## 2024-08-18 ENCOUNTER — Ambulatory Visit (HOSPITAL_COMMUNITY)
Admission: RE | Admit: 2024-08-18 | Discharge: 2024-08-18 | Disposition: A | Discharge status: Home / Self Care | Payer: MEDICAID | Source: Ambulatory Visit | Attending: Adult Health | Admitting: Adult Health

## 2024-08-18 DIAGNOSIS — Z1231 Encounter for screening mammogram for malignant neoplasm of breast: Secondary | ICD-10-CM | POA: Diagnosis present

## 2024-09-02 ENCOUNTER — Telehealth: Payer: Self-pay

## 2024-09-02 NOTE — Telephone Encounter (Signed)
 Patient left vm to call her. I returned her call and her vm hasn't been set up yet so I couldn't leave a message.
